# Patient Record
Sex: Male | Born: 1935 | Race: Black or African American | Hispanic: No | Marital: Married | Smoking: Current every day smoker
Health system: Southern US, Community
[De-identification: ages and names within clinical notes are randomized; demographics above are authoritative.]

## PROBLEM LIST (undated history)

## (undated) DIAGNOSIS — D509 Iron deficiency anemia, unspecified: Secondary | ICD-10-CM

## (undated) DIAGNOSIS — K219 Gastro-esophageal reflux disease without esophagitis: Secondary | ICD-10-CM

## (undated) DIAGNOSIS — I2510 Atherosclerotic heart disease of native coronary artery without angina pectoris: Secondary | ICD-10-CM

## (undated) DIAGNOSIS — I10 Essential (primary) hypertension: Secondary | ICD-10-CM

## (undated) DIAGNOSIS — M479 Spondylosis, unspecified: Secondary | ICD-10-CM

## (undated) DIAGNOSIS — Z9889 Other specified postprocedural states: Secondary | ICD-10-CM

## (undated) DIAGNOSIS — E069 Thyroiditis, unspecified: Secondary | ICD-10-CM

## (undated) DIAGNOSIS — I739 Peripheral vascular disease, unspecified: Secondary | ICD-10-CM

## (undated) DIAGNOSIS — D649 Anemia, unspecified: Secondary | ICD-10-CM

## (undated) DIAGNOSIS — M79606 Pain in leg, unspecified: Secondary | ICD-10-CM

## (undated) DIAGNOSIS — Z9289 Personal history of other medical treatment: Secondary | ICD-10-CM

## (undated) DIAGNOSIS — Z860100 Personal history of colon polyps, unspecified: Secondary | ICD-10-CM

## (undated) DIAGNOSIS — E1049 Type 1 diabetes mellitus with other diabetic neurological complication: Secondary | ICD-10-CM

## (undated) DIAGNOSIS — M7020 Olecranon bursitis, unspecified elbow: Secondary | ICD-10-CM

## (undated) DIAGNOSIS — I639 Cerebral infarction, unspecified: Secondary | ICD-10-CM

## (undated) DIAGNOSIS — N189 Chronic kidney disease, unspecified: Secondary | ICD-10-CM

## (undated) DIAGNOSIS — E7800 Pure hypercholesterolemia, unspecified: Secondary | ICD-10-CM

## (undated) DIAGNOSIS — E079 Disorder of thyroid, unspecified: Secondary | ICD-10-CM

## (undated) DIAGNOSIS — R200 Anesthesia of skin: Secondary | ICD-10-CM

## (undated) HISTORY — DX: Chronic kidney disease, unspecified: N18.9

## (undated) HISTORY — DX: Anesthesia of skin: R20.0

## (undated) HISTORY — DX: Olecranon bursitis, unspecified elbow: M70.20

## (undated) HISTORY — DX: Essential (primary) hypertension: I10

## (undated) HISTORY — DX: Personal history of colon polyps, unspecified: Z86.0100

## (undated) HISTORY — DX: Anemia, unspecified: D64.9

## (undated) HISTORY — DX: Cerebral infarction, unspecified: I63.9

## (undated) HISTORY — DX: Spondylosis, unspecified: M47.9

## (undated) HISTORY — DX: Pain in leg, unspecified: M79.606

## (undated) HISTORY — DX: Atherosclerotic heart disease of native coronary artery without angina pectoris: I25.10

## (undated) HISTORY — DX: Gastro-esophageal reflux disease without esophagitis: K21.9

## (undated) HISTORY — DX: Peripheral vascular disease, unspecified: I73.9

## (undated) HISTORY — DX: Other specified postprocedural states: Z98.890

## (undated) HISTORY — PX: EYE SURGERY: SHX253

## (undated) HISTORY — DX: Iron deficiency anemia, unspecified: D50.9

## (undated) HISTORY — PX: APPENDECTOMY: SHX54

## (undated) HISTORY — DX: Disorder of thyroid, unspecified: E07.9

## (undated) HISTORY — DX: Pure hypercholesterolemia, unspecified: E78.00

## (undated) HISTORY — DX: Personal history of colonic polyps: Z86.010

## (undated) HISTORY — DX: Thyroiditis, unspecified: E06.9

## (undated) HISTORY — DX: Type 1 diabetes mellitus with other diabetic neurological complication: E10.49

---

## 1998-04-30 ENCOUNTER — Ambulatory Visit (HOSPITAL_COMMUNITY): Admission: RE | Admit: 1998-04-30 | Discharge: 1998-04-30 | Payer: Self-pay | Admitting: Gastroenterology

## 2000-04-25 HISTORY — PX: PR VEIN BYPASS GRAFT,AORTO-FEM-POP: 35551

## 2000-04-25 HISTORY — PX: OTHER SURGICAL HISTORY: SHX169

## 2000-10-11 ENCOUNTER — Encounter: Payer: Self-pay | Admitting: *Deleted

## 2000-10-11 ENCOUNTER — Inpatient Hospital Stay (HOSPITAL_COMMUNITY): Admission: RE | Admit: 2000-10-11 | Discharge: 2000-10-13 | Payer: Self-pay | Admitting: *Deleted

## 2000-11-10 ENCOUNTER — Ambulatory Visit (HOSPITAL_COMMUNITY): Admission: RE | Admit: 2000-11-10 | Discharge: 2000-11-10 | Payer: Self-pay | Admitting: *Deleted

## 2000-11-17 ENCOUNTER — Encounter (INDEPENDENT_AMBULATORY_CARE_PROVIDER_SITE_OTHER): Payer: Self-pay | Admitting: Specialist

## 2000-11-17 ENCOUNTER — Inpatient Hospital Stay (HOSPITAL_COMMUNITY): Admission: RE | Admit: 2000-11-17 | Discharge: 2000-11-22 | Payer: Self-pay | Admitting: Vascular Surgery

## 2000-11-17 ENCOUNTER — Encounter: Payer: Self-pay | Admitting: Vascular Surgery

## 2000-11-18 ENCOUNTER — Encounter: Payer: Self-pay | Admitting: Vascular Surgery

## 2001-04-25 DIAGNOSIS — I2510 Atherosclerotic heart disease of native coronary artery without angina pectoris: Secondary | ICD-10-CM

## 2001-04-25 HISTORY — DX: Atherosclerotic heart disease of native coronary artery without angina pectoris: I25.10

## 2002-02-04 ENCOUNTER — Inpatient Hospital Stay (HOSPITAL_COMMUNITY): Admission: EM | Admit: 2002-02-04 | Discharge: 2002-02-06 | Payer: Self-pay | Admitting: Emergency Medicine

## 2002-02-04 ENCOUNTER — Encounter: Payer: Self-pay | Admitting: Internal Medicine

## 2002-02-22 ENCOUNTER — Ambulatory Visit (HOSPITAL_COMMUNITY): Admission: RE | Admit: 2002-02-22 | Discharge: 2002-02-22 | Payer: Self-pay | Admitting: Gastroenterology

## 2002-12-04 ENCOUNTER — Encounter: Admission: RE | Admit: 2002-12-04 | Discharge: 2002-12-04 | Payer: Self-pay | Admitting: Endocrinology

## 2002-12-04 ENCOUNTER — Encounter: Payer: Self-pay | Admitting: Endocrinology

## 2003-04-14 HISTORY — PX: OTHER SURGICAL HISTORY: SHX169

## 2003-09-17 ENCOUNTER — Inpatient Hospital Stay (HOSPITAL_COMMUNITY): Admission: AD | Admit: 2003-09-17 | Discharge: 2003-09-18 | Payer: Self-pay | Admitting: Internal Medicine

## 2004-01-12 ENCOUNTER — Ambulatory Visit (HOSPITAL_COMMUNITY): Admission: RE | Admit: 2004-01-12 | Discharge: 2004-01-12 | Payer: Self-pay | Admitting: Endocrinology

## 2004-06-21 ENCOUNTER — Ambulatory Visit: Payer: Self-pay | Admitting: *Deleted

## 2004-07-08 ENCOUNTER — Ambulatory Visit: Payer: Self-pay | Admitting: Endocrinology

## 2004-07-13 ENCOUNTER — Ambulatory Visit: Payer: Self-pay | Admitting: Endocrinology

## 2004-07-16 ENCOUNTER — Encounter: Admission: RE | Admit: 2004-07-16 | Discharge: 2004-10-14 | Payer: Self-pay | Admitting: Endocrinology

## 2004-07-27 ENCOUNTER — Ambulatory Visit: Payer: Self-pay | Admitting: Endocrinology

## 2004-08-11 ENCOUNTER — Ambulatory Visit: Payer: Self-pay | Admitting: Endocrinology

## 2004-09-10 ENCOUNTER — Ambulatory Visit: Payer: Self-pay | Admitting: Endocrinology

## 2004-10-12 ENCOUNTER — Ambulatory Visit: Payer: Self-pay | Admitting: Endocrinology

## 2004-10-28 ENCOUNTER — Ambulatory Visit: Payer: Self-pay | Admitting: Endocrinology

## 2004-11-11 ENCOUNTER — Ambulatory Visit: Payer: Self-pay | Admitting: Endocrinology

## 2004-12-08 ENCOUNTER — Ambulatory Visit: Payer: Self-pay | Admitting: Endocrinology

## 2005-01-04 ENCOUNTER — Ambulatory Visit: Payer: Self-pay | Admitting: Endocrinology

## 2005-01-10 ENCOUNTER — Ambulatory Visit: Payer: Self-pay | Admitting: Endocrinology

## 2005-01-12 ENCOUNTER — Ambulatory Visit: Payer: Self-pay | Admitting: Cardiology

## 2005-01-20 ENCOUNTER — Ambulatory Visit: Payer: Self-pay | Admitting: Endocrinology

## 2005-01-24 ENCOUNTER — Ambulatory Visit: Payer: Self-pay | Admitting: Endocrinology

## 2005-09-06 ENCOUNTER — Ambulatory Visit: Payer: Self-pay | Admitting: Endocrinology

## 2005-10-11 ENCOUNTER — Ambulatory Visit: Payer: Self-pay | Admitting: Endocrinology

## 2006-01-12 ENCOUNTER — Ambulatory Visit: Payer: Self-pay | Admitting: Endocrinology

## 2006-01-12 HISTORY — PX: ELECTROCARDIOGRAM: SHX264

## 2006-06-22 ENCOUNTER — Ambulatory Visit: Payer: Self-pay | Admitting: Endocrinology

## 2006-07-03 ENCOUNTER — Ambulatory Visit: Payer: Self-pay | Admitting: Endocrinology

## 2006-07-03 LAB — CONVERTED CEMR LAB
BUN: 25 mg/dL — ABNORMAL HIGH (ref 6–23)
Basophils Absolute: 0.1 10*3/uL (ref 0.0–0.1)
CO2: 28 meq/L (ref 19–32)
Calcium: 9 mg/dL (ref 8.4–10.5)
Creatinine, Ser: 1 mg/dL (ref 0.4–1.5)
Eosinophils Relative: 2.2 % (ref 0.0–5.0)
GFR calc non Af Amer: 78 mL/min
Glucose, Bld: 220 mg/dL — ABNORMAL HIGH (ref 70–99)
HCT: 34.4 % — ABNORMAL LOW (ref 39.0–52.0)
Hemoglobin: 11.5 g/dL — ABNORMAL LOW (ref 13.0–17.0)
MCHC: 33.5 g/dL (ref 30.0–36.0)
Monocytes Absolute: 1 10*3/uL — ABNORMAL HIGH (ref 0.2–0.7)
Neutro Abs: 6.2 10*3/uL (ref 1.4–7.7)
Neutrophils Relative %: 70.2 % (ref 43.0–77.0)
Potassium: 4.3 meq/L (ref 3.5–5.1)
Sodium: 142 meq/L (ref 135–145)
WBC: 8.9 10*3/uL (ref 4.5–10.5)

## 2006-07-06 ENCOUNTER — Encounter: Admission: RE | Admit: 2006-07-06 | Discharge: 2006-07-06 | Payer: Self-pay | Admitting: Endocrinology

## 2006-07-12 ENCOUNTER — Ambulatory Visit: Payer: Self-pay | Admitting: Endocrinology

## 2006-08-15 ENCOUNTER — Ambulatory Visit: Payer: Self-pay | Admitting: Endocrinology

## 2006-08-15 LAB — CONVERTED CEMR LAB
Basophils Absolute: 0 10*3/uL (ref 0.0–0.1)
Hemoglobin: 11.4 g/dL — ABNORMAL LOW (ref 13.0–17.0)
Iron: 24 ug/dL — ABNORMAL LOW (ref 42–165)
Lymphocytes Relative: 15.6 % (ref 12.0–46.0)
MCHC: 33 g/dL (ref 30.0–36.0)
Monocytes Absolute: 0.6 10*3/uL (ref 0.2–0.7)
Monocytes Relative: 7.6 % (ref 3.0–11.0)
Neutro Abs: 6.3 10*3/uL (ref 1.4–7.7)
Platelets: 236 10*3/uL (ref 150–400)
RDW: 18.3 % — ABNORMAL HIGH (ref 11.5–14.6)
Transferrin: 282 mg/dL (ref >=212.0)

## 2006-09-05 ENCOUNTER — Ambulatory Visit: Payer: Self-pay | Admitting: Endocrinology

## 2006-10-25 ENCOUNTER — Encounter: Payer: Self-pay | Admitting: Endocrinology

## 2006-10-25 DIAGNOSIS — Z860100 Personal history of colon polyps, unspecified: Secondary | ICD-10-CM | POA: Insufficient documentation

## 2006-10-25 DIAGNOSIS — I739 Peripheral vascular disease, unspecified: Secondary | ICD-10-CM

## 2006-10-25 DIAGNOSIS — K219 Gastro-esophageal reflux disease without esophagitis: Secondary | ICD-10-CM

## 2006-11-18 ENCOUNTER — Encounter: Admission: RE | Admit: 2006-11-18 | Discharge: 2006-11-18 | Payer: Self-pay | Admitting: Neurology

## 2006-12-15 ENCOUNTER — Ambulatory Visit: Payer: Self-pay | Admitting: Endocrinology

## 2006-12-15 LAB — CONVERTED CEMR LAB
Albumin: 3.5 g/dL (ref 3.5–5.2)
Basophils Absolute: 0 10*3/uL (ref 0.0–0.1)
Basophils Relative: 0.4 % (ref 0.0–1.0)
Bilirubin, Direct: 0.1 mg/dL (ref 0.0–0.3)
CO2: 20 meq/L (ref 19–32)
Chloride: 111 meq/L (ref 96–112)
Creatinine, Ser: 1.2 mg/dL (ref 0.4–1.5)
Eosinophils Absolute: 0.2 10*3/uL (ref 0.0–0.6)
GFR calc Af Amer: 76 mL/min
GFR calc non Af Amer: 63 mL/min
HCT: 31.9 % — ABNORMAL LOW (ref 39.0–52.0)
Hemoglobin: 10.4 g/dL — ABNORMAL LOW (ref 13.0–17.0)
Hgb A1c MFr Bld: 7.8 % — ABNORMAL HIGH (ref 4.6–6.0)
Iron: 92 ug/dL (ref 42–165)
Lymphocytes Relative: 19.2 % (ref 12.0–46.0)
MCHC: 32.7 g/dL (ref 30.0–36.0)
MCV: 84.4 fL (ref 78.0–100.0)
Monocytes Absolute: 0.7 10*3/uL (ref 0.2–0.7)
Neutro Abs: 5.2 10*3/uL (ref 1.4–7.7)
Neutrophils Relative %: 68.6 % (ref 43.0–77.0)
Platelets: 199 10*3/uL (ref 150–400)
Potassium: 4.2 meq/L (ref 3.5–5.1)
RBC: 3.78 M/uL — ABNORMAL LOW (ref 4.22–5.81)
Saturation Ratios: 22.9 % (ref 20.0–50.0)
Sodium: 139 meq/L (ref 135–145)
Total Bilirubin: 0.7 mg/dL (ref 0.3–1.2)
Transferrin: 287.1 mg/dL (ref >=212.0)

## 2006-12-29 ENCOUNTER — Ambulatory Visit: Payer: Self-pay | Admitting: Endocrinology

## 2007-01-05 ENCOUNTER — Ambulatory Visit: Payer: Self-pay

## 2007-01-19 ENCOUNTER — Ambulatory Visit: Payer: Self-pay | Admitting: Cardiology

## 2007-01-26 ENCOUNTER — Ambulatory Visit: Payer: Self-pay | Admitting: Endocrinology

## 2007-03-20 ENCOUNTER — Ambulatory Visit: Payer: Self-pay | Admitting: Endocrinology

## 2007-03-20 DIAGNOSIS — I10 Essential (primary) hypertension: Secondary | ICD-10-CM | POA: Insufficient documentation

## 2007-03-20 DIAGNOSIS — E1049 Type 1 diabetes mellitus with other diabetic neurological complication: Secondary | ICD-10-CM

## 2007-03-21 LAB — CONVERTED CEMR LAB
Basophils Absolute: 0 10*3/uL (ref 0.0–0.1)
Eosinophils Absolute: 0.2 10*3/uL (ref 0.0–0.6)
Eosinophils Relative: 2.1 % (ref 0.0–5.0)
Iron: 27 ug/dL — ABNORMAL LOW (ref 42–165)
MCV: 82 fL (ref 78.0–100.0)
Platelets: 224 10*3/uL (ref 150–400)
RBC: 3.77 M/uL — ABNORMAL LOW (ref 4.22–5.81)
Transferrin: 328.7 mg/dL (ref >=212.0)
WBC: 7.3 10*3/uL (ref 4.5–10.5)

## 2007-07-18 ENCOUNTER — Ambulatory Visit: Payer: Self-pay | Admitting: Endocrinology

## 2007-07-18 DIAGNOSIS — D509 Iron deficiency anemia, unspecified: Secondary | ICD-10-CM

## 2007-07-18 LAB — CONVERTED CEMR LAB
Hemoglobin: 12 g/dL — ABNORMAL LOW (ref 13.0–17.0)
Hgb A1c MFr Bld: 7.7 % — ABNORMAL HIGH (ref 4.6–6.0)
Iron: 62 ug/dL (ref 42–165)
Lymphocytes Relative: 17.1 % (ref 12.0–46.0)
Monocytes Relative: 9.4 % (ref 3.0–11.0)
PSA: 1.15 ng/mL (ref 0.10–4.00)
Platelets: 238 10*3/uL (ref 150–400)
RDW: 18.7 % — ABNORMAL HIGH (ref 11.5–14.6)
Saturation Ratios: 18.1 % — ABNORMAL LOW (ref 20.0–50.0)
Transferrin: 244.1 mg/dL (ref >=212.0)

## 2007-09-10 ENCOUNTER — Encounter: Payer: Self-pay | Admitting: Endocrinology

## 2007-09-15 ENCOUNTER — Encounter: Admission: RE | Admit: 2007-09-15 | Discharge: 2007-09-15 | Payer: Self-pay | Admitting: Orthopedic Surgery

## 2007-10-02 ENCOUNTER — Emergency Department (HOSPITAL_COMMUNITY): Admission: EM | Admit: 2007-10-02 | Discharge: 2007-10-02 | Payer: Self-pay | Admitting: Emergency Medicine

## 2007-11-15 ENCOUNTER — Ambulatory Visit: Payer: Self-pay | Admitting: Endocrinology

## 2007-11-15 DIAGNOSIS — F1010 Alcohol abuse, uncomplicated: Secondary | ICD-10-CM | POA: Insufficient documentation

## 2007-11-15 LAB — CONVERTED CEMR LAB
Hgb A1c MFr Bld: 8.6 % — ABNORMAL HIGH (ref 4.6–6.0)
Iron: 32 ug/dL — ABNORMAL LOW (ref 42–165)
Lymphocytes Relative: 22.3 % (ref 12.0–46.0)
Monocytes Absolute: 0.5 10*3/uL (ref 0.1–1.0)
Monocytes Relative: 9.2 % (ref 3.0–12.0)
Platelets: 205 10*3/uL (ref 150–400)
RDW: 16 % — ABNORMAL HIGH (ref 11.5–14.6)
TSH: 2.08 microintl units/mL (ref 0.35–5.50)
Transferrin: 256.4 mg/dL (ref >=212.0)

## 2007-12-25 ENCOUNTER — Ambulatory Visit: Payer: Self-pay | Admitting: Cardiology

## 2008-02-13 ENCOUNTER — Ambulatory Visit: Payer: Self-pay | Admitting: Endocrinology

## 2008-02-13 LAB — CONVERTED CEMR LAB
ALT: 62 units/L — ABNORMAL HIGH (ref 0–53)
Alkaline Phosphatase: 104 units/L (ref 39–117)
Bilirubin, Direct: 0.2 mg/dL (ref 0.0–0.3)
CO2: 25 meq/L (ref 19–32)
Chloride: 110 meq/L (ref 96–112)
Eosinophils Relative: 2.5 % (ref 0.0–5.0)
Glucose, Bld: 132 mg/dL — ABNORMAL HIGH (ref 70–99)
HCT: 38.4 % — ABNORMAL LOW (ref 39.0–52.0)
HDL: 60.7 mg/dL (ref >39.0)
Hemoglobin, Urine: NEGATIVE
Hgb A1c MFr Bld: 9.1 % — ABNORMAL HIGH (ref 4.6–6.0)
LDL Cholesterol: 62 mg/dL (ref 0–99)
Lymphocytes Relative: 24.4 % (ref 12.0–46.0)
Microalb, Ur: 5.7 mg/dL — ABNORMAL HIGH (ref 0.0–1.9)
Monocytes Absolute: 0.9 10*3/uL (ref 0.1–1.0)
Monocytes Relative: 10.6 % (ref 3.0–12.0)
Nitrite: NEGATIVE
Platelets: 173 10*3/uL (ref 150–400)
Potassium: 4.1 meq/L (ref 3.5–5.1)
Saturation Ratios: 35.6 % (ref 20.0–50.0)
Sodium: 141 meq/L (ref 135–145)
Specific Gravity, Urine: 1.02 (ref 1.000–1.03)
Total Bilirubin: 1.1 mg/dL (ref 0.3–1.2)
Total CHOL/HDL Ratio: 2.2
Total Protein, Urine: NEGATIVE mg/dL
Total Protein: 7 g/dL (ref 6.0–8.3)
Transferrin: 246.8 mg/dL (ref >=212.0)
Urine Glucose: NEGATIVE mg/dL
WBC: 8.1 10*3/uL (ref 4.5–10.5)
pH: 5.5 (ref 5.0–8.0)

## 2008-03-14 ENCOUNTER — Ambulatory Visit: Payer: Self-pay | Admitting: Endocrinology

## 2008-03-14 DIAGNOSIS — M7020 Olecranon bursitis, unspecified elbow: Secondary | ICD-10-CM | POA: Insufficient documentation

## 2008-03-24 ENCOUNTER — Encounter: Payer: Self-pay | Admitting: Endocrinology

## 2008-03-25 ENCOUNTER — Encounter: Admission: RE | Admit: 2008-03-25 | Discharge: 2008-03-25 | Payer: Self-pay | Admitting: Endocrinology

## 2008-04-04 ENCOUNTER — Ambulatory Visit: Payer: Self-pay | Admitting: Endocrinology

## 2008-04-04 DIAGNOSIS — E069 Thyroiditis, unspecified: Secondary | ICD-10-CM | POA: Insufficient documentation

## 2008-05-15 ENCOUNTER — Ambulatory Visit: Payer: Self-pay | Admitting: Endocrinology

## 2008-05-15 DIAGNOSIS — R209 Unspecified disturbances of skin sensation: Secondary | ICD-10-CM | POA: Insufficient documentation

## 2008-05-15 DIAGNOSIS — E7800 Pure hypercholesterolemia, unspecified: Secondary | ICD-10-CM

## 2008-05-15 LAB — CONVERTED CEMR LAB
Folate: 14.1 ng/mL
Hgb A1c MFr Bld: 8.2 % — ABNORMAL HIGH (ref 4.6–6.0)
Iron: 68 ug/dL (ref 42–165)

## 2008-08-07 ENCOUNTER — Encounter: Payer: Self-pay | Admitting: Endocrinology

## 2008-08-20 ENCOUNTER — Ambulatory Visit: Payer: Self-pay | Admitting: Endocrinology

## 2008-08-20 DIAGNOSIS — M79609 Pain in unspecified limb: Secondary | ICD-10-CM

## 2008-08-20 DIAGNOSIS — M479 Spondylosis, unspecified: Secondary | ICD-10-CM | POA: Insufficient documentation

## 2008-08-20 LAB — CONVERTED CEMR LAB
Basophils Relative: 0.2 % (ref 0.0–3.0)
Eosinophils Relative: 1.3 % (ref 0.0–5.0)
Hemoglobin: 12.3 g/dL — ABNORMAL LOW (ref 13.0–17.0)
Hgb A1c MFr Bld: 8 % — ABNORMAL HIGH (ref 4.6–6.5)
Iron: 38 ug/dL — ABNORMAL LOW (ref 42–165)
Lymphocytes Relative: 15 % (ref 12.0–46.0)
MCHC: 32.2 g/dL (ref 30.0–36.0)
Neutro Abs: 6.6 10*3/uL (ref 1.4–7.7)
Neutrophils Relative %: 75.6 % (ref 43.0–77.0)
RBC: 4.36 M/uL (ref 4.22–5.81)
Saturation Ratios: 11.9 % — ABNORMAL LOW (ref 20.0–50.0)
Transferrin: 228 mg/dL (ref 212.0–360.0)
WBC: 8.7 10*3/uL (ref 4.5–10.5)

## 2008-08-21 ENCOUNTER — Encounter: Admission: RE | Admit: 2008-08-21 | Discharge: 2008-08-21 | Payer: Self-pay | Admitting: Orthopedic Surgery

## 2008-08-27 ENCOUNTER — Encounter: Payer: Self-pay | Admitting: Endocrinology

## 2008-08-27 ENCOUNTER — Ambulatory Visit: Payer: Self-pay

## 2008-09-05 ENCOUNTER — Telehealth: Payer: Self-pay | Admitting: Endocrinology

## 2008-09-09 ENCOUNTER — Encounter: Payer: Self-pay | Admitting: Endocrinology

## 2008-11-17 ENCOUNTER — Encounter (INDEPENDENT_AMBULATORY_CARE_PROVIDER_SITE_OTHER): Payer: Self-pay | Admitting: *Deleted

## 2008-11-26 ENCOUNTER — Ambulatory Visit: Payer: Self-pay | Admitting: Endocrinology

## 2008-11-26 LAB — CONVERTED CEMR LAB
Basophils Relative: 0.8 % (ref 0.0–3.0)
Eosinophils Relative: 0.9 % (ref 0.0–5.0)
Hgb A1c MFr Bld: 7.6 % — ABNORMAL HIGH (ref 4.6–6.5)
Iron: 151 ug/dL (ref 42–165)
Lymphocytes Relative: 17.2 % (ref 12.0–46.0)
Monocytes Absolute: 0.8 10*3/uL (ref 0.1–1.0)
Monocytes Relative: 10.6 % (ref 3.0–12.0)
Neutrophils Relative %: 70.5 % (ref 43.0–77.0)
Platelets: 202 10*3/uL (ref 150.0–400.0)
RBC: 4.39 M/uL (ref 4.22–5.81)
Saturation Ratios: 48 % (ref 20.0–50.0)
Transferrin: 224.8 mg/dL (ref 212.0–360.0)
WBC: 8 10*3/uL (ref 4.5–10.5)

## 2008-11-27 ENCOUNTER — Telehealth (INDEPENDENT_AMBULATORY_CARE_PROVIDER_SITE_OTHER): Payer: Self-pay | Admitting: *Deleted

## 2008-12-18 DIAGNOSIS — I2510 Atherosclerotic heart disease of native coronary artery without angina pectoris: Secondary | ICD-10-CM | POA: Insufficient documentation

## 2008-12-19 ENCOUNTER — Ambulatory Visit: Payer: Self-pay | Admitting: Cardiovascular Disease

## 2008-12-19 DIAGNOSIS — F17200 Nicotine dependence, unspecified, uncomplicated: Secondary | ICD-10-CM | POA: Insufficient documentation

## 2009-01-13 ENCOUNTER — Telehealth (INDEPENDENT_AMBULATORY_CARE_PROVIDER_SITE_OTHER): Payer: Self-pay | Admitting: *Deleted

## 2009-01-23 DIAGNOSIS — I739 Peripheral vascular disease, unspecified: Secondary | ICD-10-CM

## 2009-01-23 HISTORY — DX: Peripheral vascular disease, unspecified: I73.9

## 2009-01-27 ENCOUNTER — Ambulatory Visit: Payer: Self-pay | Admitting: Vascular Surgery

## 2009-01-27 ENCOUNTER — Encounter: Payer: Self-pay | Admitting: Endocrinology

## 2009-02-05 ENCOUNTER — Ambulatory Visit: Payer: Self-pay | Admitting: Surgery

## 2009-02-05 ENCOUNTER — Ambulatory Visit (HOSPITAL_COMMUNITY): Admission: RE | Admit: 2009-02-05 | Discharge: 2009-02-05 | Payer: Self-pay | Admitting: Surgery

## 2009-02-09 ENCOUNTER — Encounter: Payer: Self-pay | Admitting: Endocrinology

## 2009-02-12 ENCOUNTER — Inpatient Hospital Stay (HOSPITAL_COMMUNITY): Admission: EM | Admit: 2009-02-12 | Discharge: 2009-02-25 | Payer: Self-pay | Admitting: Emergency Medicine

## 2009-02-12 ENCOUNTER — Telehealth: Payer: Self-pay | Admitting: Endocrinology

## 2009-02-28 ENCOUNTER — Emergency Department (HOSPITAL_COMMUNITY): Admission: EM | Admit: 2009-02-28 | Discharge: 2009-02-28 | Payer: Self-pay | Admitting: Family Medicine

## 2009-03-03 ENCOUNTER — Ambulatory Visit: Payer: Self-pay | Admitting: Endocrinology

## 2009-03-03 ENCOUNTER — Ambulatory Visit: Admission: RE | Admit: 2009-03-03 | Discharge: 2009-03-03 | Payer: Self-pay | Admitting: Endocrinology

## 2009-03-03 ENCOUNTER — Ambulatory Visit: Payer: Self-pay | Admitting: Vascular Surgery

## 2009-03-03 ENCOUNTER — Encounter: Payer: Self-pay | Admitting: Endocrinology

## 2009-03-03 DIAGNOSIS — R609 Edema, unspecified: Secondary | ICD-10-CM

## 2009-03-03 LAB — CONVERTED CEMR LAB
Basophils Absolute: 0 10*3/uL (ref 0.0–0.1)
Calcium: 7 mg/dL — ABNORMAL LOW (ref 8.4–10.5)
Eosinophils Relative: 1 % (ref 0.0–5.0)
GFR calc non Af Amer: 105.42 mL/min (ref >60)
Glucose, Bld: 189 mg/dL — ABNORMAL HIGH (ref 70–99)
Hemoglobin: 10.3 g/dL — ABNORMAL LOW (ref 13.0–17.0)
Hgb A1c MFr Bld: 8.1 % — ABNORMAL HIGH (ref 4.6–6.5)
Iron: 14 ug/dL — ABNORMAL LOW (ref 42–165)
Lymphocytes Relative: 8.9 % — ABNORMAL LOW (ref 12.0–46.0)
Monocytes Relative: 5.5 % (ref 3.0–12.0)
Neutro Abs: 6.4 10*3/uL (ref 1.4–7.7)
Potassium: 2.6 meq/L — CL (ref 3.5–5.1)
Pro B Natriuretic peptide (BNP): 412 pg/mL — ABNORMAL HIGH (ref 0.0–100.0)
RDW: 14.5 % (ref 11.5–14.6)
Saturation Ratios: 7.1 % — ABNORMAL LOW (ref 20.0–50.0)
Sodium: 142 meq/L (ref 135–145)
WBC: 7.6 10*3/uL (ref 4.5–10.5)

## 2009-03-04 ENCOUNTER — Encounter: Payer: Self-pay | Admitting: Endocrinology

## 2009-03-06 ENCOUNTER — Ambulatory Visit: Payer: Self-pay | Admitting: Endocrinology

## 2009-03-06 ENCOUNTER — Encounter: Payer: Self-pay | Admitting: Internal Medicine

## 2009-03-06 DIAGNOSIS — E211 Secondary hyperparathyroidism, not elsewhere classified: Secondary | ICD-10-CM | POA: Insufficient documentation

## 2009-03-06 DIAGNOSIS — E876 Hypokalemia: Secondary | ICD-10-CM | POA: Insufficient documentation

## 2009-03-06 LAB — CONVERTED CEMR LAB
BUN: 7 mg/dL (ref 6–23)
Creatinine, Ser: 0.8 mg/dL (ref 0.4–1.5)
GFR calc non Af Amer: 120.76 mL/min (ref >60)

## 2009-03-10 LAB — CONVERTED CEMR LAB: Alcohol, Ethyl (B): <10 mg/dL (ref 0–10)

## 2009-03-30 ENCOUNTER — Ambulatory Visit: Payer: Self-pay | Admitting: Endocrinology

## 2009-03-30 LAB — CONVERTED CEMR LAB
BUN: 16 mg/dL (ref 6–23)
Basophils Relative: 2.5 % (ref 0.0–3.0)
Chloride: 106 meq/L (ref 96–112)
Creatinine, Ser: 1.3 mg/dL (ref 0.4–1.5)
Eosinophils Absolute: 0.2 10*3/uL (ref 0.0–0.7)
GFR calc non Af Amer: 68.95 mL/min (ref >60)
HCT: 37 % — ABNORMAL LOW (ref 39.0–52.0)
Hemoglobin: 12.1 g/dL — ABNORMAL LOW (ref 13.0–17.0)
Hgb A1c MFr Bld: 7.6 % — ABNORMAL HIGH (ref 4.6–6.5)
Lymphs Abs: 1.4 10*3/uL (ref 0.7–4.0)
MCHC: 32.8 g/dL (ref 30.0–36.0)
MCV: 88.4 fL (ref 78.0–100.0)
Monocytes Absolute: 0.4 10*3/uL (ref 0.1–1.0)
Neutro Abs: 4.9 10*3/uL (ref 1.4–7.7)
RBC: 4.18 M/uL — ABNORMAL LOW (ref 4.22–5.81)

## 2009-04-03 ENCOUNTER — Telehealth: Payer: Self-pay | Admitting: Endocrinology

## 2009-04-10 ENCOUNTER — Telehealth (INDEPENDENT_AMBULATORY_CARE_PROVIDER_SITE_OTHER): Payer: Self-pay | Admitting: *Deleted

## 2009-04-29 ENCOUNTER — Telehealth: Payer: Self-pay | Admitting: Endocrinology

## 2009-05-01 ENCOUNTER — Encounter: Payer: Self-pay | Admitting: Endocrinology

## 2009-05-01 ENCOUNTER — Telehealth (INDEPENDENT_AMBULATORY_CARE_PROVIDER_SITE_OTHER): Payer: Self-pay | Admitting: *Deleted

## 2009-06-29 ENCOUNTER — Ambulatory Visit: Payer: Self-pay | Admitting: Endocrinology

## 2009-06-29 DIAGNOSIS — R05 Cough: Secondary | ICD-10-CM

## 2009-06-29 LAB — CONVERTED CEMR LAB
Calcium, Total (PTH): 8.8 mg/dL (ref 8.4–10.5)
PTH: 105.2 pg/mL — ABNORMAL HIGH (ref 14.0–72.0)

## 2009-07-01 LAB — CONVERTED CEMR LAB
ALT: 114 units/L — ABNORMAL HIGH (ref 0–53)
Basophils Relative: 0.9 % (ref 0.0–3.0)
Bilirubin Urine: NEGATIVE
Bilirubin, Direct: 0.2 mg/dL (ref 0.0–0.3)
Creatinine,U: 62 mg/dL
Eosinophils Relative: 1.6 % (ref 0.0–5.0)
GFR calc non Af Amer: 83.56 mL/min (ref >60)
Glucose, Bld: 204 mg/dL — ABNORMAL HIGH (ref 70–99)
HCT: 40.1 % (ref 39.0–52.0)
Hemoglobin: 12.7 g/dL — ABNORMAL LOW (ref 13.0–17.0)
Hgb A1c MFr Bld: 7.9 % — ABNORMAL HIGH (ref 4.6–6.5)
Iron: 103 ug/dL (ref 42–165)
Ketones, ur: NEGATIVE mg/dL
Leukocytes, UA: NEGATIVE
Microalb Creat Ratio: 158.1 mg/g — ABNORMAL HIGH (ref 0.0–30.0)
Microalb, Ur: 9.8 mg/dL — ABNORMAL HIGH (ref 0.0–1.9)
Monocytes Relative: 9.6 % (ref 3.0–12.0)
PSA: 1.04 ng/mL (ref 0.10–4.00)
Potassium: 3.9 meq/L (ref 3.5–5.1)
RDW: 15 % — ABNORMAL HIGH (ref 11.5–14.6)
Sodium: 138 meq/L (ref 135–145)
Total Bilirubin: 1.1 mg/dL (ref 0.3–1.2)
Transferrin: 230.2 mg/dL (ref 212.0–360.0)
Urobilinogen, UA: 0.2 (ref 0.0–1.0)
VLDL: 13 mg/dL (ref 0.0–40.0)

## 2009-07-23 ENCOUNTER — Telehealth: Payer: Self-pay | Admitting: Endocrinology

## 2009-09-03 ENCOUNTER — Encounter: Payer: Self-pay | Admitting: Endocrinology

## 2009-09-23 ENCOUNTER — Telehealth (INDEPENDENT_AMBULATORY_CARE_PROVIDER_SITE_OTHER): Payer: Self-pay | Admitting: *Deleted

## 2009-09-28 ENCOUNTER — Ambulatory Visit: Payer: Self-pay | Admitting: Endocrinology

## 2009-09-28 DIAGNOSIS — K769 Liver disease, unspecified: Secondary | ICD-10-CM | POA: Insufficient documentation

## 2009-09-28 LAB — CONVERTED CEMR LAB
ALT: 43 units/L (ref 0–53)
Albumin: 4.3 g/dL (ref 3.5–5.2)
Alkaline Phosphatase: 90 units/L (ref 39–117)
HCV Ab: NEGATIVE
PTH: 34.7 pg/mL (ref 14.0–72.0)
Total Protein: 7.2 g/dL (ref 6.0–8.3)

## 2009-09-30 ENCOUNTER — Encounter: Payer: Self-pay | Admitting: Endocrinology

## 2009-12-13 ENCOUNTER — Encounter: Payer: Self-pay | Admitting: Endocrinology

## 2009-12-29 ENCOUNTER — Ambulatory Visit: Payer: Self-pay | Admitting: Endocrinology

## 2009-12-29 ENCOUNTER — Telehealth (INDEPENDENT_AMBULATORY_CARE_PROVIDER_SITE_OTHER): Payer: Self-pay | Admitting: *Deleted

## 2009-12-29 DIAGNOSIS — M545 Low back pain, unspecified: Secondary | ICD-10-CM | POA: Insufficient documentation

## 2009-12-29 LAB — CONVERTED CEMR LAB
Eosinophils Relative: 3.6 % (ref 0.0–5.0)
HCT: 42.2 % (ref 39.0–52.0)
Hemoglobin: 13.6 g/dL (ref 13.0–17.0)
Iron: 81 ug/dL (ref 42–165)
Lymphs Abs: 1.8 10*3/uL (ref 0.7–4.0)
MCV: 88.9 fL (ref 78.0–100.0)
Monocytes Absolute: 0.8 10*3/uL (ref 0.1–1.0)
Monocytes Relative: 10 % (ref 3.0–12.0)
Neutro Abs: 5.1 10*3/uL (ref 1.4–7.7)
Platelets: 143 10*3/uL — ABNORMAL LOW (ref 150.0–400.0)
WBC: 8 10*3/uL (ref 4.5–10.5)

## 2010-01-11 ENCOUNTER — Telehealth: Payer: Self-pay | Admitting: Endocrinology

## 2010-01-26 ENCOUNTER — Ambulatory Visit: Payer: Self-pay | Admitting: Cardiovascular Disease

## 2010-01-26 DIAGNOSIS — I7389 Other specified peripheral vascular diseases: Secondary | ICD-10-CM

## 2010-01-26 DIAGNOSIS — I2510 Atherosclerotic heart disease of native coronary artery without angina pectoris: Secondary | ICD-10-CM

## 2010-02-01 ENCOUNTER — Telehealth: Payer: Self-pay | Admitting: Endocrinology

## 2010-02-09 ENCOUNTER — Encounter: Payer: Self-pay | Admitting: Cardiovascular Disease

## 2010-02-09 ENCOUNTER — Ambulatory Visit: Payer: Self-pay | Admitting: Vascular Surgery

## 2010-02-26 ENCOUNTER — Telehealth: Payer: Self-pay | Admitting: Endocrinology

## 2010-03-30 ENCOUNTER — Ambulatory Visit: Payer: Self-pay | Admitting: Endocrinology

## 2010-03-30 LAB — CONVERTED CEMR LAB: Hgb A1c MFr Bld: 8.2 % — ABNORMAL HIGH (ref 4.6–6.5)

## 2010-05-16 ENCOUNTER — Encounter: Payer: Self-pay | Admitting: Gastroenterology

## 2010-05-18 DIAGNOSIS — I739 Peripheral vascular disease, unspecified: Secondary | ICD-10-CM | POA: Insufficient documentation

## 2010-05-18 DIAGNOSIS — I2510 Atherosclerotic heart disease of native coronary artery without angina pectoris: Secondary | ICD-10-CM | POA: Insufficient documentation

## 2010-05-25 NOTE — Assessment & Plan Note (Signed)
Summary: 3 MTH FU--STC   Vital Signs:  Patient profile:   77 year old male Height:      74 inches (187.96 cm) Weight:      145.4 pounds (66.09 kg) BMI:     18.74 O2 Sat:      98 % on Room air Temp:     97.6 degrees F (36.44 degrees C) oral Pulse rate:   81 / minute BP sitting:   132 / 62  (left arm) Cuff size:   regular  Vitals Entered By: Orlan Leavens (September 28, 2009 9:54 AM)  O2 Flow:  Room air CC: 3 month follow-up Is Patient Diabetic? Yes Did you bring your meter with you today? No   Referring Provider:  Romero Belling Primary Provider:  Minus Breeding MD  CC:  3 month follow-up.  History of Present Illness: the status of at least 3 ongoing medical problems is addressed today: secondary hyperthyroidism:  pt says he takes rocaltrol 0.5 micrograms once daily as rx'ed.  he has muscle weakness, and a few cramps.   dm:  no cbg record, but states cbg's are well-controlled.  no hypoglycemia.  he says it is in general, it is higher in am than the afternoon.   elevated lft:  pt says he is unaware of having had any liver condition.  denies abdominal pain.  Current Medications (verified): 1)  Atenolol 25 Mg  Tabs (Atenolol) .... Take 1 By Mouth Qd 2)  Vytorin 10-80 Mg  Tabs (Ezetimibe-Simvastatin) .... Take 1 By Mouth Qd 3)  Aggrenox 25-200 Mg  Cp12 (Aspirin-Dipyridamole) .... Take 1 By Mouth Bid 4)  Onetouch Ultra Test   Strp (Glucose Blood) .... Use As Directed 5)  Humalog Mix 75/25 Kwikpen 75-25 % Susp (Insulin Lispro Prot & Lispro) .Marland Kitchen.. 11 Units Qam   8 Units Qpm 6)  Tramadol Hcl 50 Mg Tabs (Tramadol Hcl) .Marland Kitchen.. 1 Q4h As Needed Pain 7)  Viagra 100 Mg Tabs (Sildenafil Citrate) .... As Needed Use 8)  Hydrochlorothiazide 25 Mg Tabs (Hydrochlorothiazide) .... One By Mouth Once Daily 9)  Bd Pen Needle Short U/f 31g X 8 Mm Misc (Insulin Pen Needle) .... Bid 10)  Calcitriol 0.5 Mcg Caps (Calcitriol) .Marland Kitchen.. 1 Once Daily  Allergies (verified): 1)  ! * Plavix  Past History:  Past Medical  History: Last updated: 12/18/2008 CAD, UNSPECIFIED SITE (ICD-414.00) PERIPHERAL VASCULAR DISEASE (ICD-443.9) LEG PAIN (ICD-729.5) NUMBNESS (ICD-782.0) HYPERTENSION (ICD-401.9) HYPERCHOLESTEROLEMIA (ICD-272.0) OSTEOARTHRITIS, SPINE (ICD-721.90) UNSPECIFIED THYROIDITIS (ICD-245.9) OLECRANON BURSITIS (ICD-726.33) ENCOUNTER FOR LONG-TERM USE OF OTHER MEDICATIONS (ICD-V58.69) ALCOHOL USE (ICD-305.00) SCREENING FOR UNSPECIFIED MALIGNANT NEOPLASM (ICD-V76.9) ANEMIA, IRON DEFICIENCY (ICD-280.9) DIABETES MELLITUS, TYPE I, WITH NEUROLOGICAL COMPLICATIONS (ICD-250.61) GERD (ICD-530.81) COLONIC POLYPS, HX OF (ICD-V12.72)    Review of Systems  The patient denies weight loss and weight gain.    Physical Exam  General:  elderly, frail, no distress  Pulses:  dorsalis pedis intact bilat.   Extremities:  no deformity.  no ulcer on the feet.  feet are of normal color and temp.  no edema  Neurologic:  cn 2-12 grossly intact.   readily moves all 4's.   sensation is intact to touch on the feet  Additional Exam:  Hemoglobin A1C       [H]  7.4 %                       4.6-6.5        Total Bilirubin  1.0 mg/dL                   1.6-1.0   Direct Bilirubin          0.2 mg/dL                   9.6-0.4   Alkaline Phosphatase      90 U/L                      39-117   AST                       37 U/L                      0-37   ALT                       43 U/L                      0-53   Total Protein             7.2 g/dL                    5.4-0.9   Albumin                   4.3 g/dL       Impression & Recommendations:  Problem # 1:  HYPERPARATHYROIDISM, SECONDARY (ICD-252.02)  Problem # 2:  UNSPECIFIED DISORDER OF LIVER (ICD-573.9) Assessment: Improved uncertain etiology  Problem # 3:  DIABETES MELLITUS, TYPE I, WITH NEUROLOGICAL COMPLICATIONS (ICD-250.61) this is the best control this pt should aim for, given this regimen, which does match insulin to his changing needs throughout  the day  Other Orders: T-Hepatitis C Antibody (81191-47829) T-Hepatitis B Surface Antigen (56213-08657) T-Parathyroid Hormone, Intact w/ Calcium (84696-29528) TLB-A1C / Hgb A1C (Glycohemoglobin) (83036-A1C) TLB-Hepatic/Liver Function Pnl (80076-HEPATIC) Est. Patient Level IV (41324)  Patient Instructions: 1)  blood tests are being ordered for you today.  please call [phone] to hear your test results. 2)  pending the test results, please continue the same medications for now. 3)  Please schedule a follow-up appointment in 3 months. 4)  (update: i left message on phone-tree:  rx as we discussed)

## 2010-05-25 NOTE — Progress Notes (Signed)
  Phone Note Refill Request Message from:  Fax from Pharmacy on July 23, 2009 9:02 AM  Refills Requested: Medication #1:  ATENOLOL 25 MG  TABS (ATENOLOL) TAKE 1 by mouth QD   Dosage confirmed as above?Dosage Confirmed RX sent to [facility]  Initial call taken by: Josph Macho RMA,  July 23, 2009 9:02 AM    Prescriptions: ATENOLOL 25 MG  TABS (ATENOLOL) TAKE 1 by mouth QD  #90 x 1   Entered by:   Josph Macho RMA   Authorized by:   Minus Breeding MD   Signed by:   Josph Macho RMA on 07/23/2009   Method used:   Faxed to ...       [facility]* (mail-order)       [address], [address], [place]  [address]       Ph: [phone]       Fax: [phone]   RxID:   [id number]

## 2010-05-25 NOTE — Progress Notes (Signed)
  Phone Note Refill Request Message from:  Fax from Pharmacy on April 29, 2009 11:58 AM  Refills Requested: Medication #1:  ONETOUCH ULTRA TEST   STRP use as directed   Dosage confirmed as above?Dosage Confirmed Initial call taken by: Josph Macho CMA,  April 29, 2009 11:58 AM    Prescriptions: [place] TEST   STRP (GLUCOSE BLOOD) use as directed  #100 Each x 5   Entered by:   Josph Macho CMA   Authorized by:   Minus Breeding MD   Signed by:   Josph Macho CMA on 04/29/2009   Method used:   [facility]  [address] [phone][facility] (retail[facility]       [address]       [place], [place]  [id number]       Ph: [phone] or [phone]       Fax: [phone]   RxID:   [id number]

## 2010-05-25 NOTE — Assessment & Plan Note (Signed)
Summary: 3 MTH YEARLY--STC   Vital Signs:  Patient profile:   77 year old male Height:      74 inches (187.96 cm) Weight:      146.50 pounds (66.59 kg) Temp:     96.7 degrees F (35.94 degrees C) oral Pulse rate:   55 / minute BP sitting:   162 / 84  (left arm) Cuff size:   regular  Vitals Entered By: Josph Macho RMA (June 29, 2009 9:23 AM) CC: 3 month yearly/ I couldn't get pts oxygen/ CF Is Patient Diabetic? Yes   Referring Provider:  Romero Belling Primary Provider:  Minus Breeding MD  CC:  3 month yearly/ I couldn't get pts oxygen/ CF.  History of Present Illness: here for regular wellness examination.  He's feeling pretty well in general, and does not drink etoh.   Current Medications (verified): 1)  Atenolol 25 Mg  Tabs (Atenolol) .... Take 1 By Mouth Qd 2)  Vytorin 10-80 Mg  Tabs (Ezetimibe-Simvastatin) .... Take 1 By Mouth Qd 3)  Aggrenox 25-200 Mg  Cp12 (Aspirin-Dipyridamole) .... Take 1 By Mouth Bid 4)  Onetouch Ultra Test   Strp (Glucose Blood) .... Use As Directed 5)  Humalog Mix 75/25 Kwikpen 75-25 % Susp (Insulin Lispro Prot & Lispro) .Marland Kitchen.. 11 Units Qam   8 Units Qpm 6)  Tramadol Hcl 50 Mg Tabs (Tramadol Hcl) .Marland Kitchen.. 1 Q4h As Needed Pain 7)  Viagra 100 Mg Tabs (Sildenafil Citrate) .... As Needed Use 8)  Hydrochlorothiazide 25 Mg Tabs (Hydrochlorothiazide) .... One By Mouth Once Daily 9)  Calcitriol 0.25 Mcg Caps (Calcitriol) .Marland Kitchen.. 1 Qd 10)  Bd Pen Needle Short U/f 31g X 8 Mm Misc (Insulin Pen Needle) .... Bid  Allergies (verified): 1)  ! * Plavix  Family History: Reviewed history from 12/19/2008 and no changes required. Mother deceased at age 17, natural causes Father died from MI at age 61 One sister alive and one is deceased, kidney failure no cancer  Social History: Reviewed history from 12/19/2008 and no changes required. 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         he has an intermittent dry cough  Physical Exam  General:  normal appearance.   Head:  head: no deformity eyes: no periorbital swelling, no proptosis external nose and ears are normal mouth: no lesion seen Neck:  Supple without thyroid enlargement or tenderness.  Lungs:  Clear to auscultation bilaterally. Normal respiratory effort.  Heart:  Regular rate and rhythm without murmurs or gallops noted. Normal S1,S2.   Abdomen:  abdomen is soft, nontender.  no hepatosplenomegaly.   not distended.  no hernia  Rectal:  normal external and internal exam.  heme neg  Msk:  muscle bulk and strength are grossly normal.  no obvious joint swelling.  gait is normal and steady with a cane Extremities:  no deformity.  no ulcer on the feet.  feet are of normal color and temp.  no edema  Neurologic:  cn 2-12 grossly intact.   readily moves all 4's.   sensation is intact to touch on the feet  Skin:  normal texture and temp.  no rash.  not  diaphoretic  Cervical Nodes:  No significant adenopathy.  Psych:  Alert and cooperative; normal mood and affect; normal attention span and concentration.   Additional Exam:  SEPARATE EVALUATION FOLLOWS--EACH PROBLEM HERE IS NEW, NOT RESPONDING TO TREATMENT, OR POSES SIGNIFICANT RISK TO THE PATIENT'S HEALTH: HISTORY OF THE PRESENT ILLNESS: he takes rocaltrol as rx'ed.  no gi sxs he takes bp meds as rx'ed.  he denies dizziness. PAST MEDICAL HISTORY reviewed and up to date today REVIEW OF SYSTEMS: denies syncope PHYSICAL EXAMINATION: dorsalis pedis intact bilat.  no carotid bruit see vs page LAB/XRAY RESULTS: Parathyroid Hormone   [H]  105.2 pg/mL                 14.0-72.0 Calcium                   8.8 mg/dL    IMPRESSION: secondary hyperparathyroidism, needs increased rx dm, needs increased rx htn, needs increased rx PLAN: see instruction sheet    Impression & Recommendations:  Problem # 1:  ROUTINE GENERAL MEDICAL EXAM@HEALTH  CARE FACL (ICD-V70.0)  Medications Added to Medication List This Visit: 1)  Calcitriol 0.5 Mcg Caps (Calcitriol) .Marland Kitchen.. 1 once daily  Other Orders: T-Parathyroid Hormone, Intact w/ Calcium ([address]) T-2 View CXR (71020TC) TLB-Lipid Panel (80061-LIPID) TLB-BMP (Basic Metabolic Panel-BMET) (80048-METABOL) TLB-CBC Platelet - w/Differential (85025-CBCD) TLB-Hepatic/Liver Function Pnl (80076-HEPATIC) TLB-TSH (Thyroid Stimulating Hormone) (84443-TSH) TLB-A1C / Hgb A1C (Glycohemoglobin) (83036-A1C) TLB-Microalbumin/Creat Ratio, Urine (82043-MALB) TLB-PSA (Prostate Specific Antigen) (84153-PSA) TLB-Udip w/ Micro (81001-URINE) TLB-IBC Pnl (Iron/FE;Transferrin) (83550-IBC) Prescription Created Electronically [phone]) Est. Patient Level III (91478) Est. Patient 65& > (29562)  Patient Instructions: 1)  tests are being ordered for you today.  a few days after the test(s), please call [phone] to hear your test results. 2)  pending the test results, please continue the same medications for now 3)  Please schedule a follow-up appointment in 3 months. 4)  we'll follow the blood pressure for now. 5)  (update: i called pt;  increase rocaltrol to 0.5 micrograms/day). 6)  we discussed the recommendations of the preventive services task force Prescriptions: CALCITRIOL 0.5 MCG CAPS (CALCITRIOL) 1 once daily  #30 x 11   Entered and Authorized by:   Minus Breeding MD   Signed by:   Minus Breeding MD on 07/01/2009   Method used:   [facility]        [facility] [address] [phone][facility] (retail)       [address]       [place], [place]  [id number]       Ph:  [phone] or [phone]       Fax: [phone]   RxID:   [id number]

## 2010-05-25 NOTE — Progress Notes (Signed)
----   Converted from flag ---- ---- 12/29/2009 10:13 AM, Edman Circle wrote: appt 10/4 @ 11:45 with Clifton James  ---- 12/29/2009 9:40 AM, Dagoberto Reef wrote: Thanks  ---- 12/29/2009 9:31 AM, Minus Breeding MD wrote: The following orders have been entered for this patient and placed on Admin Hold:  Type:     Referral       Code:   Cardiology Description:   Cardiology Referral Order Date:   12/29/2009   Authorized By:   Minus Breeding MD Order #:   [id number] Clinical Notes:   needs f/u appt dr Clifton James ------------------------------

## 2010-05-25 NOTE — Progress Notes (Signed)
Summary: humalog  Phone Note Refill Request Message from:  Fax from Pharmacy on February 01, 2010 11:20 AM  Refills Requested: Medication #1:  HUMALOG MIX 75/25 KWIKPEN 75-25 % SUSP 10 units qam   9 units qpm   Dosage confirmed as above?Dosage Confirmed  Method Requested: Fax to [facility] Initial call taken by: Brenton Grills MA,  February 01, 2010 11:23 AM    Prescriptions: HUMALOG MIX 75/25 KWIKPEN 75-25 % SUSP (INSULIN LISPRO PROT & LISPRO) 10 units qam   9 units qpm  #3 month x 1   Entered by:   Brenton Grills MA   Authorized by:   Minus Breeding MD   Signed by:   Brenton Grills MA on 02/01/2010   Method used:   Faxed to ...       [facility]* (mail-order)       [address], [address] [place]  [address]       Ph: [phone]       Fax: [phone]   RxID:   [id number]

## 2010-05-25 NOTE — Medication Information (Signed)
Summary: [facility]   Imported By: Lester [place] 12/17/2009 08:15:28  _____________________________________________________________________  External Attachment:    Type:   Image     Comment:   External Document

## 2010-05-25 NOTE — Progress Notes (Signed)
Summary: [facility]  Phone Note Other Incoming   Summary of Call: Received paperwork from [facility].Put on MD's desk. Initial call taken by: Josph Macho CMA,  May 01, 2009 10:38 AM     Appended Document: [facility] Faxed completed paperwork and sent a copy to be scanned.

## 2010-05-25 NOTE — Assessment & Plan Note (Signed)
Summary: f1y/CAD/jml  Medications Added ATENOLOL 25 MG TABS (ATENOLOL) 1 tab once daily        Visit Type:  1 yr f/u Referring Provider:  Romero Belling Primary Provider:  Minus Breeding MD  CC:  no cardiac complaints today.  History of Present Illness: 77 yo AAM with h/o PVD, CAD, HTN, hyperlipidemia, DM here for followup. His last cardiac cath was in October of 2003 and showed moderate disease. No coronary interventions were performed. He has had no chest pain or SOB. He has a previous aortobifemoral bypass performed by Dr. Hart Rochester in 2002 with endarterectomy of the distal aorta as well as the right common femoral and distal external iliac artery. He was seen  as a new patient in my office for evaluation of bilateral lower extremity fatigue in August of 2010. He told me at the initial visit that his legs both felt tired. There was some pain in the left leg with walking. Marland Kitchen He continued  to smoke one pack per day. He had not kept appointments with Dr. Hart Rochester over the prior 5 years.  His non-invasive imaging in May 2010 demonstrated high grade stenosis of the left profunda with heavy calcificaiton in both SFA, decreased TBI bilaterally. We sent him to see Dr. Hart Rochester and an angiogram was arranged. Dr. Myra Gianotti performed the angiogram on October 14th, 2010. He was found to have patent aorto-bifem bypass graft with total occlusion of the left SFA at the ostium and high grade disease in the ostium of the left profunda femoral artery. There was reconstitution at the popliteal with likely high grade disease in the tibial vessels. He was admitted the next week to [facility] with a small bowel obstruction and had lysis of adhesions on October 28th by Dr. Violeta Gelinas. He is not aware of any plans from a vascular standpoint and has not been seen back in the [facility] offie by Dr. Hart Rochester since the angiogram.   He is feeling well overall. No chest pain, SOB, palpitations, near syncope or syncope. His legs are  not painful with walking. He does describe constant fatigue in the legs. His left foot is cold all of the time. He has not noticed any skin changes or ulcerations. He continues to smoke cigarettes.   Current Medications (verified): 1)  Atenolol 25 Mg Tabs (Atenolol) .Marland Kitchen.. 1 Tab Once Daily 2)  Aggrenox 25-200 Mg  Cp12 (Aspirin-Dipyridamole) .... Take 1 By Mouth Bid 3)  Onetouch Ultra Test   Strp (Glucose Blood) .... Use As Directed 4)  Humalog Mix 75/25 Kwikpen 75-25 % Susp (Insulin Lispro Prot & Lispro) .Marland Kitchen.. 10 Units Qam   9 Units Qpm 5)  Viagra 100 Mg Tabs (Sildenafil Citrate) .... As Needed Use 6)  Hydrochlorothiazide 25 Mg Tabs (Hydrochlorothiazide) .... One By Mouth Once Daily 7)  Bd Pen Needle Short U/f 31g X 8 Mm Misc (Insulin Pen Needle) .... Bid 8)  Calcitriol 0.5 Mcg Caps (Calcitriol) .Marland Kitchen.. 1 Once Daily 9)  Hydrocodone-Acetaminophen 10-325 Mg Tabs (Hydrocodone-Acetaminophen) .... 1/2 or 1 Pill Every 4 Hrs As Needed For Pain 10)  Vytorin 10-40 Mg Tabs (Ezetimibe-Simvastatin) .Marland Kitchen.. 1 Tab Once Daily  Allergies: 1)  ! * Plavix  Past History:  Past Medical History: CAD-native vessel, moderate disease by cath 2003. PERIPHERAL VASCULAR DISEASE (ICD-443.9)-angiogram 10/10 by Dr. 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 Collateral flow through the profunda femoral artery.  LEG PAIN (ICD-729.5) NUMBNESS (ICD-782.0) HYPERTENSION (ICD-401.9) HYPERCHOLESTEROLEMIA (  ICD-272.0) OSTEOARTHRITIS, SPINE (ICD-721.90) UNSPECIFIED THYROIDITIS (ICD-245.9) OLECRANON BURSITIS (ICD-726.33) ENCOUNTER FOR LONG-TERM USE OF OTHER MEDICATIONS (ICD-V58.69) ALCOHOL USE (ICD-305.00) SCREENING FOR UNSPECIFIED MALIGNANT NEOPLASM (ICD-V76.9) ANEMIA, IRON DEFICIENCY (ICD-280.9) DIABETES MELLITUS, TYPE I, WITH NEUROLOGICAL COMPLICATIONS (ICD-250.61) GERD (ICD-530.81) COLONIC POLYPS, HX OF (ICD-V12.72)    Social History: Reviewed history  from 12/19/2008 and no changes required. 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 Married, 2 children, 4 grandchildren No illicit drugs Works in yard frequently  Review of Systems  The patient denies fatigue, malaise, fever, weight gain/loss, vision loss, decreased hearing, hoarseness, chest pain, palpitations, shortness of breath, prolonged cough, wheezing, sleep apnea, coughing up blood, abdominal pain, blood in stool, nausea, vomiting, diarrhea, heartburn, incontinence, blood in urine, muscle weakness, joint pain, leg swelling, rash, skin lesions, headache, fainting, dizziness, depression, anxiety, enlarged lymph nodes, easy bruising or bleeding, and environmental allergies.         Weakness in legs, left foot cold. No claudication in right or left leg.   Vital Signs:  Patient profile:   77 year old male Height:      74 inches Weight:      147.8 pounds BMI:     19.04 Pulse rate:   66 / minute Pulse rhythm:   irregular BP sitting:   124 / 70  (left arm) Cuff size:   regular  Vitals Entered By: Danielle Rankin, CMA (January 26, 2010 11:27 AM)  Physical Exam  General:  General: Thin, cachectic NAD HEENT: OP clear, mucus membranes moist SKIN: warm, dry. No ulcerations Psychiatric: Mood and affect normal Neck: No JVD, no carotid bruits, no thyromegaly, no lymphadenopathy. Lungs:Clear bilaterally, no wheezes, rhonci, crackles CV: RRR no murmurs, gallops rubs Abdomen: soft, NT, ND, BS present Extremities: No edema, pulses non-palpable bilateral DP/PT. No ulcerations. Both feet are cool to touch.     EKG  Procedure date:  01/26/2010  Findings:      Sinus rhythm, rate 66 bpm. 1st degree AV block. RBBB. Possible prior septal infarct.   Impression & Recommendations:  Problem # 1:  CAD, NATIVE VESSEL (ICD-414.01) Stable. No angina. Will continue ASA, statin, beta blocker.   His updated medication list for this problem  includes:    Atenolol 25 Mg Tabs (Atenolol) .Marland Kitchen... 1 tab once daily    Aggrenox 25-200 Mg Cp12 (Aspirin-dipyridamole) .Marland Kitchen... Take 1 by mouth bid  Problem # 2:  PVD WITH CLAUDICATION (ICD-443.89) Followed by Dr. Hart Rochester. Lower extremity angiogram in October 2010 with high grade disease in the left profunda femoral artery, occusion of the left SFA. The profunda fills the popliteal by collaterals, compromised by the proximal stensosis. Will have him follow up with Dr. Hart Rochester for further planning. He is not having active claudication in the left leg. Will not order non-invasive imaging today. Dr. Hart Rochester will probably want his own studies in the [facility] office.   Other Orders: [facility] Referral ([facility] Ref) EKG w/ Interpretation (93000)  Patient Instructions: 1)  Your physician recommends that you schedule a follow-up appointment in: 6 months 2)  Your physician recommends that you continue on your current medications as directed. Please refer to the Current Medication list given to you today. 3)  You have been referred to Dr. Betti Cruz with [facility].

## 2010-05-25 NOTE — Letter (Signed)
Summary: Letter  Letter   Imported By: Sherian Rein 05/05/2009 10:32:43  _____________________________________________________________________  External Attachment:    Type:   Image     Comment:   External Document

## 2010-05-25 NOTE — Letter (Signed)
Summary: [name] OD  [name] OD   Imported By: Lester Barataria 09/09/2009 08:50:08  _____________________________________________________________________  External Attachment:    Type:   Image     Comment:   External Document

## 2010-05-25 NOTE — Assessment & Plan Note (Signed)
Summary: 3 MO FU-OYU   Vital Signs:  Patient profile:   78 year old male Height:      74 inches (187.96 cm) Weight:      148 pounds (67.27 kg) BMI:     19.07 O2 Sat:      97 % on Room air Temp:     96.8 degrees F (36 degrees C) oral Pulse rate:   47 / minute BP sitting:   120 / 74  (left arm) Cuff size:   regular  Vitals Entered By: Brenton Grills MA (December 29, 2009 9:16 AM)  O2 Flow:  Room air CC: 3 month F/U/aj Is Patient Diabetic? Yes   Referring Provider:  Romero Belling Primary Provider:  Minus Breeding MD  CC:  3 month F/U/aj.  History of Present Illness: the status of at least 3 ongoing medical problems is addressed today: oa:  pt says his chronic low-back pain is worse recently.  tramadol does not help.   coronary artery disease:  no recent chest pain. dm:  no cbg record, but states cbg's are well-controlled.  he seldom has hypoglycemia sxs, and these are before lunch.  cbg then is approx 70.   anemia:  he takes fe 2-bid.  no brbpr.   Current Medications (verified): 1)  Atenolol 25 Mg  Tabs (Atenolol) .... Take 1 By Mouth Qd 2)  Vytorin 10-80 Mg  Tabs (Ezetimibe-Simvastatin) .... Take 1 By Mouth Qd 3)  Aggrenox 25-200 Mg  Cp12 (Aspirin-Dipyridamole) .... Take 1 By Mouth Bid 4)  Onetouch Ultra Test   Strp (Glucose Blood) .... Use As Directed 5)  Humalog Mix 75/25 Kwikpen 75-25 % Susp (Insulin Lispro Prot & Lispro) .Marland Kitchen.. 11 Units Qam   8 Units Qpm 6)  Tramadol Hcl 50 Mg Tabs (Tramadol Hcl) .Marland Kitchen.. 1 Q4h As Needed Pain 7)  Viagra 100 Mg Tabs (Sildenafil Citrate) .... As Needed Use 8)  Hydrochlorothiazide 25 Mg Tabs (Hydrochlorothiazide) .... One By Mouth Once Daily 9)  Bd Pen Needle Short U/f 31g X 8 Mm Misc (Insulin Pen Needle) .... Bid 10)  Calcitriol 0.5 Mcg Caps (Calcitriol) .Marland Kitchen.. 1 Once Daily  Allergies (verified): 1)  ! 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         denies dizziness  Physical Exam  General:  normal appearance.   Msk:  back is nontender gait: slow but steady with a cane Additional Exam:  Hemoglobin A1C       [H]  8.1 %    Iron Saturation           27.2 %   Hemoglobin                13.6 g/dL                   16.1-09.6 Hematocrit                42.2 %     Impression & Recommendations:  Problem # 1:  DIABETES MELLITUS, TYPE I, WITH NEUROLOGICAL  COMPLICATIONS (ICD-250.61) she needs some adjustment in her therapy  Problem # 2:  ANEMIA, IRON DEFICIENCY (ICD-280.9) Assessment: Improved  Problem # 3:  OSTEOARTHRITIS, SPINE (ICD-721.90) pain needs increased rx  Problem # 4:  CAD, UNSPECIFIED SITE (ICD-414.00) Assessment: Unchanged  Medications Added to Medication List This Visit: 1)  Humalog Mix 75/25 Kwikpen 75-25 % Susp (Insulin lispro prot & lispro) .Marland Kitchen.. 10 units qam   9 units qpm 2)  Hydrocodone-acetaminophen 10-325 Mg Tabs (Hydrocodone-acetaminophen) .... 1/2 or 1 pill every 4 hrs as needed for pain 3)  Vytorin 10-40 Mg Tabs (Ezetimibe-simvastatin) .Marland Kitchen.. 1 tab once daily  Other Orders: Cardiology Referral (Cardiology) TLB-A1C / Hgb A1C (Glycohemoglobin) (83036-A1C) TLB-Sedimentation Rate (ESR) (85652-ESR) TLB-CBC Platelet - w/Differential (85025-CBCD) TLB-IBC Pnl (Iron/FE;Transferrin) (83550-IBC) Est. Patient Level IV (09811)  Patient  Instructions: 1)  reduce vytorin to 10/40, 1/day 2)  refer back to dr Clifton James.  you will be called with a day and time for an appointment. 3)  change tramadol to hydrocodone-acetaminophen 1/2 or 1 every 4 hrs as needed for pain. 4)  blood tests are being ordered for you today.  please call [phone] to hear your test results. 5)  pending the test results, please continue the same insulin for now. 6)  Please schedule a follow-up appointment in 3 months. 7)  (update: i left message on phone-tree:  change insulin to 10 units am and 9 units pm). Prescriptions: VYTORIN 10-40 MG TABS (EZETIMIBE-SIMVASTATIN) 1 tab once daily  #90 x 33   Entered and Authorized by:   Minus Breeding MD   Signed by:   Minus Breeding MD on 12/29/2009   Method used:   Electronically to        [facility]* (mail-order)       [address], [address], [place]  [address]       Ph: [phone]       Fax: [phone]   RxID:   [id number] HYDROCODONE-ACETAMINOPHEN 10-325 MG TABS (HYDROCODONE-ACETAMINOPHEN) 1/2 or 1 pill every 4 hrs as needed for pain  #50 x 1   Entered and Authorized by:   Minus Breeding MD   Signed by:   Minus Breeding MD on 12/29/2009   Method used:   Print then Give to Patient   RxID:   [id number]

## 2010-05-25 NOTE — Progress Notes (Signed)
Summary: rx refill req  Phone Note Refill Request Message from:  Fax from Pharmacy on February 26, 2010 8:29 AM  Refills Requested: Medication #1:  HYDROCODONE-ACETAMINOPHEN 10-325 MG TABS 1/2 or 1 pill every 4 hrs as needed for pain   Dosage confirmed as above?Dosage Confirmed   Last Refilled: 01/23/2010   Notes: [facility] fax [phone] Next Appointment Scheduled: 03/30/2010 Initial call taken by: Brenton Grills CMA Duncan Dull),  February 26, 2010 8:30 AM  Follow-up for Phone Call        i printed Follow-up by: Minus Breeding MD,  February 26, 2010 8:42 AM  Additional Follow-up for Phone Call Additional follow up Details #1::        [facility] Additional Follow-up by: Brenton Grills CMA Duncan Dull),  February 26, 2010 8:48 AM    Prescriptions: HYDROCODONE-ACETAMINOPHEN 10-325 MG TABS (HYDROCODONE-ACETAMINOPHEN) 1/2 or 1 pill every 4 hrs as needed for pain  #50 x 1   Entered and Authorized by:   Minus Breeding MD   Signed by:   Minus Breeding MD on 02/26/2010   Method used:   Print then Give to Patient   RxID:   [id number]

## 2010-05-25 NOTE — Progress Notes (Signed)
Summary: [facility]  Phone Note [employer] of Call: Recieved paperwork from [facility] for pen needles. I spoke with pt and he states "do not fill he doesn't need pen needles." Initial call taken by: Josph Macho RMA,  September 23, 2009 8:35 AM

## 2010-05-25 NOTE — Progress Notes (Signed)
Summary: Rx refill req  Phone Note Refill Request Message from:  Pharmacy on January 11, 2010 4:17 PM  Refills Requested: Medication #1:  AGGRENOX 25-200 MG  CP12 TAKE 1 by mouth BID   Dosage confirmed as above?Dosage Confirmed   Supply Requested: 1 year Pharmacy requested Rx, previous was not recieved   Method Requested: Fax to Local Pharmacy Initial call taken by: Margaret Pyle, CMA,  January 11, 2010 4:18 PM    Prescriptions: AGGRENOX 25-200 MG  CP12 (ASPIRIN-DIPYRIDAMOLE) TAKE 1 by mouth BID  #180 Capsule x 2   Entered by:   Margaret Pyle, CMA   Authorized by:   Minus Breeding MD   Signed by:   Margaret Pyle, CMA on 01/11/2010   Method used:   Electronically to        [facility]* (mail-order)       [address], [address] [place]  [address]       Ph: [phone]       Fax: [phone]   RxID:   [id number]

## 2010-05-25 NOTE — Medication Information (Signed)
Summary: [facility]  [facility]   Imported By: Sherian Rein 10/02/2009 09:40:33  _____________________________________________________________________  External Attachment:    Type:   Image     Comment:   External Document

## 2010-05-25 NOTE — Letter (Signed)
Summary: [facility] Office Visit   [facility] Visit   Imported By: Roderic Ovens 03/11/2010 11:17:45  _____________________________________________________________________  External Attachment:    Type:   Image     Comment:   External Document

## 2010-05-27 NOTE — Assessment & Plan Note (Signed)
Summary: 3 mth fu  stc   Vital Signs:  Patient profile:   77 year old male Height:      74 inches (187.96 cm) Weight:      149.25 pounds (67.84 kg) BMI:     19.23 O2 Sat:      88 % on Room air Temp:     97.4 degrees F (36.33 degrees C) oral Pulse rate:   73 / minute Pulse rhythm:   irregular BP sitting:   120 / 68  (left arm) Cuff size:   regular  Vitals Entered By: Brenton Grills CMA Duncan Dull) (March 30, 2010 9:10 AM)  O2 Flow:  Room air CC: 3 month F/U/aj Is Patient Diabetic? Yes   Referring Provider:  Romero Belling Primary Provider:  Minus Breeding MD  CC:  3 month F/U/aj.  History of Present Illness: pt states few mos of moderate pain at the right hand, and assoc numbness.   no cbg record, but states cbg's are well-controlled  Current Medications (verified): 1)  Atenolol 25 Mg Tabs (Atenolol) .Marland Kitchen.. 1 Tab Once Daily 2)  Aggrenox 25-200 Mg  Cp12 (Aspirin-Dipyridamole) .... Take 1 By Mouth Bid 3)  Onetouch Ultra Test   Strp (Glucose Blood) .... Use As Directed 4)  Humalog Mix 75/25 Kwikpen 75-25 % Susp (Insulin Lispro Prot & Lispro) .Marland Kitchen.. 10 Units Qam   9 Units Qpm 5)  Viagra 100 Mg Tabs (Sildenafil Citrate) .... As Needed Use 6)  Hydrochlorothiazide 25 Mg Tabs (Hydrochlorothiazide) .... One By Mouth Once Daily 7)  Bd Pen Needle Short U/f 31g X 8 Mm Misc (Insulin Pen Needle) .... Bid 8)  Calcitriol 0.5 Mcg Caps (Calcitriol) .Marland Kitchen.. 1 Once Daily 9)  Hydrocodone-Acetaminophen 10-325 Mg Tabs (Hydrocodone-Acetaminophen) .... 1/2 or 1 Pill Every 4 Hrs As Needed For Pain 10)  Vytorin 10-40 Mg Tabs (Ezetimibe-Simvastatin) .Marland Kitchen.. 1 Tab Once Daily  Allergies (verified): 1)  ! * Plavix  Past History:  Past Medical History: Last updated: 01/26/2010 CAD-native vessel, moderate disease by cath 2003. PERIPHERAL VASCULAR DISEASE (ICD-443.9)-angiogram 10/10 by Dr. 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  Collateral flow through the profunda femoral artery.  LEG PAIN (ICD-729.5) NUMBNESS (ICD-782.0) HYPERTENSION (ICD-401.9) HYPERCHOLESTEROLEMIA (ICD-272.0) OSTEOARTHRITIS, SPINE (ICD-721.90) UNSPECIFIED THYROIDITIS (ICD-245.9) OLECRANON BURSITIS (ICD-726.33) ENCOUNTER FOR LONG-TERM USE OF OTHER MEDICATIONS (ICD-V58.69) ALCOHOL USE (ICD-305.00) SCREENING FOR UNSPECIFIED MALIGNANT NEOPLASM (ICD-V76.9) ANEMIA, IRON DEFICIENCY (ICD-280.9) DIABETES MELLITUS, TYPE I, WITH NEUROLOGICAL COMPLICATIONS (ICD-250.61) GERD (ICD-530.81) COLONIC POLYPS, HX OF (ICD-V12.72)    Review of Systems  The patient denies hypoglycemia.         there is little if any numbness of the left hand.    Physical Exam  General:  normal appearance.   Pulses:  radials are intact bilat.   Extremities:  hands are normal Neurologic:  sensation is intact to touch on the hands Additional Exam:   Hemoglobin A1C       [H]  8.2 %      Impression & Recommendations:  Problem # 1:  DIABETES MELLITUS, TYPE I, WITH NEUROLOGICAL COMPLICATIONS (ICD-250.61) needs increased rx  Problem # 2:  hand pain prob due to cts we discussed the options on pncv, x ray, ref ortho, or wrist splints.  Medications Added to Medication List This Visit: 1)  Humalog Mix 75/25 Kwikpen 75-25 % Susp (Insulin lispro prot & lispro) .Marland Kitchen.. 11 units qam   9 units qpm  Other Orders: Flu Vaccine 65yrs +  MEDICARE PATIENTS [phone]) Administration Flu vaccine - MCR (G0008) TLB-A1C / Hgb A1C (Glycohemoglobin) (83036-A1C) Est. Patient Level IV (60454)  Patient Instructions: 1)  blood tests are being ordered for you today.  please call [phone] to hear your test results. 2)  pending the test results, please continue the same insulin for now. 3)  Please schedule a follow-up appointment in 3 months. 4)  please let me know if you want to have testing done of your hands. 5)  (update: i left message on phone-tree:  increase insulin to 11 units am and 9 units  pm). Prescriptions: HUMALOG MIX 75/25 KWIKPEN 75-25 % SUSP (INSULIN LISPRO PROT & LISPRO) 10 units qam   9 units qpm  #1 box x 1   Entered and Authorized by:   Minus Breeding MD   Signed by:   Minus Breeding MD on 03/30/2010   Method used:   Print then Give to Patient   RxID:   [id number] HUMALOG MIX 75/25 KWIKPEN 75-25 % SUSP (INSULIN LISPRO PROT & LISPRO) 10 units qam   9 units qpm  #1 box x 1   Entered and Authorized by:   Minus Breeding MD   Signed by:   Minus Breeding MD on 03/30/2010   Method used:   Print then Give to Patient   RxID:   [id number]    Orders Added: 1)  Flu Vaccine 42yrs + MEDICARE PATIENTS [Q2039] 2)  Administration Flu vaccine - MCR [G0008] 3)  TLB-A1C / Hgb A1C (Glycohemoglobin) [83036-A1C] 4)  Est. Patient Level IV [[address]272]  Flu Vaccine Consent Questions     Do you have a history of severe allergic reactions to this vaccine? no    Any prior history of allergic reactions to egg and/or gelatin? no    Do you have a sensitivity to the preservative Thimersol? no    Do you have a past history of Guillan-Barre Syndrome? no    Do you currently have an acute febrile illness? no    Have you ever had a severe reaction to latex? no    Vaccine information given and explained to patient? yes    Are you currently pregnant? No    Lot Number:[id number]   Exp Date:10/23/2010   Site Given  Left Deltoid IM    .[email or web address]

## 2010-06-29 ENCOUNTER — Encounter: Payer: Self-pay | Admitting: Endocrinology

## 2010-06-29 ENCOUNTER — Other Ambulatory Visit: Payer: Self-pay | Admitting: Endocrinology

## 2010-06-29 ENCOUNTER — Other Ambulatory Visit: Payer: Medicare Other

## 2010-06-29 ENCOUNTER — Ambulatory Visit (INDEPENDENT_AMBULATORY_CARE_PROVIDER_SITE_OTHER): Payer: Medicare Other | Admitting: Endocrinology

## 2010-06-29 DIAGNOSIS — Z129 Encounter for screening for malignant neoplasm, site unspecified: Secondary | ICD-10-CM

## 2010-06-29 DIAGNOSIS — E7800 Pure hypercholesterolemia, unspecified: Secondary | ICD-10-CM

## 2010-06-29 DIAGNOSIS — I10 Essential (primary) hypertension: Secondary | ICD-10-CM

## 2010-06-29 DIAGNOSIS — E069 Thyroiditis, unspecified: Secondary | ICD-10-CM

## 2010-06-29 DIAGNOSIS — E876 Hypokalemia: Secondary | ICD-10-CM

## 2010-06-29 DIAGNOSIS — D509 Iron deficiency anemia, unspecified: Secondary | ICD-10-CM

## 2010-06-29 DIAGNOSIS — E1049 Type 1 diabetes mellitus with other diabetic neurological complication: Secondary | ICD-10-CM

## 2010-06-29 DIAGNOSIS — K769 Liver disease, unspecified: Secondary | ICD-10-CM

## 2010-06-29 LAB — CBC WITH DIFFERENTIAL/PLATELET
Basophils Absolute: 0 10*3/uL (ref 0.0–0.1)
Eosinophils Absolute: 0.3 10*3/uL (ref 0.0–0.7)
Lymphocytes Relative: 17.9 % (ref 12.0–46.0)
MCHC: 32.9 g/dL (ref 30.0–36.0)
Monocytes Relative: 8 % (ref 3.0–12.0)
Neutrophils Relative %: 71 % (ref 43.0–77.0)
RBC: 4.61 Mil/uL (ref 4.22–5.81)
RDW: 15.1 % — ABNORMAL HIGH (ref 11.5–14.6)

## 2010-06-29 LAB — PSA: PSA: 2.67 ng/mL (ref 0.10–4.00)

## 2010-06-29 LAB — LIPID PANEL
HDL: 57.8 mg/dL (ref >39.00)
Total CHOL/HDL Ratio: 2
Triglycerides: 121 mg/dL (ref 0.0–149.0)
VLDL: 24.2 mg/dL (ref 0.0–40.0)

## 2010-06-29 LAB — BASIC METABOLIC PANEL
CO2: 24 mEq/L (ref 19–32)
Calcium: 9.3 mg/dL (ref 8.4–10.5)
Creatinine, Ser: 1.7 mg/dL — ABNORMAL HIGH (ref 0.4–1.5)
GFR: 51.47 mL/min — ABNORMAL LOW (ref >60.00)
Glucose, Bld: 165 mg/dL — ABNORMAL HIGH (ref 70–99)

## 2010-06-29 LAB — URINALYSIS, ROUTINE W REFLEX MICROSCOPIC
Bilirubin Urine: NEGATIVE
Ketones, ur: NEGATIVE
Total Protein, Urine: NEGATIVE
Urine Glucose: NEGATIVE
pH: 5.5 (ref 5.0–8.0)

## 2010-06-29 LAB — IBC PANEL
Iron: 80 ug/dL (ref 42–165)
Saturation Ratios: 22.9 % (ref 20.0–50.0)

## 2010-06-29 LAB — HEPATIC FUNCTION PANEL
Alkaline Phosphatase: 86 U/L (ref 39–117)
Bilirubin, Direct: 0.2 mg/dL (ref 0.0–0.3)

## 2010-07-06 NOTE — Assessment & Plan Note (Signed)
Summary: 3 MTH FU--STC   Vital Signs:  Patient profile:   77 year old male Height:      74 inches (187.96 cm) Weight:      151.75 pounds (68.98 kg) BMI:     19.55 O2 Sat:      98 % on Room air Temp:     97.5 degrees F (36.39 degrees C) oral Pulse rate:   60 / minute Pulse rhythm:   regular BP sitting:   132 / 70  (left arm) Cuff size:   regular  Vitals Entered By: Brenton Grills CMA Duncan Dull) (June 29, 2010 9:23 AM)  O2 Flow:  Room air CC: 3 month F/U/refills on Calcitriol and Hydrocodone-APAP/aj Is Patient Diabetic? Yes   Referring Provider:  Romero Belling Primary Provider:  Minus Breeding MD  CC:  3 month F/U/refills on Calcitriol and Hydrocodone-APAP/aj.  History of Present Illness: pt says he takes only 10 units of insulin am and 8 units pm.  no cbg record, but states cbg's are "sometimes a little high."   pt states few weeks of moderate "claw" deformity of the toes of the left foot, and slight assoc pain.  Current Medications (verified): 1)  Atenolol 25 Mg Tabs (Atenolol) .Marland Kitchen.. 1 Tab Once Daily 2)  Aggrenox 25-200 Mg  Cp12 (Aspirin-Dipyridamole) .... Take 1 By Mouth Bid 3)  Onetouch Ultra Test   Strp (Glucose Blood) .... Use As Directed 4)  Humalog Mix 75/25 Kwikpen 75-25 % Susp (Insulin Lispro Prot & Lispro) .Marland Kitchen.. 11 Units Qam   9 Units Qpm 5)  Viagra 100 Mg Tabs (Sildenafil Citrate) .... As Needed Use 6)  Hydrochlorothiazide 25 Mg Tabs (Hydrochlorothiazide) .... One By Mouth Once Daily 7)  Bd Pen Needle Short U/f 31g X 8 Mm Misc (Insulin Pen Needle) .... Bid 8)  Calcitriol 0.5 Mcg Caps (Calcitriol) .Marland Kitchen.. 1 Once Daily 9)  Hydrocodone-Acetaminophen 10-325 Mg Tabs (Hydrocodone-Acetaminophen) .... 1/2 or 1 Pill Every 4 Hrs As Needed For Pain 10)  Vytorin 10-40 Mg Tabs (Ezetimibe-Simvastatin) .Marland Kitchen.. 1 Tab Once Daily  Allergies (verified): 1)  ! * Plavix  Past History:  Past Medical History: Last updated: 01/26/2010 CAD-native vessel, moderate disease by cath  2003. PERIPHERAL VASCULAR DISEASE (ICD-443.9)-angiogram 10/10 by Dr. 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 Collateral flow through the profunda femoral artery.  LEG PAIN (ICD-729.5) NUMBNESS (ICD-782.0) HYPERTENSION (ICD-401.9) HYPERCHOLESTEROLEMIA (ICD-272.0) OSTEOARTHRITIS, SPINE (ICD-721.90) UNSPECIFIED THYROIDITIS (ICD-245.9) OLECRANON BURSITIS (ICD-726.33) ENCOUNTER FOR LONG-TERM USE OF OTHER MEDICATIONS (ICD-V58.69) ALCOHOL USE (ICD-305.00) SCREENING FOR UNSPECIFIED MALIGNANT NEOPLASM (ICD-V76.9) ANEMIA, IRON DEFICIENCY (ICD-280.9) DIABETES MELLITUS, TYPE I, WITH NEUROLOGICAL COMPLICATIONS (ICD-250.61) GERD (ICD-530.81) COLONIC POLYPS, HX OF (ICD-V12.72)    Review of Systems  The patient denies hypoglycemia, weight loss, and weight gain.    Physical Exam  General:  normal appearance.   Pulses:  radials are intact bilat.   Extremities:  there are "claw" deformities of several toes of the left foot.  no ulcer on the feet.  feet are of normal color and temp.  no edema  Neurologic:  sensation is intact to touch on the feet  Additional Exam:  Hemoglobin A1C       [H]  8.3 % BUN                  [H]  38 mg/dL                    1-19 Creatinine           [  H]  1.7 mg/dL      Impression & Recommendations:  Problem # 1:  DIABETES MELLITUS, TYPE I, WITH NEUROLOGICAL COMPLICATIONS (ICD-250.61) therapy limited by noncompliance.  i'll do the best i can.  Problem # 2:  foot deformity new  Problem # 3:  renal insuff slightly worse  Other Orders: T-Parathyroid Hormone, Intact w/ Calcium (82956-21308) TLB-Lipid Panel (80061-LIPID) TLB-BMP (Basic Metabolic Panel-BMET) (80048-METABOL) TLB-CBC Platelet - w/Differential (85025-CBCD) TLB-Hepatic/Liver Function Pnl (80076-HEPATIC) TLB-TSH (Thyroid Stimulating Hormone) (84443-TSH) TLB-A1C / Hgb A1C (Glycohemoglobin) (83036-A1C) TLB-Microalbumin/Creat  Ratio, Urine (82043-MALB) TLB-Udip w/ Micro (81001-URINE) TLB-IBC Pnl (Iron/FE;Transferrin) (83550-IBC) TLB-PSA (Prostate Specific Antigen) (84153-PSA) Est. Patient Level IV (65784)  Patient Instructions: 1)  blood tests are being ordered for you today.  please call [phone] to hear your test results. 2)  pending the test results, please increase insulin to 11 units am and 9 units pm. 3)  call if you want to see a foot doctor.   4)  please schedule a "wellness" appointment. 5)  (update: i left message on phone-tree:  we'll recheck bmet upon ret) Prescriptions: CALCITRIOL 0.5 MCG CAPS (CALCITRIOL) 1 once daily  #30 x 11   Entered and Authorized by:   Minus Breeding MD   Signed by:   Minus Breeding MD on 06/29/2010   Method used:   [id number]       [address]       [place], [place]  [id number]       Ph: [phone] or [phone]       Fax: [phone]   RxID:   [id number] HYDROCODONE-ACETAMINOPHEN 10-325 MG TABS (HYDROCODONE-ACETAMINOPHEN) 1/2 or 1 pill every 4 hrs as needed for pain  #50 x 2   Entered and Authorized by:   Minus Breeding MD   Signed by:   Minus Breeding MD on 06/29/2010   Method used:   Print then Give to Patient   RxID:   [id number]    Orders Added: 1)  T-Parathyroid Hormone, Intact w/ Calcium [37628-31517] 2)  TLB-Lipid Panel [80061-LIPID] 3)  TLB-BMP (Basic Metabolic Panel-BMET) [80048-METABOL] 4)  TLB-CBC Platelet - w/Differential [85025-CBCD] 5)  TLB-Hepatic/Liver Function Pnl [80076-HEPATIC] 6)  TLB-TSH (Thyroid Stimulating Hormone) [84443-TSH] 7)  TLB-A1C / Hgb A1C (Glycohemoglobin) [83036-A1C] 8)  TLB-Microalbumin/Creat Ratio, Urine [82043-MALB] 9)  TLB-Udip w/ Micro [81001-URINE] 10)  TLB-IBC Pnl (Iron/FE;Transferrin) [83550-IBC] 11)  TLB-PSA (Prostate Specific Antigen) [84153-PSA] 12)  Est. Patient Level IV [61607]

## 2010-07-27 ENCOUNTER — Ambulatory Visit (INDEPENDENT_AMBULATORY_CARE_PROVIDER_SITE_OTHER): Payer: Medicare Other | Admitting: Endocrinology

## 2010-07-27 ENCOUNTER — Encounter: Payer: Self-pay | Admitting: Endocrinology

## 2010-07-27 VITALS — BP 122/70 | HR 68 | Temp 97.5°F | Ht 74.0 in | Wt 146.6 lb

## 2010-07-27 DIAGNOSIS — Z0000 Encounter for general adult medical examination without abnormal findings: Secondary | ICD-10-CM

## 2010-07-27 DIAGNOSIS — N289 Disorder of kidney and ureter, unspecified: Secondary | ICD-10-CM

## 2010-07-27 NOTE — Patient Instructions (Addendum)
blood tests are being ordered for you today.  please call [phone] to hear your test results. pending the test results, please continue the same medications for now Please make a follow-up appointment in 3 months please consider these measures for your health:  minimize alcohol.  do not use tobacco products.  have a colonoscopy at least every 10 years from age 75.  keep firearms safely stored.  always use seat belts.  have working smoke alarms in your home.  see an eye doctor and dentist regularly.  never drive under the influence of alcohol or drugs (including prescription drugs).   please let me know what your wishes would be, if artificial life support measures should become necessary.  it is critically important to prevent falling down (keep floor areas well-lit, dry, and free of loose objects). (update: we discussed code status.  pt requests full code, but would not want to be started or maintained on artificial life-support measures if there was not a reasonable chance of recovery). (update: i left message on phone-tree:  Ref nephrol)

## 2010-07-27 NOTE — Progress Notes (Signed)
Subjective:    Patient ID: [name], male    DOB: [birth year], 77 y.o.   MRN: [id number]  HPI Subjective:   Patient here for Medicare annual wellness visit and management of other chronic and acute problems.     Risk factors: advanced age    Roster of Physicians Providing Medical Care to Patient: Cardiol: mcalhany vasc surg: lawson Orthopedics:  Took Optometry: Lorita OfficerKinnie Scales  Activities of Daily Living: In your present state of health, do you have any difficulty performing the following activities?:  Preparing food and eating?: No  Bathing yourself: No  Getting dressed: No  Using the toilet:No  Moving around from place to place: No  In the past year have you fallen or had a near fall?:No    Home Safety: Has smoke detector and wears seat belts. No firearms. No excess sun exposure.  Diet and Exercise  Current exercise habits:  Good per patient.  He mows his own lawn.   Dietary issues discussed: healthy diet   Depression Screen  Q1: Over the past two weeks, have you felt down, depressed or hopeless?no  Q2: Over the past two weeks, have you felt little interest or pleasure in doing things? no   The following portions of the patient's history were reviewed and updated as appropriate: allergies, current medications, past family history, past medical history, past social history, past surgical history and problem list.   Review of Systems  Denies hearing loss, and visual loss Objective:   Vision:  Sees opthalmologist Hearing: grossly normal Rectal/prostate:   refused Msk: pt easily and quickly performs "get-up-and-go" from a sitting position Cognitive Impairment Assessment: cognition, memory and judgment appear normal.  remembers 3/3 at 5 minutes.  excellent recall.  can easily read and write a sentence.  alert and oriented x 3   Assessment:   Medicare wellness utd on preventive parameters    Plan:   During the course of the visit the patient was educated  and counseled about appropriate screening and preventive services including:       Fall prevention    Diabetes screening  Nutrition counseling  Patient Instructions (the written plan) was given to the patient.       Past Medical History  Diagnosis Date  . Hypertension   . Anemia   . PAD (peripheral artery disease) 10/10    Angiogram  Dr Myra Gianotti , severe stenosis left profunda after insertion of aorto-bifemoral  bypass graft-total occulsionof left SFA. Collateral flow through the profunda femoral artery.  Marland Kitchen CAD in native artery 2003    by cath   . Hypercholesteremia   . Thyroid disease   . Thyroiditis, unspecified   . Osteoarthritis of spine   . Olecranon bursitis   . 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 GERD (gastroesophageal reflux disease)   . Diabetes mellitus   . History of colonic polyps   . Iron deficiency anemia, unspecified    Past Surgical History  Procedure Date  . Appendectomy     reports that he has been smoking Cigarettes.  He has a 50 pack-year smoking history. He does not have any smokeless tobacco history on file. He reports that he drinks alcohol. He reports that he does not use illicit drugs. family history includes Kidney disease in his sister. Shx: retired.  Married. Allergies  Allergen Reactions  . Clopidogrel Bisulfate     SEPARATE EVALUATION FOLLOWS--EACH PROBLEM HERE IS NEW, NOT RESPONDING TO TREATMENT, OR POSES SIGNIFICANT  RISK TO THE PATIENT'S HEALTH: HISTORY OF THE PRESENT ILLNESS: Pt's creat continues to climb.  Denies dysuria PAST MEDICAL HISTORY reviewed and up to date today REVIEW OF SYSTEMS: Denies hematuria PHYSICAL EXAMINATION: (see vs page) GENERAL: no distress Ext:  No edema LAB/XRAY RESULTS: Creat is noted IMPRESSION: Renal failure, worsening PLAN: Ref nephrol Review of Systems    Objective:   Physical Exam        Assessment & Plan:

## 2010-07-28 ENCOUNTER — Other Ambulatory Visit (INDEPENDENT_AMBULATORY_CARE_PROVIDER_SITE_OTHER): Payer: Medicare Other

## 2010-07-28 DIAGNOSIS — N289 Disorder of kidney and ureter, unspecified: Secondary | ICD-10-CM

## 2010-07-28 LAB — CLOSTRIDIUM DIFFICILE EIA: C difficile Toxins A+B, EIA: NEGATIVE

## 2010-07-28 LAB — GLUCOSE, CAPILLARY
Glucose-Capillary: 122 mg/dL — ABNORMAL HIGH (ref 70–99)
Glucose-Capillary: 124 mg/dL — ABNORMAL HIGH (ref 70–99)
Glucose-Capillary: 143 mg/dL — ABNORMAL HIGH (ref 70–99)
Glucose-Capillary: 90 mg/dL (ref 70–99)

## 2010-07-28 LAB — BASIC METABOLIC PANEL
BUN: 33 mg/dL — ABNORMAL HIGH (ref 6–23)
BUN: 14 mg/dL (ref 6–23)
BUN: 14 mg/dL (ref 6–23)
Calcium: 8.9 mg/dL (ref 8.4–10.5)
Calcium: 7.2 mg/dL — ABNORMAL LOW (ref 8.4–10.5)
Chloride: 112 mEq/L (ref 96–112)
Creatinine, Ser: 0.96 mg/dL (ref 0.4–1.5)
GFR calc non Af Amer: >60 mL/min (ref >60)
GFR: 42.04 mL/min — ABNORMAL LOW (ref >60.00)
Glucose, Bld: 140 mg/dL — ABNORMAL HIGH (ref 70–99)
Glucose, Bld: 123 mg/dL — ABNORMAL HIGH (ref 70–99)
Potassium: 4.5 mEq/L (ref 3.5–5.1)
Potassium: 3.4 mEq/L — ABNORMAL LOW (ref 3.5–5.1)

## 2010-07-28 LAB — CBC
HCT: 26.9 % — ABNORMAL LOW (ref 39.0–52.0)
HCT: 31.9 % — ABNORMAL LOW (ref 39.0–52.0)
Hemoglobin: 10.9 g/dL — ABNORMAL LOW (ref 13.0–17.0)
Hemoglobin: 9.9 g/dL — ABNORMAL LOW (ref 13.0–17.0)
MCHC: 32.8 g/dL (ref 30.0–36.0)
MCHC: 34.2 g/dL (ref 30.0–36.0)
Platelets: 214 10*3/uL (ref 150–400)
RDW: 15.2 % (ref 11.5–15.5)
RDW: 15.5 % (ref 11.5–15.5)
RDW: 15.6 % — ABNORMAL HIGH (ref 11.5–15.5)

## 2010-07-28 LAB — MAGNESIUM: Magnesium: 1.5 mg/dL (ref 1.5–2.5)

## 2010-07-28 LAB — PHOSPHORUS: Phosphorus: 2.8 mg/dL (ref 2.3–4.6)

## 2010-07-29 DIAGNOSIS — Z0000 Encounter for general adult medical examination without abnormal findings: Secondary | ICD-10-CM | POA: Insufficient documentation

## 2010-07-29 LAB — CROSSMATCH: ABO/RH(D): B POS

## 2010-07-29 LAB — GLUCOSE, CAPILLARY
Glucose-Capillary: 104 mg/dL — ABNORMAL HIGH (ref 70–99)
Glucose-Capillary: 106 mg/dL — ABNORMAL HIGH (ref 70–99)
Glucose-Capillary: 109 mg/dL — ABNORMAL HIGH (ref 70–99)
Glucose-Capillary: 111 mg/dL — ABNORMAL HIGH (ref 70–99)
Glucose-Capillary: 111 mg/dL — ABNORMAL HIGH (ref 70–99)
Glucose-Capillary: 114 mg/dL — ABNORMAL HIGH (ref 70–99)
Glucose-Capillary: 119 mg/dL — ABNORMAL HIGH (ref 70–99)
Glucose-Capillary: 120 mg/dL — ABNORMAL HIGH (ref 70–99)
Glucose-Capillary: 125 mg/dL — ABNORMAL HIGH (ref 70–99)
Glucose-Capillary: 127 mg/dL — ABNORMAL HIGH (ref 70–99)
Glucose-Capillary: 128 mg/dL — ABNORMAL HIGH (ref 70–99)
Glucose-Capillary: 129 mg/dL — ABNORMAL HIGH (ref 70–99)
Glucose-Capillary: 130 mg/dL — ABNORMAL HIGH (ref 70–99)
Glucose-Capillary: 131 mg/dL — ABNORMAL HIGH (ref 70–99)
Glucose-Capillary: 135 mg/dL — ABNORMAL HIGH (ref 70–99)
Glucose-Capillary: 136 mg/dL — ABNORMAL HIGH (ref 70–99)
Glucose-Capillary: 139 mg/dL — ABNORMAL HIGH (ref 70–99)
Glucose-Capillary: 139 mg/dL — ABNORMAL HIGH (ref 70–99)
Glucose-Capillary: 148 mg/dL — ABNORMAL HIGH (ref 70–99)
Glucose-Capillary: 158 mg/dL — ABNORMAL HIGH (ref 70–99)
Glucose-Capillary: 160 mg/dL — ABNORMAL HIGH (ref 70–99)
Glucose-Capillary: 164 mg/dL — ABNORMAL HIGH (ref 70–99)
Glucose-Capillary: 170 mg/dL — ABNORMAL HIGH (ref 70–99)
Glucose-Capillary: 174 mg/dL — ABNORMAL HIGH (ref 70–99)
Glucose-Capillary: 175 mg/dL — ABNORMAL HIGH (ref 70–99)
Glucose-Capillary: 182 mg/dL — ABNORMAL HIGH (ref 70–99)
Glucose-Capillary: 201 mg/dL — ABNORMAL HIGH (ref 70–99)
Glucose-Capillary: 78 mg/dL (ref 70–99)
Glucose-Capillary: 81 mg/dL (ref 70–99)
Glucose-Capillary: 94 mg/dL (ref 70–99)
Glucose-Capillary: 96 mg/dL (ref 70–99)
Glucose-Capillary: 96 mg/dL (ref 70–99)
Glucose-Capillary: 97 mg/dL (ref 70–99)

## 2010-07-29 LAB — CULTURE, BLOOD (ROUTINE X 2): Culture: NO GROWTH

## 2010-07-29 LAB — COMPREHENSIVE METABOLIC PANEL
ALT: 16 U/L (ref 0–53)
ALT: 18 U/L (ref 0–53)
AST: 30 U/L (ref 0–37)
AST: 31 U/L (ref 0–37)
CO2: 24 mEq/L (ref 19–32)
Calcium: 7.9 mg/dL — ABNORMAL LOW (ref 8.4–10.5)
Calcium: 9.2 mg/dL (ref 8.4–10.5)
Chloride: 91 mEq/L — ABNORMAL LOW (ref 96–112)
GFR calc Af Amer: 15 mL/min — ABNORMAL LOW (ref >60)
GFR calc Af Amer: 19 mL/min — ABNORMAL LOW (ref >60)
GFR calc non Af Amer: 16 mL/min — ABNORMAL LOW (ref >60)
Glucose, Bld: 163 mg/dL — ABNORMAL HIGH (ref 70–99)
Sodium: 130 mEq/L — ABNORMAL LOW (ref 135–145)
Sodium: 131 mEq/L — ABNORMAL LOW (ref 135–145)
Total Bilirubin: 0.8 mg/dL (ref 0.3–1.2)
Total Protein: 8.8 g/dL — ABNORMAL HIGH (ref 6.0–8.3)

## 2010-07-29 LAB — CBC
HCT: 33.3 % — ABNORMAL LOW (ref 39.0–52.0)
HCT: 33.9 % — ABNORMAL LOW (ref 39.0–52.0)
HCT: 35.5 % — ABNORMAL LOW (ref 39.0–52.0)
Hemoglobin: 10.9 g/dL — ABNORMAL LOW (ref 13.0–17.0)
Hemoglobin: 11.4 g/dL — ABNORMAL LOW (ref 13.0–17.0)
Hemoglobin: 11.7 g/dL — ABNORMAL LOW (ref 13.0–17.0)
Hemoglobin: 12.6 g/dL — ABNORMAL LOW (ref 13.0–17.0)
Hemoglobin: 13.4 g/dL (ref 13.0–17.0)
MCHC: 32.7 g/dL (ref 30.0–36.0)
MCHC: 32.9 g/dL (ref 30.0–36.0)
MCHC: 33 g/dL (ref 30.0–36.0)
MCHC: 33.2 g/dL (ref 30.0–36.0)
MCHC: 33.5 g/dL (ref 30.0–36.0)
MCHC: 33.5 g/dL (ref 30.0–36.0)
MCV: 88.8 fL (ref 78.0–100.0)
MCV: 89.7 fL (ref 78.0–100.0)
Platelets: 140 10*3/uL — ABNORMAL LOW (ref 150–400)
Platelets: 145 10*3/uL — ABNORMAL LOW (ref 150–400)
Platelets: UNDETERMINED 10*3/uL (ref 150–400)
RBC: 3.82 MIL/uL — ABNORMAL LOW (ref 4.22–5.81)
RBC: 4.54 MIL/uL (ref 4.22–5.81)
RDW: 14.7 % (ref 11.5–15.5)
RDW: 14.9 % (ref 11.5–15.5)
RDW: 14.9 % (ref 11.5–15.5)
RDW: 15 % (ref 11.5–15.5)
RDW: 15.1 % (ref 11.5–15.5)
RDW: 15.3 % (ref 11.5–15.5)
WBC: 9.5 10*3/uL (ref 4.0–10.5)

## 2010-07-29 LAB — BASIC METABOLIC PANEL
BUN: 14 mg/dL (ref 6–23)
BUN: 22 mg/dL (ref 6–23)
BUN: 76 mg/dL — ABNORMAL HIGH (ref 6–23)
BUN: 8 mg/dL (ref 6–23)
BUN: 87 mg/dL — ABNORMAL HIGH (ref 6–23)
CO2: 15 mEq/L — ABNORMAL LOW (ref 19–32)
CO2: 18 mEq/L — ABNORMAL LOW (ref 19–32)
CO2: 19 mEq/L (ref 19–32)
CO2: 20 mEq/L (ref 19–32)
Calcium: 7.6 mg/dL — ABNORMAL LOW (ref 8.4–10.5)
Calcium: 7.7 mg/dL — ABNORMAL LOW (ref 8.4–10.5)
Calcium: 7.7 mg/dL — ABNORMAL LOW (ref 8.4–10.5)
Calcium: 8.2 mg/dL — ABNORMAL LOW (ref 8.4–10.5)
Calcium: 8.3 mg/dL — ABNORMAL LOW (ref 8.4–10.5)
Calcium: 8.5 mg/dL (ref 8.4–10.5)
Chloride: 109 mEq/L (ref 96–112)
Chloride: 109 mEq/L (ref 96–112)
Chloride: 110 mEq/L (ref 96–112)
Chloride: 89 mEq/L — ABNORMAL LOW (ref 96–112)
Creatinine, Ser: 1.02 mg/dL (ref 0.4–1.5)
Creatinine, Ser: 1.19 mg/dL (ref 0.4–1.5)
Creatinine, Ser: 1.35 mg/dL (ref 0.4–1.5)
Creatinine, Ser: 2.22 mg/dL — ABNORMAL HIGH (ref 0.4–1.5)
GFR calc Af Amer: 56 mL/min — ABNORMAL LOW (ref >60)
GFR calc Af Amer: >60 mL/min (ref >60)
GFR calc Af Amer: >60 mL/min (ref >60)
GFR calc Af Amer: >60 mL/min (ref >60)
GFR calc Af Amer: >60 mL/min (ref >60)
GFR calc non Af Amer: 13 mL/min — ABNORMAL LOW (ref >60)
GFR calc non Af Amer: 29 mL/min — ABNORMAL LOW (ref >60)
GFR calc non Af Amer: 59 mL/min — ABNORMAL LOW (ref >60)
GFR calc non Af Amer: >60 mL/min (ref >60)
GFR calc non Af Amer: >60 mL/min (ref >60)
GFR calc non Af Amer: >60 mL/min (ref >60)
GFR calc non Af Amer: >60 mL/min (ref >60)
Glucose, Bld: 117 mg/dL — ABNORMAL HIGH (ref 70–99)
Glucose, Bld: 171 mg/dL — ABNORMAL HIGH (ref 70–99)
Glucose, Bld: 238 mg/dL — ABNORMAL HIGH (ref 70–99)
Glucose, Bld: 83 mg/dL (ref 70–99)
Glucose, Bld: 89 mg/dL (ref 70–99)
Glucose, Bld: 98 mg/dL (ref 70–99)
Potassium: 3.5 mEq/L (ref 3.5–5.1)
Potassium: 4.2 mEq/L (ref 3.5–5.1)
Potassium: 4.3 mEq/L (ref 3.5–5.1)
Potassium: 4.6 mEq/L (ref 3.5–5.1)
Potassium: 4.6 mEq/L (ref 3.5–5.1)
Potassium: 5.6 mEq/L — ABNORMAL HIGH (ref 3.5–5.1)
Sodium: 131 mEq/L — ABNORMAL LOW (ref 135–145)
Sodium: 133 mEq/L — ABNORMAL LOW (ref 135–145)
Sodium: 134 mEq/L — ABNORMAL LOW (ref 135–145)
Sodium: 136 mEq/L (ref 135–145)
Sodium: 139 mEq/L (ref 135–145)
Sodium: 141 mEq/L (ref 135–145)

## 2010-07-29 LAB — DIFFERENTIAL
Basophils Absolute: 0 10*3/uL (ref 0.0–0.1)
Basophils Relative: 0 % (ref 0–1)
Basophils Relative: 0 % (ref 0–1)
Eosinophils Absolute: 0.1 10*3/uL (ref 0.0–0.7)
Eosinophils Relative: 0 % (ref 0–5)
Eosinophils Relative: 1 % (ref 0–5)
Lymphs Abs: 0.7 10*3/uL (ref 0.7–4.0)
Lymphs Abs: 0.8 10*3/uL (ref 0.7–4.0)
Monocytes Absolute: 1.1 10*3/uL — ABNORMAL HIGH (ref 0.1–1.0)
Monocytes Relative: 12 % (ref 3–12)
Neutrophils Relative %: 73 % (ref 43–77)

## 2010-07-29 LAB — URINE MICROSCOPIC-ADD ON

## 2010-07-29 LAB — URINE CULTURE
Colony Count: 45000
Colony Count: NO GROWTH
Culture: NO GROWTH

## 2010-07-29 LAB — POCT I-STAT, CHEM 8
Glucose, Bld: 229 mg/dL — ABNORMAL HIGH (ref 70–99)
HCT: 41 % (ref 39.0–52.0)
Hemoglobin: 13.9 g/dL (ref 13.0–17.0)
Potassium: 3.9 mEq/L (ref 3.5–5.1)
Sodium: 139 mEq/L (ref 135–145)
TCO2: 23 mmol/L (ref 0–100)

## 2010-07-29 LAB — URINALYSIS, ROUTINE W REFLEX MICROSCOPIC
Hgb urine dipstick: NEGATIVE
Protein, ur: 30 mg/dL — AB
Urobilinogen, UA: 0.2 mg/dL (ref 0.0–1.0)

## 2010-07-29 LAB — MAGNESIUM
Magnesium: 1.4 mg/dL — ABNORMAL LOW (ref 1.5–2.5)
Magnesium: 3.4 mg/dL — ABNORMAL HIGH (ref 1.5–2.5)

## 2010-07-29 LAB — PHOSPHORUS
Phosphorus: 2.6 mg/dL (ref 2.3–4.6)
Phosphorus: 5.2 mg/dL — ABNORMAL HIGH (ref 2.3–4.6)

## 2010-07-29 LAB — APTT: aPTT: 40 seconds — ABNORMAL HIGH (ref 24–37)

## 2010-08-03 ENCOUNTER — Encounter: Payer: Self-pay | Admitting: *Deleted

## 2010-09-06 ENCOUNTER — Other Ambulatory Visit: Payer: Self-pay | Admitting: Nephrology

## 2010-09-07 ENCOUNTER — Ambulatory Visit
Admission: RE | Admit: 2010-09-07 | Discharge: 2010-09-07 | Disposition: A | Discharge status: Home / Self Care | Payer: Medicare Other | Source: Ambulatory Visit | Attending: Nephrology | Admitting: Nephrology

## 2010-09-07 NOTE — Assessment & Plan Note (Signed)
[facility]ALTH[facility]ARE                            CARDIOLOGY OFFICE NOTE   NAME:[name]                       MRN:          [id number]  DATE:12/25/2007                            DOB:          Aug 13, 1930    PRIMARY CARE PHYSICIAN:  Steve A. Everardo All, MD.   REASON FOR PRESENTATION:  Coronary disease.   HISTORY OF PRESENT ILLNESS:  The patient returns for followup.  It has  been about a year since I last saw him.  Since I last saw him, he did  have one ER visit, he reports in June for chest pain.  He says they did  not keep him.  It was apparently not a cardiac etiology.  He had never  had chest pain like this before.  He otherwise is not getting any chest  pressure, neck or arm discomfort.  He does try to do some bicycling in  his home.  He does car washing.  He is otherwise somewhat limited by  back and hip pain.  With this level of activity, he does not have the  above symptoms.  He does not have any new shortness of breath.  He does  not have any PND or orthopnea.  He is not having any palpitations or  presyncope or syncope.   PAST MEDICAL HISTORY:  Coronary artery disease (catheterization in 2002  with the left main 20% stenosis, LAD proximal 40% followed by 30%  stenosis, and 60-70% apical stenosis.  There were diagonals with 30-40%  stenosis.  The circumflex had a 60% stenosis after the takeoff of the  first marginal.  The right coronary artery was dominant.  There was  diffuse 50% stenosis in the mid segment followed by total occlusion with  excellent collaterals.  The EF was slightly reduced at 42-46%),  peripheral vascular disease (status post left iliac intervention with  stenting), duodenal and colonic AVMs, iron-deficiency anemia, colonic  adenoma, pancreatitis posterior CP, irritable bowel syndrome,  gastroesophageal reflux disease, noninsulin-dependent diabetes mellitus,  dyslipidemia, appendectomy, inguinal hernia repair, previous alcohol  abuse, and ongoing tobacco abuse.   ALLERGIES:  Intolerance to Plavix, caused a rash.   MEDICATIONS:  1. Multivitamin.  2. Atenolol 50 mg daily.  3. Vytorin 10/80 mg daily.  4. Aggrenox.  5. Insulin.  6. Iron.  7. Gabapentin.   REVIEW OF SYSTEMS:  As stated in the HPI and otherwise negative for  other systems.   PHYSICAL EXAMINATION:  GENERAL:  The patient is in no distress.  VITAL SIGNS:  Blood pressure 150/78, heart rate 68 and regular, body  mass index 19, and weight 153 pounds.  HEENT:  Eyes are unremarkable.  Pupils equal, round, and reactive to  light.  Fundi not visualized.  Oral mucosa unremarkable.  NECK:  No jugular venous distension at 45 degrees, carotid upstrokes  brisk and symmetrical.  No bruits.  No thyromegaly.  LYMPHATICS:  No cervical, axillary, or inguinal adenopathy.  LUNGS:  Decreased breath sounds without wheezing or crackles. No  dullness to percussion.  BACK:  No costovertebral angle tenderness.  CHEST:  Unremarkable.  HEART:  PMI not displaced or sustained.  S1 and S2 within normal limits.  No S3, no S4.  No clicks, no rubs, no murmurs.  ABDOMEN:  Flat, positive bowel sounds, was normal in frequency and  pitch.  No bruits, no rebound, no guarding. No midline pulsatile mass.  No hepatomegaly or splenomegaly.  SKIN:  No rashes.  No nodules.  EXTREMITIES:  2+ femoral pulses, 2+ right femoral, 1+ left femoral  bilateral bruits.  Absent dorsalis pedis and posterior tibialis  bilaterally.  No cyanosis, no, clubbing, no edema.  NEURO:  Oriented to person, place, and time.  Cranial nerves II through  XII grossly intact.  Motor grossly intact throughout.   IMAGING:  EKG, sinus rhythm, rate 65, right bundle branch block, left  axis deviation, left anterior fascicular block, and no change from  previous EKGs.   ASSESSMENT AND PLAN:  1. Coronary disease.  The patient has coronary disease as described      but no new symptoms.  A negative stress perfusion  study except for      an old inferior infarct last year.  Therefore, no further      cardiovascular testing is suggested.  2. Tobacco.  We had a long discussion about this (greater than 3      minutes).  He agrees to try Chantix.  3. Hypertension.  Blood pressure is elevated today than it was in the      last 2 readings in the office.  I am taking the liberty of adding      of hydrochlorothiazide 12.5 mg daily.  4. Dyslipidemia.  I do not see a recent lipid profile.  We will get a      fasting lipid profile on him.  The goal will be an LDL less than      100 and HDL greater than 40.  5. Peripheral vascular disease.  He will follow with risk reduction.      He is not having any symptoms.   FOLLOWUP:  I will see him back in 1 year or sooner if needed.     Steve Rotunda, MD, [facility]  Electronically Signed    [name]/MedQ  DD: 12/25/2007  DT: 12/26/2007  Job #: [id number]   cc:   Steve Signs A. Everardo All, MD

## 2010-09-07 NOTE — Progress Notes (Signed)
Addended by: Margaret Pyle on: 09/07/2010 04:13 PM   Modules accepted: Orders

## 2010-09-07 NOTE — Consult Note (Signed)
NEW PATIENT CONSULTATION   [name], [name]  DOB:  Nov 16, 1930                                       01/27/2009  ZOXWR#:[id number]   The patient is a 77 year old patient known to me from previous  aortobifemoral bypass grafting in 2002 for severe claudication symptoms.  At that time his superficial femoral and distal pulses were patent and  his symptoms were relieved.  Over the last 6-8 months he has developed  left thigh and primarily calf claudication symptoms left worse than  right which limit him to walking about 1 block.  He occasionally will  have numbness in his feet at rest and will occasionally have discomfort  in his left thigh at rest.  His symptoms are primarily with ambulation.  He has been recently evaluated by Dr. Earney Hamburg who felt that he  had severe bilateral superficial femoral and tibial occlusive disease  and was referred back for possible surgical treatment for these  symptomatic lesions.  ABI recently was 0.34 on the left and 0.61 on the  right.   PAST MEDICAL HISTORY:  1. Type 1 diabetes mellitus.  2. Hypertension.  3. Coronary artery disease.  4. Hyperlipidemia.  5. Severe osteoarthritis.  6. Negative for stroke.  7. GERD.   PAST SURGICAL HISTORY:  1. Aortobifemoral bypass in 2002.  2. Appendectomy.   FAMILY HISTORY:  His father died from coronary artery disease at age 89.  Mother died of natural causes at age 90.  Negative for diabetes or  stroke.   FAMILY HISTORY AND SOCIAL HISTORY:  The patient is married, has 2  children, is retired.  He smoked a pack of cigarettes a day for 50+  years.  Does not use alcohol.   REVIEW OF SYSTEMS:  Does have about 15 pounds of weight loss in the past  year due to loss of appetite.  No chest pain, dyspnea on exertion,  productive cough, bronchitis, hemoptysis, does have urinary frequency,  lower extremity symptoms as noted.  No DVT or phlebitis.  Does have  arthritis, muscle pain.  All  other systems negative.  Please see health  history form.   ALLERGIES:  Plavix causes itching.   MEDICATIONS:  Please see health history form.   PHYSICAL EXAM:  Vital signs:  Blood pressure 146/77, heart rate 65,  respirations 14.  General:  He is a healthy-appearing male in no  apparent distress, alert and oriented x3.  Neck:  Supple with 3+ carotid  pulses palpable.  No bruits are audible.  Neurological:  Normal.  No  palpable adenopathy in the neck.  Chest:  Clear to auscultation.  Cardiovascular:  Regular rhythm.  No murmurs.  Abdomen:  Soft, nontender  with no masses.  3+ femoral pulses bilaterally with no popliteal or  distal pulses palpable.  Both feet are slightly cool and pale.  No  infection or ulceration noted.   I reviewed the lower extremity arterial Doppler studies done by Dr.  Clifton James and agree that this patient does have severe superficial  femoral occlusive disease bilaterally.  We have scheduled him for an  angiogram, possible intervention versus surgery on Thursday, September  14 by Dr. Myra Gianotti.  The patient does state that the symptoms are  limiting him significantly and not allowing him to be as active as he  would like so we will  proceed under those guidelines.   Quita Skye Hart Rochester, M.D.  Electronically Signed   JDL/MEDQ  D:  01/27/2009  T:  01/28/2009  Job:  [id number]   cc:   Gregary Signs A. Everardo All, MD  Verne Carrow, MD

## 2010-09-07 NOTE — Assessment & Plan Note (Signed)
OFFICE VISIT   SHAAN, RHOADS  DOB:  07-Jul-1930                                       02/09/2010  ZOXWR#:[id number]   The patient was seen today for lower extremity occlusive disease.  I  evaluated him 1 year ago for severe left thigh and calf claudication  symptoms.  Angiogram was performed Dr. Myra Gianotti which revealed total  occlusion of left superficial femoral artery.  At that time, his walking  was quite limited because of his occlusive disease.  He eventually had  exploratory laparotomy for small bowel obstruction and never returned to  see [place], and recently was seen Dr. Clifton James who referred him back for  further evaluation.  The patient states that now he is able to walk 1/2  to 1 block without any symptoms and then develops some calf symptoms  which are not limiting him.  He has no rest pain, history of nonhealing  ulcers, infection, or other vascular problems he states.   CHRONIC MEDICAL PROBLEMS:  1. Diabetes.  2. Hypertension.  3. Coronary artery disease.  4. Hyperlipidemia.  5. GERD.   SOCIAL HISTORY:  Married, has 2 children, is retired.  Smokes a pack  cigarettes per day for 60 years.  Does not use alcohol.   REVIEW OF SYSTEMS:  Positive for weight loss and anorexia, decreased  visual acuity, arthritis, urinary frequency.  Denies any chest pain,  dyspnea on exertion, chronic bronchitis, hemoptysis, or any pulmonary  problems.  All other systems are negative.   PHYSICAL EXAM:  Blood pressure 112/67, heart rate 81, respirations 14.  General:  Well-developed, well-nourished male in no apparent distress,  alert oriented x3.  HEENT exam is normal for age.  EOMs intact.  Lungs:  Clear to auscultation.  No rhonchi or wheezing.  Cardiovascular exam:  Regular rhythm, no murmurs.  Carotid pulses 3+, no bruits.  Abdomen:  Soft, nontender with no masses.  Musculoskeletal exam is free of major  deformities.  Neurologic exam is normal.  Skin is free  of rashes or  infection.  Lower extremity exam reveals 3+ femoral pulses bilaterally  with no popliteal or distal pulses.  Both feet have good motion and  sensation with no ulcerations.   Today, I ordered lower extremity arterial study which I have reviewed  and interpreted.  His ABI on the right leg is 0.47 and the left leg is  0.26 consistent with left superficial femoral occlusion and severe  stenosis of the origin of left profunda.  I think he is at risk of  occluding the left limb of his aortobifemoral graft but he states that  his symptoms are very mild and not limiting him.  He was no interested  in any procedures at present time.  We will see him back on an annual  basis to follow his status and if his symptoms worsen I think he would  be best treated with left femoral-popliteal bypass graft.     Quita Skye Hart [place], M.D.  Electronically Signed   JDL/MEDQ  D:  02/09/2010  T:  02/10/2010  Job:  [id number]   cc:   Verne Carrow, MD

## 2010-09-07 NOTE — Assessment & Plan Note (Signed)
[facility]                            CARDIOLOGY OFFICE NOTE   NAME:[name], [name]                       MRN:          [id number]  DATE:01/19/2007                            DOB:          Oct 22, 1930    PRIMARY CARE PHYSICIAN:  Sean A. Everardo All, M.D.   REASON FOR PRESENTATION:  Evaluate patient with coronary disease.   HISTORY OF PRESENT ILLNESS:  The patient is a pleasant, 77 year old  gentleman with a history of coronary disease, dating back to 2002.  At  that time, he had an abnormal stress perfusion study and subsequently  had a cardiac catheterization.  This demonstrated his EF was about 42%.  He was found to have left main 20% stenosis, the LAD had proximal 40%,  followed by 30% stenosis with 60-70% apical stenosis.  There were  diagonals with 30-40% stenosis, the circumflex had 60% after the take-  off of the first marginal.  The right coronary artery was dominant.  There was diffuse 50% stenosis in the mid-segment, followed by an  occlusion.  There were excellent collaterals.  At that time, he also was  noted to have peripheral vascular disease with moderate diffuse right  iliac disease, followed by occlusion.  The left iliac had subtotal  occlusion.  The patient's coronary disease was managed medically.  He  had left iliac intervention with stenting at that time.   The patient has been followed with stress perfusion and was most  recently sent for one by Dr. Everardo All.  This demonstrated that his EF is  approximately 46%.  There was inferobasilar infarct and mild peri-  infarct ischemia, but no other areas of ischemia.   The patient says he never really had chest discomfort.  He did have some  dyspnea back in 2002, when all this was going on.  He currently has no  chest discomfort.  He rides an exercise bicycle at home.  He used to  have significant leg pain, which got better after his stenting.  However, he still feels like his left leg is  cold at night.  He does not  describe any leg pain.  He is limited in walking, more by back problems.  He denies any chest pressure, neck discomfort or arm discomfort.  He has  had no palpitations, presyncope or syncope.  He has had some right-arm  tingling and has had an MRI earlier this year for evaluation of that.   PAST MEDICAL HISTORY:  Coronary disease, as described.  Peripheral  vascular disease, as described.  Iron deficiency anemia, duodenal and  colonic AVMs, colonic adenoma, pancreatitis, post ERCP, irritable bowel  syndrome, gastroesophageal reflux disease, noninsulin-dependent diabetes  mellitus, hyperlipidemia, appendectomy, inguinal hernia repair, previous  alcohol abuse, ongoing tobacco abuse.   ALLERGIES:  PLAVIX causes a rash.   MEDICATIONS:  1. Multivitamin.  2. Aspirin 325 mg daily.  3. Atenolol 50 mg daily.  4. Vytorin 10/80 daily.  5. Insulin.  6. Iron.  7. Aggrenox.   REVIEW OF SYSTEMS:  As stated in the HPI, and otherwise negative for  other systems.  PHYSICAL EXAMINATION:  The patient is in no distress.  Blood pressure  121/63, heart rate 79 and regular.  Weight 157 pounds.  HEENT:  Eyelids unremarkable.  Pupils equal, round and reactive to  light.  Fundi not visualized.  Oral mucosa unremarkable.  NECK:  No jugular venous distention at 45 degrees.  Carotid upstroke  brisk and symmetric.  No bruits, no thyromegaly.  LYMPHATICS:  No cervical, axillary or inguinal adenopathy.  LUNGS:  Clear to auscultation bilaterally.  BACK:  No costovertebral angle tenderness.  CHEST:  Unremarkable.  HEART:  PMI not displaced or sustained.  S1 and S2 within normal limits.  No S3, no S4, no clicks, no rubs, no murmurs.  ABDOMEN:  Flat, positive bowel sounds, normal in frequency and pitch.  No bruits, no rebound, no guarding, no midline pulsatile mass, no  organomegaly.  SKIN:  No rashes, no nodules.  EXTREMITIES:  Two-plus upper pulses 2+ femorals with bilateral  bruits,  absent dorsalis pedis and posterior tibialis bilaterally, no edema, no  cyanosis, no clubbing.  NEUROLOGIC:  Grossly intact.   ASSESSMENT AND PLAN:  1. Coronary artery disease:  The patient had negative stress perfusion      study.  We reviewed his coronary anatomy in detail.  We reviewed      the need for secondary risk reduction.  2. Tobacco:  I counseled him for greater than three minutes.  I gave      him a prescription for Chantix.  We discussed the warnings,      including the black box warning of depression, suicidal ideation.      He will agree to try to take this.  3. Peripheral vascular disease:  I will get ABIs.  We will follow with      aggressive risk reduction.  I have suggested a walking regimen, if      he can tolerate it.  4. Dyslipidemia:  He understands the importance of tight lipid control      and he should have an LDL less than 70, an HDL greater than 40.  I      will defer to Dr. Everardo All.  I have suggested that he get it done      the next time he is there, which is in a few weeks.  5. Followup:  I would like to see the patient back in one year, or      sooner if needed.     Rollene Rotunda, MD, [facility]  Electronically Signed    JH/MedQ  DD: 01/19/2007  DT: 01/20/2007  Job #: [id number]   cc:   Gregary Signs A. Everardo All, MD

## 2010-09-08 ENCOUNTER — Encounter: Payer: Self-pay | Admitting: Cardiovascular Disease

## 2010-09-10 NOTE — H&P (Signed)
   NAME:  [name], [name]                          ACCOUNT NO.:  [id number]   MEDICAL RECORD NO.:  [id number]                   PATIENT TYPE:  INP   LOCATION:  6526                                 FACILITY:  [facility]   PHYSICIAN:  Sean A. Everardo All, M.D. [facility]           DATE OF BIRTH:  1931-01-26   DATE OF ADMISSION:  02/04/2002  DATE OF DISCHARGE:                                HISTORY & PHYSICAL   ADDENDUM:  The patient is seen in our office today for his anemia and, once  here, tells me about his chest pain. Upon hearing this, I advised to be  immediately transported to the hospital via ambulance with a telemetry  monitor and oxygen. He refuses this, stating he wants to go home and take  care of some things first. I told him very specifically that he risks  unnecessary death as a direct result of his refusal to be immediately  hospitalized. With full knowledge of this risk, the patient refuses  ambulance transportation and immediate hospitalization. He was given  paperwork including orders to bring to the hospital, and he states he will  go there in the next few hours.                                               Sean A. Everardo All, M.D. [facility]    SAE/MEDQ  D:  02/04/2002  T:  02/05/2002  Job:  [id number]

## 2010-09-10 NOTE — Discharge Summary (Signed)
NAME:  [name], [name] NO.:  [id number]   MEDICAL RECORD NO.:  [id number]                   PATIENT TYPE:  INP   LOCATION:  5731                                 FACILITY:  [facility]   PHYSICIAN:  Rene Paci, M.D. [facility]          DATE OF BIRTH:  1931-01-16   DATE OF ADMISSION:  09/17/2003  DATE OF DISCHARGE:  09/18/2003                                 DISCHARGE SUMMARY   DISCHARGE DIAGNOSES:  1. Iron-deficiency anemia .  2. Chronic arteriovenous malformation.  3. Hyperglycemia.  4. Hypertension.  5. Hyperlipidemia.   BRIEF ADMISSION HISTORY:  [name] is 77 year old male with a history of  AVMs.  The patient had a recurrent anemia.  On Sep 16, 2003, he had a  routine CBC that showed he had a hemoglobin of 7.  He was admitted for a  transfusion.   PAST MEDICAL HISTORY:  1. Adult-onset diabetes mellitus.  2. Tobacco use.  3. Dyslipidemia.  4. Gastroesophageal reflux disease.  5. Colonic polyps.  6. Hemorrhoids.  7. PAD of the lower extremities.  8. Erectile dysfunction.  9. Pancreatitis.  10.      History of AVMs of the colon followed by Dr. Kinnie Scales.  Last     colonoscopy was in October 2003.   HOSPITAL COURSE:  #1.  IRON-DEFICIENCY ANEMIA:  The patient was admitted with a hemoglobin of  7.  The patient received 2 units of packed red blood cells.  His hemoglobin  improved to 9.5.  The patient has a known history of chronic AVMs associated  with chronic anemia.  Therefore, there was no indication for further workup.   LABORATORY DATA AT DISCHARGE:  Hemoglobin 9.5.   DISCHARGE MEDICATIONS:  1. Amaryl 4 mg b.i.d.  2. Zocor 80 mg q.h.s.  3. Iron sulfate 325 mg b.i.d.  4. Tenormin 50 mg daily.  5. Aspirin 325 mg daily.  6. Glucophage XR 500 mg b.i.d.   FOLLOW UP:  Follow up with Dr. Everardo All in two to three weeks.      Cornell Barman, P.A. [facility]                  Rene Paci, M.D. [facility]    LC/MEDQ  D:  10/21/2003  T:  10/21/2003  Job:   [id number]   cc:   Gregary Signs A. Everardo All, M.D. [facility]

## 2010-09-10 NOTE — Cardiovascular Report (Signed)
NAME:  [name], [name] NO.:  [id number]   MEDICAL RECORD NO.:  [id number]                   PATIENT TYPE:  INP   LOCATION:  6526                                 FACILITY:  [facility]   PHYSICIAN:  Veneda Melter, M.D. [facility]               DATE OF BIRTH:  May 09, 1930   DATE OF PROCEDURE:  02/06/2002  DATE OF DISCHARGE:                              CARDIAC CATHETERIZATION   PROCEDURES PERFORMED:  1. Left heart catheterization.  2. Left ventriculogram.  3. Selective coronary angiography.   DIAGNOSES:  1. Severe single-vessel coronary artery disease.  2. Mild left ventricular systolic function.   HISTORY:  The patient is a 77 year old black gentleman with a history of  diabetes mellitus, tobacco abuse, dyslipidemia, obstructive lung disease,  coronary artery disease, and peripheral vascular disease, who presents with  substernal chest discomfort. The patient has known occlusion of the right  coronary artery by cardiac catheterization on October 12, 2000.  At that time,  he was found to have severe peripheral vascular disease as well and  subsequently underwent aortobifemoral bypass graft surgery.  He was recently  admitted to the hospital with substernal chest discomfort in the setting of  anemia.  He ruled out for acute myocardial infarction and is referred for  further angiographic assessment to rule out progression of coronary artery  disease.   TECHNIQUE:  Informed consent was obtained, patient brought to the cardiac  catheterization lab, and a 6 French sheath was placed in the right common  femoral artery extending into the bypass graft using the modified Seldinger  technique. Great care was taken to perform angiography of the right common  femoral artery and the anastomosis of the bypass graft to assure appropriate  positioning of the sheath and no damage to the native and grafted vessel. A  Wholey wire and short exchange technique were used during the case.   The 6  [place] and JR4 catheters were then used to engage the left and right  coronary arteries and selective angiography performed in various projections  using manual injections of contrast. A 6 French pigtail catheter was then  advanced to the left ventricle and left ventriculogram performed using power  injections of contrast.  At the termination of the case, the catheters and  sheath were removed and manual pressure applied until adequate hemostasis  was achieved. The patient tolerated the procedure well and was transferred  to the ward in stable condition.   FINDINGS:  1. Left main trunk:  Large caliber vessel with mild tapering of 10% in the     proximal segment in the area of heavy calcification.  2. LAD:  This is a large caliber vessel that provides a first diagonal     branch in the proximal segment and two trivial diagonal branches     distally.  The LAD has a tubular narrowing of 50% in the proximal  segment.  There is mild diffuse disease of 30% in the mid and distal     sections.  There is a high-grade tubular narrowing of 70-80% in the     apical LAD.  The first diagonal branch has an ostial narrowing of 50%.  3. Left circumflex artery:  This AV circumflex again is a large caliber     vessel and provides a major first marginal branch in the proximal segment     and two small marginal branches distally.  The AV circumflex has moderate     narrowing of 50% after the first marginal branch.  The first marginal     branch has a moderate narrowing of 50% in the proximal segment.  Two     distal marginal branches have mild disease of 30%.  4. Right coronary artery is dominant.  This is a small caliber vessel that     provides the posterior descending artery and three posterior ventricular     branches in its terminal segment. The right coronary artery is 100%     occluded proximally. It fills via right to right collaterals in the mid     and distal sections and there is a  long tubular narrowing of 60% in the     distal RCA.  The posterior descending artery is patent, although it     appears to have proximal disease of 70%.  There are three posterior     ventricular branches that have mild disease.   LEFT VENTRICULOGRAM:  Mildly dilated end-systolic dimension. Overall, left  ventricular function is mildly impaired, ejection fraction approximately  45%. There is hypokinesis of the basal inferior wall.  No mitral  regurgitation is appreciated. LV pressure is 110/10, aortic is 110/60, LVEDP  equals 16.   ASSESSMENT AND PLAN:  The patient is a 77 year old gentleman with severe  single-vessel coronary artery disease and mild left ventricular systolic  dysfunction.  This appears stable from his prior catheterization and  continued medical treatment will be recommended.  Should the patient have  recurrence of symptoms, consideration may be given towards EECP.  However,  surgical revascularization will be reserved for advancement of disease in  the left coronary system.                                                   Veneda Melter, M.D. [facility]    NG/MEDQ  D:  02/06/2002  T:  02/06/2002  Job:  [id number]   cc:   Gregary Signs A. Everardo All, M.D. [facility]   Griffith Citron, M.D.

## 2010-09-10 NOTE — Cardiovascular Report (Signed)
[place]. [facility]  Patient:    [name], [name]                       MRN: [id number] Proc. Date: 10/12/00 Adm. Date:  [id number] Attending:  Judie Petit CC:         Justine Null, M.D. [facility]                        Cardiac Catheterization  PROCEDURES PERFORMED: 1. Left heart catheterization. 2. Left ventriculogram. 3. Selective coronary angiography. 4. Abdominal aortogram. 5. Percutaneous transluminal angioplasty and stenting of the left    iliac artery.  DIAGNOSES: 1. Single-vessel coronary artery disease. 2. Normal left ventricular systolic function. 3. Peripheral vascular disease. 4. Claudication with rest pain.  INDICATIONS:  The patient is a 77 year old, black gentleman with diabetes mellitus and multiple cardiac risk factors, who presents with progressive left lower extremity claudication.  The patient has had pain to the point that it has been occurring at rest.  He has also had mild dyspnea on exertion and fleeting episodes of chest discomfort.  A stress Cardiolite study showed ischemia in the inferoapical walls.  He presents for further assessment.  TECHNIQUE:  After informed consent was obtained, the patient was brought to the cardiac catheterization lab.  A 6 French sheath was placed in the right femoral artery.  However, significant difficulty was encountered in advancing the wire to the external iliac.  Manual injections of contrast showed occlusion of the external iliac artery.  We then cannulated the left common femoral artery and again significant difficulty was encountered advancing the wire through the iliac artery.  Manual injections of contrast showed subtotal occlusion of the common iliac artery in its mid section.  Using a diagnostic JR4 catheter as well as the [facility] wire, we probed the subtotal occlusion and were finally able to cross this lesion. However, the catheter was occlusive in the iliac artery.  Using  exchange technique, left heart catheterization and selective angiography were then performed in the usual fashion.  A No Torque catheter was used to engage the right coronary artery due to damping with a JR4.  An abdominal aortogram was also performed using power injections of contrast through the pigtail catheter.  Left iliac angiography was performed using manual injections of contrast.  FINDINGS:  Initial findings are as follows: 1. Left main trunk:  The left main trunk is large caliber vessel with mild    irregularities not greater than 20%. 2. LAD:  This is a large caliber vessel that provides two small diagonal    branches.  The LAD has a proximal narrowing of 40%.  There is then diffuse    disease of 30% in the mid and distal section.  There is a discrete area    of 60-70% narrowing in the apical LAD.  The two diagonal branches are small    caliber vessels with moderate disease of 30-40%. 3. Left circumflex artery:  This is a large caliber vessel and provides a    large bifurcating first marginal branch in the proximal segment with the    AV circumflex continuing on as a diminutive vessel that provides two small    marginal branches distally.  The proximal AV circumflex and marginal branch    have moderate disease of 30%.  The AV circumflex has a narrowing of 60%    after the takeoff of the first marginal branch.  The distal marginal    branches have mild diffuse disease. 4. Right coronary artery:  Dominant.  This is a small caliber vessel that    provides the posterior descending artery and two posterior ventricular    branches.  The right coronary artery has diffuse disease of 50% in the    proximal mid section.  There is then a short segment of occlusion of 100%    in the mid section of the RCA.  The distal branch vessel are seen to fill    via delayed right to right collaterals and competitive flow is noted from    left to right collaterals as well.  Advance collateralization  is noted    around the site of occlusion.  LEFT VENTRICULOGRAM:  Normal end-systolic and end-diastolic dimensions. Overall left ventricular function is well preserved, ejection fraction of approximately 55%.  There is very mild inferior wall hypokinesis.  No  mitral regurgitation is noted.  LV pressure is 157/10, aortic is 157/70, LVEDP equals 15.  ABDOMINAL AORTOGRAM:  Abdominal aorta is of normal caliber.  There is moderate atheromatous disease with calcification with no critical stenosis or dilatation.  The renal arteries are single and patent bilaterally.  The right iliac artery is heavily calcified with moderate diffuse disease.  It is then occluded in its external segment.  The left iliac artery also has severe calcification and atheroma in the mid section with a subtotal occlusion.  These findings were reviewed with the patient and we elected to proceed with medical therapy for the patients coronary disease.  The patient did have significant left lower extremity discomfort prior to admission and with the sheath in situ and it was felt that this was an unstable situation.  We thus elected to proceed with left iliac intervention.  The patient was given heparin as well as Plavix 375 mg orally.  He was also started on Integrilin drip.  A long 8 French sheath was placed in the iliac artery using the exchange technique and this was carefully used to cross the stenosis.  A Wholey wire was then placed within this and a 8 x 4 PowerFlex balloon introduced.  This was used to predilate the lesion at 10 atmospheres for 60 seconds.  Repeat angiography showed significant residual atheroma and stenosis of greater than 60% and this was felt to be a unstable situation.  A P-[id number] stent was then hand crimped on 8 x 4 PowerFlex balloon and carefully positioned in the mid section of the left iliac artery.  It was deployed at 10 atmospheres for 60 seconds.  Angiography now showed stabilization of  the lesion, although there was a mild aneurysm in the mid section prior to the  stenosis and the stent did not appear to be fully opposed to this vessel.  A 12 x 2 PowerFlex balloon was then introduced and used to post-dilate the proximal segment of the stent at 10 atmospheres for 60 seconds.  Repeat angiography now showed an excellent result with full apposition of the stent. There was mild residual narrowing of less than 10% due to intrinsic atheroma and calcification within the vessel wall.  However, no significant ______ flow was noted within the area of stenting.  The patient had no further leg discomfort.  The long 8 sheath was exchanged over the wire for a short 8 sheath which was secured in position.  The patient was then transferred to the holding area where the sheath will be removed when the ACT returns to  normal. He tolerated the procedure well.  FINAL RESULTS:  Successful percutaneous transluminal coronary angioplasty and stenting of the left iliac artery with reduction of subtotal occlusion to 10% with placement of a P-294 stent dilated to 12 mm in the proximal segment and 8 mm distally.  ASSESSMENT AND PLAN:  The patient is a 77 year old gentleman with multiple cardiac risk factors including tobacco use, hypertension, diabetes mellitus and dyslipidemia, who presents with severe single-vessel coronary artery disease, as well as peripheral vascular disease.  Continued medical therapy will be pursued for his right coronary lesion.  Should he have further discomfort or dyspnea an attempt may be considered toward recanalization, although the chronic nature of this occlusion as well as a small size of the vessel would result in a low chance for success and a high re-stenosis rate. The patient has also had significant left lower extremity claudication with pain at rest.  Should he have significant improvement in symptoms with stabilization of the left iliac lesion, further  angiography may not be necessary and we may be able to continue medical therapy and a regular walking program. DD:  10/12/00 TD:  10/13/00 Job: [id number]

## 2010-09-10 NOTE — Consult Note (Signed)
[place]. [facility]  Patient:    [name], [name]                       MRN: [id number] Proc. Date: 11/10/00 Adm. Date:  [id number] Attending:  Veneda Melter CC:         Veneda Melter, M.D.   Consultation Report  CHIEF COMPLAINT:  Lower extremity claudication bilateral, left worse than right.  HISTORY OF PRESENT ILLNESS:  I appreciate seeing this 77 year old patient in consultation regarding his severe aortoiliac occlusive disease.  This patient recently underwent coronary angiography by Dr. Veneda Melter and was found to have a total occlusion of his right coronary artery disease with good collateralization and excellent ventricular function (ejection fraction 55%). During the angiogram, which was through the left common femoral artery, he was noted to have some ischemia of the left leg, which required stenting of the left common iliac artery at the time of the coronary angiogram.  He now returns with progressive claudication and rest pain in the left leg.  He states that he has been having claudication symptoms initially in the left leg followed by the right leg for the last eight or nine months and in the hip and thigh region, which has become more severe.  He denies any nonhealing ulcerations or previous vascular problems.  PAST SURGICAL HISTORY:  Appendectomy and cholecystectomy.  PAST MEDICAL HISTORY:  Coronary artery disease, as per present illness. Negative for CVA, TIA, amaurosis fugax, hypertension, chronic obstructive pulmonary disease, deep venous thrombosis, or pulmonary emboli.  The patient does have type 2 diabetes mellitus and takes Amaryl 1 mg b.i.d.  MEDICATIONS: 1. Amaryl. 2. Zocor 10 mg daily. 3. Aspirin 325 mg daily. 4. Pletal 100 mg b.i.d. 5. Iron sulfate 325 mg b.i.d. 6. Protonix 40 mg daily.  ALLERGIES:  None.  REVIEW OF SYSTEMS:  Unremarkable.  PHYSICAL EXAMINATION:  GENERAL:  The patient is a well-developed, well-nourished  African-American male.  He is in no apparent distress.  He is alert and oriented x 3.  VITAL SIGNS:  Blood pressure 117/58, heart rate 80, temperature 97.6, respirations 12.  NECK:  Supple.  His carotid pulses are 3+.  No palpable adenopathy in the neck.  NEUROLOGIC:  Normal.  EXTREMITIES:  Upper extremity pulses are 2-3+ bilaterally.  CHEST:  Clear.  ABDOMEN:  Soft and nontender with no palpable masses.  He has absent femoral pulse on the right and 2+ high femoral pulse on the left.  There are no popliteal or distal pulses in either lower extremities.  There is no evidence of ischemia or ulceration.  LABORATORY DATA:  Lower extremity angiography was reviewed with Dr. Chales Abrahams on today, July 18.  He has diffuse aortoiliac occlusive disease with total occlusion of the right external iliac artery.  There is diffuse left common iliac disease with calcification and total occlusion of the left common femoral artery and a moderate stenosis of the left external iliac artery. Both superficial femoral and popliteal arteries and distal vessels in both legs are patent.  IMPRESSION:  Severe aortoiliac occlusive disease with severe claudication, left worse than right, and some rest ischemia.  RECOMMENDATIONS:  The patient needs aortobifemoral bypass grafting and left common femoral endarterectomy.  Discussed this with Dr. Chales Abrahams and we will admit him on Friday the 26th for this surgery. DD:  11/10/00 TD:  11/10/00 Job: [id number]

## 2010-09-10 NOTE — Consult Note (Signed)
NAME:  [name], LOWDEN NO.:  [id number]   MEDICAL RECORD NO.:  [id number]                   PATIENT TYPE:  INP   LOCATION:  1827                                 FACILITY:  [facility]   PHYSICIAN:  Jesse Sans. Wall, M.D. [facility]            DATE OF BIRTH:  May 07, 1930   DATE OF CONSULTATION:  02/04/2002  DATE OF DISCHARGE:                                   CONSULTATION   We were asked by Dr. Romero Belling to evaluate the patient, a 77 year old  African-American married male with known peripheral vascular disease, now  with chest tightness consistent with angina.  He had about a 3-hour episode  today after getting up and eating breakfast.   He was found to have a hemoglobin of 8.0 several days ago.  He is admitted  now for transfusion and rule-out MI.   PAST MEDICAL HISTORY:  Significant for peripheral vascular disease with  bilateral femoral-popliteal grafts, treated with percutaneous intervention  by Dr. Veneda Melter.  He has also had aorta-bifemoral grafts placed in the  past by Dr. Hart Rochester.   He has a history of AVMs of the cecum and a history of polyps.  He has a  history of chronic GI bleeding with hemoglobins as low as this in the past.   Prior to these episodes of the last couple of days, he really had no  symptoms of exertional angina.   CARDIOVASCULAR RISK FACTORS:  Age, sex, tobacco use, hyperlipidemia being  treated on Zocor.  His father died of an MI at age 54.  He has no coronary  disease in his siblings.   CURRENT MEDICATIONS:  Not listed in the chart, but he is written for  Tenormin 50 mg a day, Zocor 10 mg a day, aspirin 81 mg a day.   FAMILY HISTORY:  Significant for his father dying of an MI at 55, as  mentioned above.   SOCIAL HISTORY:  He is retired from [employer]   ALLERGIES:  He has no known drug allergies.   REVIEW OF SYSTEMS:  He has noted no gross melena or hematochezia.  He has a  lot of indigestion and uses Mylanta on occasion.   His appetite has been  good.  His weight has been stable.  Other than HPI, there is no other review  of systems that is pertinent.   EXAMINATION:  His exam tonight shows him to be pain-free.  He is no acute  distress.  His skin is warm and dry.  Neurologic exam is grossly intact.  VITAL SIGNS:  Blood pressure 140/80.  Pulse:  74 and regular.  His EKG is  without acute changes.  Respiratory rate:  20 and unlabored.  O2 sats:  Adequate on 2L.  HEENT:  Exam reveals pupils are equally responsive to light and  accommodation.  Extraocular movements are intact.  Sclerae clear.  There is  no facial  asymmetry.  NECK:  Neck shows good carotid upstrokes with a right carotid bruit.  There  is no JVD.  Thyroid is not enlarged.  Trachea is midline.  LUNGS:  Clear to auscultation and percussion except for a few crackles in  the left base.  HEART:  Heart reveals an S4 and a soft systolic murmur at the left sternal  border.  S2 splits physiologically.  ABDOMINAL EXAM:  He is soft and nontender.  He has an aorta-bifemoral graft  scar present.  No hepatomegaly.  EXTREMITIES:  Extremities show good femoral pulses bilaterally.  He has  pedal pulses present bilaterally.   LABORATORY DATA:  His other laboratory data shows a normal creatinine.  Chest x-ray shows no acute cardiopulmonary disease.   IMPRESSION:  1. Coronary artery disease, currently unmasked by his severe anemia.  2. Peripheral vascular disease.  3. Hyperlipidemia, on treatment.  4. Tobacco use.  5. Controlled hypertension.  6. History of colonic polyps and cecal arteriovenous malformations with     chronic GI bleeding.   RECOMMENDATIONS:  1. Check cardiac enzymes.  2. Transfuse to hemoglobin above 10.  3. If rules out and is stable, adenosine Cardiolite to rule-out high-risk     anatomy.  If he has significant degree of ischemia, cardiac     catheterization would be recommended.  I discussed this with the patient     and his wife,  and they understand.  4. Carotid ultrasound, check right carotid bruit.  5. Continue current cardiac medications and hyperlipidemic medications.  6. Low-dose aspirin if okay with GI.  7. Decrease or stop smoking.   The above has been discussed with the patient and his very nice wife.  They  agree with our plan.                                                Thomas C. Daleen Squibb, M.D. [facility]    TCW/MEDQ  D:  02/04/2002  T:  02/06/2002  Job:  [id number]   cc:   Gregary Signs A. Everardo All, M.D. [facility]   Veneda Melter, M.D. [facility]   Griffith Citron, M.D.   Quita Skye Hart Rochester, M.D.  [address]  [place]  [place] [address]  Fax: [phone]

## 2010-09-10 NOTE — Discharge Summary (Signed)
[place]. [facility]  Patient:    Steve, Conrad                       MRN: [id number] Adm. Date:  [id number] Disc. Date: [id number] Attending:  Judie Petit Dictator:   Cornell Barman, P.A. CC:         Justine Null, M.D. [facility]  Griffith Citron, M.D.  Dr. Chales Abrahams   Discharge Summary  DISCHARGE DIAGNOSES: 1. Chronic single vessel coronary artery disease. 2. Left iliac occlusion. 3. Microcytic iron deficiency anemia. 4. Hyperkalemia.  BRIEF ADMISSION HISTORY:  The patient is a 77 year old African-American male who presented to short-stay for a scheduled cath.  He had a Persantine cardiolite Sep 12, 2000 that suggested mild ischemia with distal inferior apical region and an EF of 42%.  He was scheduled for cath on October 11, 2000. Precath blood work on October 04, 2000 revealed a hemoglobin of 8.2.  Efforts to reach the patient prior to his scheduled cath were unsuccessful.  The patients hemoglobin in October 2000 was 12.9.  The patient does have a history of iron deficiency anemia and does have a history of duodenal colonic AVMs.  The patient also had a history of colonic adenoma and his last colonoscopy was in 2000.  The patient does have chronically black stools because he is on iron therapy.  He denies any bright red blood or maroon stools.  He denied any coffee-ground emesis, hematemesis.  He says he has had a remote endoscopy but this was about five years ago.  PAST MEDICAL HISTORY:  1. Duodenal and colonic AVMs.  2. Colonic adenoma.  3. Pancreatitis - post ERCP.  4. Irritable bowel syndrome.  5. Gastroesophageal reflux disease.  6. Non-insulin-dependent diabetes mellitus.  7. Hyperlipidemia.  8. Status post appendectomy.  9. Status post right inguinal hernia repair. 10. History of alcohol abuse.  HOSPITAL COURSE: #1 - MICROCYTIC ANEMIA:  We did obtain iron studies, patients total iron was less than 10, stool for occult blood was  negative.  We did transfuse the patient 2 units packed red blood cells.  His hemoglobin went from 7.9 to 9.8. We did increase the patients iron therapy during his hospitalization.  The patient was not felt to require an inpatient GI evaluation.  The patient has been advised to follow up with Dr. Kinnie Scales regarding the need for a possible endoscopy.  The patient was also started on a protime pump inhibitor.  The patient did have a slight drop in his hemoglobin following his catheterization, however, his cath was complicated by bilateral groin hematoma and bruising and this may be the etiology of his drop in hemoglobin.  The patient was also advised that if he does not have response to iron therapy, he may also need an outpatient hematology evaluation.  #2 - CARDIOVASCULAR:  The patient did have an abnormal cardiolite with diminished LV function.  His cath on October 11, 2000 was cancelled but was performed on October 12, 2000.  The patient did have single vessel coronary artery disease with a chronic occlusion.  This was in the RCA, which was dominant with 100% mid occlusion.  There was a PDA plus two PV branches via the left coronary artery.  The patients EF was 55%.  The patient also was found to have an acute ischemic left leg with subtotal left iliac occlusion. Occlusion was 99% following PTCA and stent to the left iliac artery it was 10%.  Dr. Chales Abrahams recommended medical management for his coronary disease and Plavix for one month.  #3 - HYPOKALEMIA:  The patients potassium dropped to 3.4 and will be corrected prior to discharge.  LABORATORY DATA UPON DISCHARGE:  Potassium 3.4, BUN 8, creatinine 0.9, hemoglobin 9.1, hematocrit 28, white count was 11,000, platelet count was 326, reticulocyteulocyte count was 2% with an absolute retic count of 70.8000, stool for blood was negative.  Total iron was less than 10.  Chest x-ray on October 11, 2000 showed borderline cardiomegaly, no active  disease.  DISCHARGE MEDICATIONS: 1. Amaryl 1 mg b.i.d. 2. Zocor 10 mg q.d. 3. Aspirin 325 mg q.d. 4. Pletal 100 mg b.i.d. 5. Iron sulfate 325 mg b.i.d. 6. Protonix 40 mg q.d. 7. Plavix 75 mg q.d. for one month.  FOLLOWUP:  The patient is to follow up with Dr. Everardo All in a month for a CBC and iron studies.  We will also have a followup next week at the lab for CBC. The patient should also follow up with Dr. Kinnie Scales regarding the need for endoscopy.  The patient will follow up with Dr. Chales Abrahams as instructed. DD:  10/13/00 TD:  10/15/00 Job: [id number]

## 2010-09-10 NOTE — Procedures (Signed)
[place]. [facility]  Patient:    [name], [name]                       MRN: [id number] Proc. Date: 11/10/00 Adm. Date:  [id number] Disc. Date: [id number] Attending:  Veneda Melter                           Procedure Report  PROCEDURES: 1. Abdominal aortogram. 2. Bilateral lower extremity angiogram.  DIAGNOSIS:  Severe peripheral vascular disease with rest pain.  HISTORY:  [name] is a 77 year old black gentleman with a known history of coronary disease and peripheral vascular disease, who had recently presented for cardiac evaluation.  The patient underwent cardiac catheterization showing occlusion of the right coronary artery with well-preserved LV function.  This was to be treated medically.  During catheterization he was found to have an occluded right iliac artery, prompting left groin access and findings of subtotal left iliac artery.  He had rest ischemia on the table requiring urgent percutaneous intervention to the left iliac artery for stabilization. Subsequently he did well until the past 1-1/2 weeks, when he noticed progression of left lower extremity discomfort to the point that this had been occurring at rest.  He presents now for further assessment.  Ankle brachial indices performed on November 01, 2000, were 0.89 on the right and 0.28 on the left.  DESCRIPTION OF PROCEDURE:  Informed consent was obtained.  The patient was brought to the peripheral vascular lab, a 5 French sheath placed in the right groin, and a right lower extremity angiogram performed using digital subtraction angiography.  A 5 French sheath was then placed in the left common femoral artery and a pigtail catheter advanced into the abdominal aorta. Abdominal aortogram as well as iliac angiography were performed using power injection of contrast.  A left lower extremity angiogram was then performed using digital subtraction angiography.  At the termination of the case,  both sheaths were removed and manual pressure applied until adequate hemostasis was achieved.  The patient tolerated the procedure well and was transferred to the floor in stable condition.  FINDINGS: 1. Abdominal aorta is of normal caliber.  There is severe atheromatous buildup    without critical stenosis or dilatation.  The right renal artery is single    and patent.  The left renal artery has a mild narrowing of 30% in the    proximal segment and is also single and widely patent. 2. Right lower extremity:  The right iliac artery has moderate diffuse    atheroma in the common segment.  The bifurcation also has mild disease.    The iliac artery is heavily calcified.  It is then 100% occluded in its    external segment.  The common femoral artery is reconstituted via    collaterals and has severe diffuse disease of 70-80% in its midsection.    The bifurcation with the profunda and superficial femoral artery is patent.    There is mild narrowing of 50-70% in the proximal segment of the profunda    branch.  The superficial femoral artery is patent and has mild diffuse    disease of 30% along its course. The popliteal artery also has mild    disease.  Three-vessel runoff is noted with mild diffuse disease of 30%. 3. Left lower extremity:  The left iliac artery has an ostial narrowing of    50%.  The  stent in the common iliac artery is patent.  The bifurcation is    patent as well, although the iliac artery shows heavy calcification along    the entire course.  There is then a focal narrowing of 70% in the left    external iliac artery.  The common femoral artery is then occluded in its    midsection.  The bifurcation of the profunda and SFA is then seen to fill    faintly via collaterals.  There is the suggestion of stenosis in the    proximal segment of the left superficial femoral artery, although its    significance cannot be determined due to underfilling of the vessel.  The     superficial femoral artery is seen to fill via delayed filling from    collaterals and appears to have mild diffuse disease of 30%.  The anterior    tibial and peroneal arteries have mild diffuse disease but extend to the    ankle.  The posterior tibial artery is seen to taper in the midsection and    is a small-caliber vessel.  ASSESSMENT AND PLAN:  [name] is a 76 year old gentleman with severe peripheral vascular disease.  He has a large atheromatous burden with heavy calcification in both the iliac arteries as well as occlusion of the left common femoral artery.  Due to the extent of disease and the progression of his pain to occurring at rest, consideration will be given toward surgical revascularization. DD:  11/10/00 TD:  11/11/00 Job: [id number]

## 2010-09-10 NOTE — H&P (Signed)
NAME:  [name], [name]                          ACCOUNT NO.:  [id number]   MEDICAL RECORD NO.:  [id number]                   PATIENT TYPE:  INP   LOCATION:  5731                                 FACILITY:  [facility]   PHYSICIAN:  Sean A. Everardo All, M.D. [facility]           DATE OF BIRTH:  [birth year]   DATE OF ADMISSION:  09/17/2003  DATE OF DISCHARGE:                                HISTORY & PHYSICAL   REASON FOR ADMISSION:  Severe anemia.   HISTORY OF THE PRESENT ILLNESS:  The patient is a 77 year old man who has a  long history of arteriovenous malformations of the colon for which he has  seen Dr. Asencion Partridge. Medoff.  He has a history of recurrent anemia due to the  AVMs.  On Sep 16, 2003, he had a routine CBC to follow up his chronic  intermittent anemia.  His hemoglobin was only 7.0 and he states in  retrospect, he has 3 months of slight shortness of breath with some  associated left hip pain, but otherwise feels fine.  The patient was also  seen on Sep 16, 2003 primarily for following up his hyperglycemia.  An  increase in his Amaryl up to 4 mg twice daily brought his glucoses from the  300s to the 200s, so Glucophage XR is added on Sep 16, 2002.   PAST MEDICAL HISTORY:  1. Type 2 diabetes.  2. Cigarette smoker.  3. Dyslipidemia.  4. GERD.  5. Colonic polyps.  6. Hemorrhoids.  7. PAD of the lower extremities.  8. Erectile dysfunction.  9. Pancreatitis.  10.      His last colonoscopy was by Dr. Griffith Citron in October of     2003.   MEDICATIONS:  1. Amaryl 4 mg twice daily.  2. Zocor 80 mg nightly.  3. Atenolol 40 mg daily.  4. Aspirin 325 mg daily.  5. A multivitamin.  6. Ferrous sulfate 325 mg daily.   SOCIAL HISTORY:  The patient is married.  He is retired.   FAMILY HISTORY:  Family history negative for anemia.   REVIEW OF SYSTEMS:  He has a chronic dry cough but he denies the following:  Easy bruising, hematuria, abdominal pain, rectal bleeding, melena, nausea,  vomiting, syncope, chest pain, blurry vision, hypoglycemia and numbness.   PHYSICAL EXAMINATION:  VITAL SIGNS:  Blood pressure is 120/60, heart rate is  74, temperature is 99 degrees.  The weight is 156.  GENERAL:  In no distress.  He appears healthy, although elderly.  SKIN:  Not diaphoretic.  I do not appreciate any ecchymoses.  HEENT:  Head is atraumatic.  Sclerae nonicteric.  There is no periorbital  swelling.  Pharynx:  No erythema.  NECK:  No goiter.  Neck is supple.  CHEST:  Chest clear to auscultation.  CARDIOVASCULAR:  No JVD.  Trace bilateral pretibial edema.  Regular rate and  rhythm.  No murmur.  Pedal  pulses are intact and the carotid arteries show  no bruit.  ABDOMEN:  Abdomen soft, nontender.  No hepatosplenomegaly, no mass, not  distended.  RECTAL:  Rectal is Hemoccult-negative.  EXTREMITIES:  No deformity is seen.  NEUROLOGIC:  Alert, well-oriented, does not appear anxious nor depressed and  he moves all fours and gait is observed in the office to be normal.  Cranial  nerves appear to be intact.   LABORATORY STUDIES:  On Sep 16, 2003, hemoglobin 7.0, MCV 65.5; BMET normal  except for glucose 288; LFT normal except for GPT of 57; iron saturation  2.4%, vitamin B12 589; hemoglobin A1c 7.6.   IMPRESSION:  1. History of colonic arteriovenous malformations.  2. Exacerbation of chronic anemia due to #1, now severe.  3. Type 2 diabetes with inadequate control.  4. Mildly elevated hepatic transaminase.  5. Left hip pain which is probably due to osteoarthritis.  6. Other chronic medical problems as noted in the history of present     illness.   PLAN:  1. Admission is advised on Sep 16, 2003 but he refused this, so he is     admitted on Sep 17, 2003, when he gives his consent.  2. Transfuse 2 units of red cells.  I have advised him of the risks versus     benefits of transfusion.  I have told him that there is a small but real     risk of acquiring a serious infectious  disease from a transfusion and he     gives his consent.  3. Continue the Amaryl and Glucophage XR.  4. Check glucoses 4 times a day.  5. Increase the ferrous sulfate to 325 mg twice daily.  6. Outpatient workup of his hip pain.  7. Outpatient workup of his elevated ALT.  8. Continue the Zocor.                                                Sean A. Everardo All, M.D. [facility]    SAE/MEDQ  D:  09/17/2003  T:  09/18/2003  Job:  [id number]

## 2010-09-10 NOTE — Op Note (Signed)
[place]. [facility]  Patient:    [name], [name]                         MRN: [id number] Proc. Date: 11/17/00 Attending:  Quita Skye. Hart Rochester, M.D.                           Operative Report  PREOPERATIVE DIAGNOSIS: Severe aortoiliac occlusive disease with rest ischemia, left leg.  POSTOPERATIVE DIAGNOSIS: Severe aortoiliac occlusive disease with rest ischemia, left leg.  OPERATION/PROCEDURE:  1. Aortobifemoral bypass grafting using 14 x 8 mm Hemashield Dacron graft.  2. Proximal aortic endarterectomy.  3. Extensive right common femoral and distal external iliac endarterectomy.  4. Re-implantation of inferior mesenteric artery.  SURGEON: Quita Skye. Hart Rochester, M.D.  ASSISTANT: Sherrie Aviyon, P.A.  ANESTHESIA: General endotracheal.  DESCRIPTION OF PROCEDURE: The patient was taken to the operating room and placed in the supine position, at which time satisfactory general endotracheal anesthesia was administered.  The abdomen and groins were prepped with Betadine scrubbing solution and draped in routine sterile manner. Longitudinal incisions were made in both groins and the common superficial and profunda femoris arteries dissected free.  On the right side there was total occlusion of the common femoral artery with plaque palpable circumferentially up to the inguinal ligament and the external iliac artery was exposed proximally.  The superficial femoral artery was exposed for about 3-4 cm.  On the left side there was diffuse severe plaque of the proximal common femoral artery with total occlusion of the external iliac proximally, and diffuse disease extending on down into the common femoral, profunda femoris, and superficial femoral artery.  A midline incision was made in the abdomen and carried down through subcutaneous tissue and the linea alba using the Bovie. The peritoneal cavity was entered and thoroughly explored.  The stomach, duodenum, small bowel,  and colon were unremarkable.  The liver was smooth with no palpable masses.  The gallbladder appeared normal.  The transverse colon was elevated and the intestines reflected to the right side.  Retroperitoneum was incised, exposing the aorta from the renal arteries to the bifurcation. There was severe diffuse calcific atherosclerotic involving the entire infrarenal aorta.  The inferior mesenteric artery was chronically occluded and was dissected free for reimplantation.  The patient was given 25 g of Mannitol and heparinized.  The aorta was occluded distal to the renal arteries and distal to the inferior mesenteric artery and transected.  It was so calcified that endarterectomy was required both proximally and distally in order to oversew the stump, which was done with two layers of 3-0 Prolene. The proximal aorta was endarterectomized up to the clamp with severe calcific plaque.  A 14 x 8 mm Hemashield Dacron graft was anastomosed end-to-end to the aortic stump with continuous 3-0 Prolene, buttressing this with a strip of felt.  This was checked for leaks and none were present.  The limbs were delivered through the tunnels posterior to the ureters on the right side and extensive endarterectomy was done from the distal external iliac artery throughout the entire common femoral artery for a totally occlusive lesion. The distal end of it was tacked down with some 6-0 Prolene sutures and the superficial femoral artery appeared patent proximally.  End-to-side anastomosis was done with 5-0 Prolene, the right leg opened with no hypotension.  On the left leg there was total occlusion of the distal external iliac artery  and severe calcific plaque.  Arteriotomy was made in the distal common femoral artery and extended down onto the origin to a diseased superficial femoral artery and end-to-side anastomosis was done with 5-0 Prolene.  Clamps were then released and there was excellent pulse in  both limbs.  Protamine was given to reverse the heparin; however, prior to this the inferior mesenteric artery - which had been transected, was examined and it was a good vessel distal to its proximal obstruction.  It was reimplanted into the aortic graft with 6-0 Prolene after opening the graft with a 4 mm punch using a partial occlusion clamp during this anastomosis.  When this was completed the clamp was released and there was an excellent pulse and Doppler flow in all vessels.  Following adequate hemostasis with protamine the wounds were closed in layers in the groin with Vicryl and clips.  The retroperitoneum was closed with 3-0 chromic, the linea alba with #1 Prolene, and the skin with clips.  A sterile dressing was applied and the patient taken to the recovery room in satisfactory condition.  The patient did receive four units of packed red blood cells, one unit of blood from the Cell Saver, had an excellent urinary output, and was stable hemodynamically. DD:  11/17/00 TD:  11/17/00 Job: [id number]

## 2010-09-10 NOTE — H&P (Signed)
[place]. [facility]  Patient:    [name], [name]                         MRN: [id number] Adm. Date:  11/17/00 Attending:  Quita Skye. Hart Rochester, M.D. Dictator:   Durenda Age, P.A.-C. CC:         Dr. Chales Abrahams   History and Physical  DATE OF BIRTH: [birth year]  CHIEF COMPLAINT: Aortoiliac occlusive disease.  HISTORY OF PRESENT ILLNESS: This 77 year old black male was seen in consultation on November 09, 2000 at [place]. [facility] regarding his severe aortoiliac occlusive disease.  He recently underwent cardiac catheterization by Dr. Chales Abrahams and during angiogram he was noted to have some ischemia in the left leg, which required stenting of the left common iliac artery.  He then was referred to Dr. Hart Rochester.  He reviewed the angiogram.  This showed diffuse AIOD with total right external iliac artery, diffuse left common iliac artery disease with calcification, and total left common femoral artery and moderate stenosis in the left external iliac artery.  Also seen was bilateral superficial femoral and popliteal arteries and distal vessels in both legs are patent.  Dr. Hart Rochester recommended aortobifemoral bypass graft and left common femoral artery as the best treatment to relieve these claudication symptoms, scheduled for November 17, 2000.  No buttock pain.  The pain is localized in both hips, more pronounced on the left than on the right, bilateral thigh pain, as well as left calf pain.  The pain is intense, present at rest, but most of the time is worse with ambulation.  No foot pain.  No night pain.  No visible ulcers.  No gangrenous changes.  No ischemic changes. No decrease in temperature.  No shortness of breath or dyspnea on exertion. No chest pain or palpitations.  PAST MEDICAL HISTORY:  1. AIOD, claudication.  2. Diabetes mellitus, non-insulin dependent.  3. History of ongoing tobacco abuse.  4. GERD symptoms.  5. Hypercholesterolemia.  PAST SURGICAL  HISTORY:  1. Status post PTCA and stenting on November 19, 2000, Dr. Chales Abrahams.  2. Status post appendectomy and cholecystectomy, remote.  MEDICATIONS:  1. Plavix, stopped on November 13, 2000.  2. Amaryl 1 mg b.i.d.  3. Zocor 10 mg q.d.  4. Aspirin 325 mg q.d.  5. Ferrous sulfate 325 mg b.i.d.  ALLERGIES: NKDA.  REVIEW OF SYSTEMS: See HPI and Past Medical History for significant positives. No kidney disease or asthma.  FAMILY HISTORY: Mother alive and well at 87.  Father died at a young age of an MI.  One sister died of kidney disease at age 4.  SOCIAL HISTORY: Married.  Two children.  He is retired.  He has smoked one packs of cigarettes a day for the last 50 years.  He denies any alcohol intake.  PHYSICAL EXAMINATION:  GENERAL: Well-developed, well-nourished 77 year old black male, in no acute distress.  Alert and oriented x 3.  VITAL SIGNS: Blood pressure 120/60, pulse 74, respirations 18.  HEENT: Normocephalic, atraumatic.  PERRLA.  EOMI.  Funduscopic examination within normal limits.  NECK: Supple.  No JVD, bruits, or lymphadenopathy.  CHEST: Symmetrical inspirations.  Lungs other than a trace of rhonchi show no wheezes or rales.  CARDIOVASCULAR: Regular rate and rhythm without murmurs, rubs, S4 gallop present.  ABDOMEN: Soft, nontender.  Bowel sounds x 4 normal.  GU/RECTAL: Deferred.  EXTREMITIES: No clubbing, cyanosis, or edema.  No ulcerations.  Both feet have  decreased temperature.  Peripheral pulses - carotids 2+ bilaterally, femoral 2+ on the left and absent on the right; popliteal, dorsalis pedis and posterior tibial absent bilaterally.  NEUROLOGIC: Nonfocal.  Gait steady.  There is 2+ bilateral muscle strength, 5/5.  ASSESSMENT/PLAN: Aortoiliac occlusive disease, for aortobifemoral vein graft on November 17, 2000 by Dr. Hart Rochester.  Dr. Hart Rochester saw and evaluated this patient prior to admission and has explained the risks and benefits involving the procedure, and the  patient has agreed to continue. DD:  11/15/00 TD:  11/15/00 Job: [id number]

## 2010-09-10 NOTE — H&P (Signed)
NAME:  [name], [name]                          ACCOUNT NO.:  [id number]   MEDICAL RECORD NO.:  [id number]                   PATIENT TYPE:  INP   LOCATION:  6526                                 FACILITY:  [facility]   PHYSICIAN:  Sean A. Everardo All, M.D. [facility]           DATE OF BIRTH:  10/28/1930   DATE OF ADMISSION:  02/04/2002  DATE OF DISCHARGE:                                HISTORY & PHYSICAL   REASON FOR ADMISSION:  Chest pain.   HISTORY OF PRESENT ILLNESS:  The patient is a 77 year old man who states for  the past few months he has been having intermittent moderate chest pain at  the left anterior chest. He describes it as a tightness quality. It does  not necessarily come on in the context of exertion. His last episode was  this morning, and it lasted for several hours; however, it ended several  hours ago.   The patient also was noted on recent laboratory studies to have a hemoglobin  of 8.0. This is presumed due to his history of arteriovenous malformations  of the colon. He last had a hemoglobin on 02/26/01 when his hemoglobin was  10.9.   PAST MEDICAL HISTORY:  1. Type 2 diabetes.  2. Cigarette smoker.  3. Dyslipidemia.  4. GERD.  5. Hemorrhoids.  6. Pancreatitis.  7. Peripheral arterial disease of the lower extremities.   MEDICATIONS:  1. Amaryl 1 mg twice daily.  2. Zocor 10 mg a day.  3. Multivitamin one daily.  4. Aspirin 325 mg daily.  5. Tenormin 50 mg daily.  6. Wellbutrin-XL 150 mg daily (this was prescribed recently; the patient has     not yet started taking it).   SOCIAL HISTORY:  The patient is married. He is retired.   FAMILY HISTORY:  Negative for unexplained GI bleeding or colon disease.   REVIEW OF SYMPTOMS:  Denies the following:  Weight gain, weight loss,  syncope, rectal bleeding, hematuria, abdominal pain, vomiting, heartburn,  and headache.   PHYSICAL EXAMINATION:  VITAL SIGNS:  Blood pressure 120/60, heart rate 84,  respiratory rate 20,  temperature 98 degrees, weight is 156.  GENERAL:  Appears healthy.  SKIN:  Not diaphoretic.  HEENT:  Head is atraumatic, sclerae nonicteric. Oropharynx is clear.  NECK:  Supple.  CHEST:  Clear to auscultation. The chest wall is nontender.  CARDIOVASCULAR:  No JVD. No edema. Regular, rate, and rhythm. No murmur.  Pedal pulses are intact.  ABDOMEN:  Soft, nontender, no hepatosplenomegaly, no mass. There is a healed  midline surgical scar.  RECTAL:  Hemoccult negative. No blood is seen. No bleeding site is seen.  EXTREMITIES:  No ulcers present on the feet. The feet are normal color and  temperature.  NEUROLOGICAL:  Alert, well oriented. The patient moves all fours. His gait  is observed in the office to be normal. Sensation is intact to touch on the  feet.  LABORATORY DATA:  Electrocardiogram:  Occasional supraventricular complex.  Otherwise normal.   IMPRESSION:  1. Chest pain in a patient with risk factors and a history of peripheral     arterial disease. He has a high likelihood of having coronary disease.  2. Moderate anemia which appears to have worsened at some point over the     past year.  3. Other chronic medical problems as noted in the history and physical.   PLAN:  1. Admit to telemetry.  2. Check CPK.  3. Consult cardiology.  4. Transfuse.  5. Chest x-ray.  6. Discuss code status with patient. He requests full code.                                               Sean A. Everardo All, M.D. [facility]    SAE/MEDQ  D:  02/04/2002  T:  02/05/2002  Job:  [id number]

## 2010-09-10 NOTE — Discharge Summary (Signed)
[place]  Patient:    [name], [name]                       MRN: [id number] Adm. Date:  [id number] Disc. Date: 11/22/00 Attending:  Colvin Caroli Dictator:   Sherrie Vidal, P.A. CC:         Veneda Melter, M.D.  Griffith Citron, M.D.  Justine Null, M.D. [facility]   Discharge Summary  ADMISSION DIAGNOSES: 1. Severe aortoiliac occlusive disease and bilateral femoral-popliteal    occlusive disease with bilateral claudication. 2. Adult-onset diabetes mellitus, non-insulin-dependent. 3. Chronic obstructive pulmonary disease with ongoing tobacco use. 4. Gastroesophageal reflux disease. 5. Hypercholesterolemia.  DISCHARGE DIAGNOSES: 1. Aortoiliac occlusive disease and bilateral femoral-popliteal occlusive    disease with bilateral claudication. 2. Anemia with admission hemoglobin and hematocrit of 8.4/24.6. 3. Adult-onset diabetes mellitus, non-insulin-dependent. 4. Gastroesophageal reflux disease. 5. Hypercholesterolemia. 6. Chronic obstructive pulmonary disease with ongoing tobacco use. 7. History of arteriovenous malformation of the colon with colonoscopies    dating back to 1985, by Dr. Sharrell Ku.  PROCEDURE:  Aortobifemoral bypass graft with 14 x 8 mm Dacron Hemashield graft, aortic and right femoral endarterectomies, reimplantation of the inferior mesenteric artery on November 17, 2000, by Dr. Madilyn Fireman.  HISTORY OF PRESENT ILLNESS:  The patient is a 77 year old, African-American male medical patient of Dr. Veneda Melter, Dr. Sharrell Ku and Dr. Romero Belling of [facility].  The patient was seen on November 09, 2000, at [facility] regarding his severe aortoiliac occlusive disease.  He recently underwent cardiac catheterization by Dr. Chales Abrahams.  During the arteriogram, he was noted to have some ischemia in his left leg which required stenting of his left common iliac artery.  He was then referred to Dr. Hart Rochester and he  reviewed the angiogram.  This showed diffuse aortoiliac occlusive disease with total right external iliac artery stenosis, diffuse left common iliac disease with calcifications and total occlusion of the left common femoral artery and moderate stenosis of the left external iliac.  He was also found to have bilateral superficial femoral and popliteal artery disease. Dr. Hart Rochester recommended aortofemoral bypass grafting and left common femoral artery bypass to relieve his claudication.  He was also concerned that he might require femoral-popliteal bypass grafts also.  The patient was discharged from the hospital at that time and is readmitted now for aortofemoral bypass grafting for relief of his severe aortoiliac and femoral occlusive disease.  PAST MEDICAL HISTORY: 1. Aortoiliac occlusive disease claudication. 2. Diabetes, non-insulin-dependent. 3. History of ongoing tobacco abuse. 4. Gastroesophageal reflux disease. 5. Hypercholesterolemia.  PAST SURGICAL HISTORY: 1. Percutaneous transluminal coronary angioplasty and stenting on November 19, 2000, by Dr. Chales Abrahams. 2. Status post cholecystectomy. 3. Status post appendectomy in the past.  MEDICATIONS: 1. Plavix, stopped on November 13, 2000. 2. Amaryl 1 mg b.i.d. 3. Zocor 10 mg q.d. 4. Aspirin 325 mg q.d. 5. Ferrous sulfate 325 mg b.i.d.  ALLERGIES:  No known drug allergies.  For further History and Physical, please see the dictated note.  HOSPITAL COURSE:  The patient was admitted and taken to the operating room on the day of admission and underwent procedure as described above.  He tolerated it well and returned to the recovery room in satisfactory condition and was extubated in the OR.  He was alert and oriented on the postoperative evening with +2 DP and +2 left popliteal pulse.  His feet were warm and  he was hemodynamically stable.  He was followed throughout his hospital course by Dr. Chales Abrahams and had no cardiac problems.  He did  have some postoperative hypertension and tachycardia and this was ultimately treated with Vasotec and Atenolol.  His first postop morning, labs looked good.  The patient was alert and oriented.  Overall, he was doing good.  He continued to make good progress over the next 72 hours.  On November 20, 2000, he had positive bowel sounds and positive flatus.  He was started on liquids and was suppose to be advanced to a soft diet, but that was not accomplished until November 21, 2000.  He has had some diarrhea and has been guaiac positive.  He received four units of packed cells during his surgery along with one unit of cell saver because of his preoperative anemia.  He has been followed by Dr. Kinnie Scales in the past for GI problems which were not elicited on admission.  Dr. Kinnie Scales saw the patient as he has had AV malformations and he has undergone multiple colonoscopies since 1985.  It is his opinion that he is currently quite stable.  He is quite happy with his current status.  It was Dr. Aurora Mask opinion that he will warrant colonoscopy again in the future, but should be able to get over this without any problems prior to that.  He has been advanced to a soft diet.  He is complaining of some discomfort today in his left lower extremity with ambulation.  ABIs obtained postoperatively shows an index of greater than 1 on the right and 0.83 on the left.  Right ABI remained the same as on previous study and the ABI on the left is somewhat diminished.  His wounds are all healing nicely.  His blood pressure is being controlled with Atenolol and Altace.  He was placed back on his preadmission dose of Amaryl 1 mg p.o. b.i.d., coated aspirin 325 mg q.d., Tylox one to two p.o. q.4h. p.r.n., Zocor 10 mg q.h.s., Protonix 40 mg q.d.  His Atenolol is now up to 60 mg a day and his Altace is 2.5 mg a day.  He has this at home.   ACTIVITY:  Light to moderate with no lifting over 10 pounds, no driving or strenuous  activity.  DIET:  Resume preadmission diet.  WOUND CARE:  Clean wounds with plain soap and water.  FOLLOWUP:  He will return on Tuesday, August 6, at 9 a.m. for staple removal. He will return to see Dr. Hart Rochester on Tuesday, August 20, at 10:30 a.m. with ankle branchial indices at the [facility] office.  DISCHARGE LABORATORY DATA AND X-RAY FINDINGS:  Hemoglobin 11.1, hematocrit 33.6, platelets 308,000.  Electrolytes show a sodium of 127, potassium 3.9, chloride 90, CO2 24, BUN 14, creatinine 0.9 and glucose of 170.  We plan to repeat his labs again in the a.m. prior to discharge.  CONDITION ON DISCHARGE:  Improving. DD:  11/21/00 TD:  11/22/00 Job: [id number]

## 2010-09-10 NOTE — Consult Note (Signed)
[facility]                          ENDOCRINOLOGY CONSULTATION   NAME:Lio, NICOLES SEDLACEK                       MRN:          [id number]  DATE:07/12/2006                            DOB:          1931/01/08    REASON FOR VISIT:  Follow up numbness.   HISTORY OF PRESENT ILLNESS:  A 77 year old man, who states the numbness  of the right upper extremity has improved, but is still intermittently  present.   He states the Plavix caused him to have itching.   His iron deficiency anemia persists.   PAST MEDICAL HISTORY:  1. Alcoholism.  2. Diabetes peripheral sensory neuropathy.  3. Diffuse osteoarthritis.  4. ED.  5. Hypertension.  6. PAD.  7. AVM's of the cecum.  8. Colonic polyps.  9. GERD.  10.Dyslipidemia.  11.Cigarette smoker.  12.Type 2 diabetes.   REVIEW OF SYSTEMS:  Denies bleeding from the bowels or bladder.   PHYSICAL EXAMINATION:  VITAL SIGNS:  Blood pressure 131/79, heart 66,  temperature 97.7, the weight is 155.  GENERAL:  In no distress.  Sensation is intact to touch on both upper  extremities.   LABORATORY DATA:  On July 03, 2006, hemoglobin 11.5, iron saturation  16.5%, MRI of the brain is normal.   IMPRESSION:  1. Persistent iron deficiency anemia.  2. Itching due to Plavix.  3. Intermittent numbness, which is concerning for transient ischemic      attack, but could also be due to his neuropathy.   PLAN:  1. I am sending a copy of this note to Dr. Kinnie Scales to be certain that      the patient has had an upper endoscopy in the last few years to be      certain that the GI workup of his iron deficiency anemia is      complete.  2. Change to Plavix to Aggrenox one twice daily.  3. Refer to Neurology.  4. Return 30 days.     Sean A. Everardo All, MD  Electronically Signed    SAE/MedQ  DD: 07/14/2006  DT: 07/14/2006  Job #: [id number]   cc:   Griffith Citron, M.D.

## 2010-09-10 NOTE — Consult Note (Signed)
[place]  Patient:    DRAPER, GALLON                       MRN: [id number] Proc. Date: 11/21/00 Adm. Date:  [id number] Attending:  Colvin Caroli CC:         Quita Skye. Hart Rochester, M.D.  Einar Crow, M.D.  Justine Null, M.D. [facility]   Consultation Report  REASON FOR CONSULTATION:  Mr. Scheck is a 77 year old African-American male whom I am asked to see by Dr. Hart Rochester to evaluate guaiac-positive anemia postoperatively.  HISTORY OF PRESENT ILLNESS:  The patient is well-known to me over the past 15 years.  He has known intestinal and colonic AVMs.  These have been treated previously with photocoagulative ablation.  He is Hemoccult-positive chronically.  This results in iron deficiency anemia when his iron stores are depleted.  In recent months he has been maintained on Plavix because of claudication.  He ultimately underwent aortobifemoral bypass with aortic and right femoral endarterectomy performed by Dr. Hart Rochester July 62, 2002.  His postoperative course has been uncomplicated.  Intraoperatively he was given four units of PRBC due to his anemia.  Acute blood loss intraoperatively was minimal. Postoperatively his hemoglobin has drifted back to his usual baseline ranging 9-10.  Hemoccults have been positive.  Bowel habits are regular, one formed, soft stool daily.  No abdominal pain. No hematochezia, melena, or hematemesis.  No symptoms of dyspepsia, pyrosis, or other GI complaint.  Appetite has remained excellent.  PAST MEDICAL HISTORY: 1. AODM. 2. COPD. 3. GERD. 4. Hyperlipidemia.  PHYSICAL EXAMINATION:  GENERAL:  Healthy, alert, oriented.  HEENT:  Mild pallor.  No oropharyngeal lesion.  ABDOMEN:  Soft, midline surgical incision stapled without evidence of inflammation or infection.  Nontender, nondistended abdomen.  EXTREMITIES:  Full pulses, warm.  ASSESSMENT: 1. Occult gastrointestinal blood loss, secondary to chronic  loss from    intestinal and colonic arteriovenous malformations. 2. Iron deficiency anemia, secondary to occult gastrointestinal blood losses.    Recently exacerbated with antiplatelet therapy and intraoperative blood    loss (minimal). 3. Colon polyp (history of a tubular adenoma).  Last surveillance colonoscopy    2000.  Due for follow-up. 4. History of acute pancreatitis, pancreas divisum, remote.  RECOMMENDATIONS: 1. No need for urgent GI intervention presently. 2. After discharge from postoperative care, anticipated in one to two    months, re-evaluation with a colonoscopy and upper endoscopy for repeat    attempts at [facility] ablation of AVMs indicated.  Also indicated    for surveillance of colon polyps. 3. Iron supplementation. 4. Aspirin to be given because of severe peripheral vascular disease. 5. Protonix or alternative if PPI to be administered prophylactically to try    to minimize occult GI blood loss in a patient with known gastrointestinal    AVMs. DD:  11/21/00 TD:  11/21/00 Job: [id number]

## 2010-09-10 NOTE — Discharge Summary (Signed)
NAME:  [name], [name] NO.:  [id number]   MEDICAL RECORD NO.:  [id number]                   PATIENT TYPE:  INP   LOCATION:  6526                                 FACILITY:  [facility]   PHYSICIAN:  Rene Paci, M.D. [facility]          DATE OF BIRTH:  [birth year]   DATE OF ADMISSION:  02/04/2002  DATE OF DISCHARGE:                                 DISCHARGE SUMMARY   DISCHARGE DIAGNOSES:  1. Chest pain.  2. Tobacco abuse.  3. Microcytic anemia.   HISTORY OF PRESENT ILLNESS:  The patient is a 77 year old African-American  male who presented with a hemoglobin  of 8 and complaints of chest  tightness.   PAST MEDICAL HISTORY:  1. Adult onset diabetes mellitus.  2. Tobacco abuse.  3. Hypercholesteremia.  4. Chronic obstructive pulmonary disease.  5. Peripheral vascular disease, status post bilateral FPOD, treated with     PPA.  6. Aorto bifemoral graft.  7. Gastroesophageal reflux disease.  8. Arteriovenous malformations of the cecum.  9. History of pancreatitis.   HOSPITAL COURSE:  PROBLEM #1, CARDIOVASCULAR:  The patient presented with  chest pain with multiple cardiac risk factors and anemia. The patient was  scheduled for a cardiac catheterization. This was performed on February 06, 2002. The left main showed mild disease. The LAD showed proximal 50%  stenosis,  80% apical and 50% distal. The left circumflex showed 50%. The  RCA showed 100% proximal, 70% PDA. Dr. Urban Gibson impression was that this was  a stable single vessel disease and made recommendations to add Plavix and  treat medically unless symptoms increased. His EF was noted to be 45%. It  may have been that his anemia had elicited some ischemia and chest pain. The  key will be to watch his hemoglobins closely.   PROBLEM #2, MICROCYTIC ANEMIA: The patient did receive 2 units of packed red  blood cells. He has a history of arteriovenous malformations and has  required transfusions in the  past. He will need close hemoglobin monitoring  by his primary physician and transfusions as needed to prevent angina. The  patient also noted that he did receive a letter from Dr. Kinnie Scales indicating  that it is  time for a colonoscopy. We will defer this to an outpatient and  let him arrange this appointment.   LABORATORY DATA:  Labs at discharge were:  Hemoglobin 10, hematocrit 32.4.  BMET was normal.   DISCHARGE MEDICATIONS:  1. Tenormin 50 mg q.d.  2. Zocor 10 mg q.d.  3. Aspirin 325 mg q.d.  4. Plavix 75 mg q.d.  5. Amaryl 1 mg b.i.d.  6. Wellbutrin XL 150 mg q.d.   FOLLOW UP:  The patient will follow up with Dr. Everardo All in two  to three  weeks, with Dr. Chales Abrahams as instructed, and he has been instructed to follow up  with Dr. Kinnie Scales to schedule a colonoscopy.  Cornell Barman, PA [facility]                    Rene Paci, M.D. [facility]    LC/MEDQ  D:  02/06/2002  T:  02/08/2002  Job:  [id number]   cc:   Veneda Melter, M.D. [facility]   Griffith Citron, M.D.   Sean A. Everardo All, M.D. [facility]

## 2010-09-14 ENCOUNTER — Encounter: Payer: Self-pay | Admitting: Cardiovascular Disease

## 2010-09-14 ENCOUNTER — Ambulatory Visit (INDEPENDENT_AMBULATORY_CARE_PROVIDER_SITE_OTHER): Payer: Medicare Other | Admitting: Cardiovascular Disease

## 2010-09-14 VITALS — BP 122/72 | HR 68 | Resp 14 | Ht 74.0 in | Wt 148.8 lb

## 2010-09-14 DIAGNOSIS — I7389 Other specified peripheral vascular diseases: Secondary | ICD-10-CM

## 2010-09-14 DIAGNOSIS — I2510 Atherosclerotic heart disease of native coronary artery without angina pectoris: Secondary | ICD-10-CM

## 2010-09-14 NOTE — Assessment & Plan Note (Signed)
Stable. Continue current therapy.

## 2010-09-14 NOTE — Progress Notes (Signed)
History of Present Illness:77 yo AAM with h/o PVD, CAD, HTN, hyperlipidemia, DM here for followup. His last cardiac cath was in October of 2003 and showed moderate disease. No coronary interventions were performed. He has had no chest pain or SOB. He has a previous aortobifemoral bypass performed by Dr. Hart Rochester in 2002 with endarterectomy of the distal aorta as well as the right common femoral and distal external iliac artery. He was seen  as a new patient in my office for evaluation of bilateral lower extremity fatigue in August of 2010. He told me at the initial visit that his legs both felt tired. There was some pain in the left leg with walking. Marland Kitchen He continued  to smoke one pack per day. He had not kept appointments with Dr. Hart Rochester over the prior 5 years.  His non-invasive imaging in May 2010 demonstrated high grade stenosis of the left profunda with heavy calcificaiton in both SFA, decreased TBI bilaterally. We sent him to see Dr. Hart Rochester and an angiogram was arranged. Dr. Myra Gianotti performed the angiogram on October 14th, 2010. He was found to have patent aorto-bifem bypass graft with total occlusion of the left SFA at the ostium and high grade disease in the ostium of the left profunda femoral artery. There was reconstitution at the popliteal with likely high grade disease in the tibial vessels. He has recently seen Dr. Hart Rochester and non-invasive studies were unchanged.   He is feeling well overall. No chest pain, SOB, palpitations, near syncope or syncope. His legs are not painful with walking. He does describe constant fatigue in the legs. His left foot is cold all of the time. He has not noticed any skin changes or ulcerations. He continues to smoke cigarettes.   Past Medical History  Diagnosis Date  . Hypertension   . Anemia   . PAD (peripheral artery disease) 10/10    Angiogram  Dr Myra Gianotti , severe stenosis left profunda after insertion of aorto-bifemoral  bypass graft-total occulsionof left SFA.  Collateral flow through the profunda femoral artery.  Marland Kitchen CAD in native artery 2003    by cath   . Hypercholesteremia   . Thyroid disease   . Thyroiditis, unspecified   . Osteoarthritis of spine   . Olecranon bursitis   . 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 GERD (gastroesophageal reflux disease)   . Diabetes mellitus   . History of colonic polyps   . Iron deficiency anemia, unspecified   . Leg pain   . Numbness   . History of colonoscopy     Past Surgical History  Procedure Date  . Appendectomy   . Aortobifemoral bypass 2002  . Electrocardiogram 01/12/2006  . Stress cardiolite 04/14/2003    Current Outpatient Prescriptions  Medication Sig Dispense Refill  . atenolol (TENORMIN) 25 MG tablet Take 25 mg by mouth daily.        . calcitRIOL (ROCALTROL) 0.5 MCG capsule Take 0.5 mcg by mouth daily.        Marland Kitchen dipyridamole-aspirin (AGGRENOX) 25-200 MG per 12 hr capsule Take 1 capsule by mouth 2 (two) times daily.        Marland Kitchen ezetimibe-simvastatin (VYTORIN) 10-40 MG per tablet Take 1 tablet by mouth daily.        Marland Kitchen glucose blood (ONE TOUCH ULTRA TEST) test strip 1 each by Other route as directed. Use as instructed       . hydrochlorothiazide 25 MG tablet Take 25 mg by mouth daily.        Marland Kitchen  HYDROcodone-acetaminophen (NORCO) 10-325 MG per tablet Take 1 tablet by mouth every 4 (four) hours as needed.       . insulin lispro protamine-insulin lispro (HUMALOG MIX 75/25 KWIKPEN) (75-25) 100 UNIT/ML SUSP Inject into the skin 2 (two) times daily with meals.        . Insulin Pen Needle (B-D ULTRAFINE III SHORT PEN) 31G X 8 MM MISC by Does not apply route 2 (two) times daily.        . sildenafil (VIAGRA) 100 MG tablet Take 100 mg by mouth as needed.         Allergies  Allergen Reactions  . Clopidogrel Bisulfate     History   Social History  . Marital Status: Married    Spouse Name: N/A    Number of Children: 2  . Years of Education: N/A   Occupational History  .  Hydrologist     Retired   Social History Main Topics  . Smoking status: Current Everyday Smoker -- 1.0 packs/day for 50 years    Types: Cigarettes  . Smokeless tobacco: Not on file  . Alcohol Use: Yes     occasional  . Drug Use: No  . Sexually Active: Not on file   Other Topics Concern  . 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 Kidney disease Sister   . Heart attack Father   . Cancer Neg Hx     Review of Systems:  As stated in the HPI and otherwise negative.   BP 122/72  Pulse 68  Resp 14  Ht 6\' 2"  (1.88 m)  Wt 148 lb 12.8 oz (67.495 kg)  BMI 19.10 kg/m2  Physical Examination: General: Well developed, well nourished, NAD HEENT: OP clear, mucus membranes moist SKIN: warm, dry. No rashes. Neuro: No focal deficits Musculoskeletal: Muscle strength 5/5 all ext Psychiatric: Mood and affect normal Neck: No JVD, no carotid bruits, no thyromegaly, no lymphadenopathy. Lungs:Clear bilaterally, no wheezes, rhonci, crackles Cardiovascular: Regular rate and rhythm. No murmurs, gallops or rubs. Abdomen:Soft. Bowel sounds present. Non-tender.  Extremities: No lower extremity edema. Difficult to palpate pulses in the bilateral DP/PT.   EKG:NSR, rate 68 bpm. 1st degree AV block. RBBB. Possible prior lateral infarction.

## 2010-09-14 NOTE — Assessment & Plan Note (Signed)
Stable. Followed by Dr. Hart Rochester. Non-invasive studies in [facility].

## 2010-09-16 NOTE — Progress Notes (Signed)
Addended by: Floreen Comber on: 09/16/2010 12:43 PM   Modules accepted: Orders

## 2010-10-04 ENCOUNTER — Other Ambulatory Visit (HOSPITAL_COMMUNITY): Payer: Self-pay | Admitting: Nephrology

## 2010-10-11 ENCOUNTER — Other Ambulatory Visit: Payer: Self-pay | Admitting: *Deleted

## 2010-10-11 ENCOUNTER — Other Ambulatory Visit: Payer: Self-pay | Admitting: Endocrinology

## 2010-10-11 MED ORDER — HYDROCODONE-ACETAMINOPHEN 10-325 MG PO TABS: 1.0000 | ORAL_TABLET | ORAL | Status: DC | PRN

## 2010-10-11 NOTE — Telephone Encounter (Signed)
R'cd fax from [facility] for refill of pt's Hydrocodone-APAP-please advise

## 2010-10-11 NOTE — Telephone Encounter (Signed)
i printed 

## 2010-10-12 ENCOUNTER — Encounter (HOSPITAL_COMMUNITY)
Admission: RE | Admit: 2010-10-12 | Discharge: 2010-10-12 | Disposition: A | Discharge status: Home / Self Care | Payer: Medicare Other | Source: Ambulatory Visit | Attending: Nephrology | Admitting: Nephrology

## 2010-10-12 DIAGNOSIS — N183 Chronic kidney disease, stage 3 unspecified: Secondary | ICD-10-CM | POA: Insufficient documentation

## 2010-10-12 MED ORDER — TECHNETIUM TC 99M MERTIATIDE
15.0000 | Freq: Once | INTRAVENOUS | Status: AC | PRN
  Administered 2010-10-12: 15 via INTRAVENOUS

## 2010-10-12 NOTE — Telephone Encounter (Signed)
Rx faxed to [facility]

## 2010-10-19 ENCOUNTER — Other Ambulatory Visit: Payer: Self-pay | Admitting: *Deleted

## 2010-10-19 ENCOUNTER — Ambulatory Visit (INDEPENDENT_AMBULATORY_CARE_PROVIDER_SITE_OTHER): Payer: Medicare Other | Admitting: Vascular Surgery

## 2010-10-19 DIAGNOSIS — I70219 Atherosclerosis of native arteries of extremities with intermittent claudication, unspecified extremity: Secondary | ICD-10-CM

## 2010-10-19 MED ORDER — HYDROCHLOROTHIAZIDE 25 MG PO TABS: 25.0000 mg | ORAL_TABLET | Freq: Every day | ORAL | Status: DC

## 2010-10-20 NOTE — Assessment & Plan Note (Signed)
OFFICE VISIT  [name], [name] DOB:  01-20-1931                                       10/19/2010 UJWJX#:[id number]  This is a 77 year old male well known to our practice having previously undergone aortobifemoral bypass grafting by me in 2002.  He has a known left superficial femoral occlusion.  His most recent ABI had been 0.26 in October 2011 and 0.47 on the right.  He had been having stable claudication symptoms for the last few years.  He is currently only able to walk about 1/2 to 1 block before stopping because both of the legs feel very weak.  He has no history of rest pain, nonhealing ulcers, infection, gangrene, or cellulitis.  He does have some tingling in his feet at times.  CHRONIC MEDICAL PROBLEMS: 1. Tobacco abuse:  Pack per day for 60 years. 2. Nonfunctioning right kidney recently evaluated by Dr. Camille Bal. 3. Hypertension. 4. Hyperlipidemia. 5. Coronary artery disease. 6. Diabetes mellitus. 7. GERD. 8. Negative for stroke.  SOCIAL HISTORY:  He is married and has 2 children.  He smoked a pack a day of cigarettes for 60 years.  He does not use alcohol.  FAMILY HISTORY:  Negative for coronary artery disease, diabetes, and stroke.  REVIEW OF SYSTEMS:  He denies any chest pain.  All systems are negative in a complete review of systems.  PHYSICAL EXAMINATION:  Vital Signs:  Blood pressure 160/65, heart rate 72, respirations 20.  General:  He is a thin, well-developed male in no apparent distress, alert and oriented x3.  HEENT Exam:  Normal for age. EOMs intact.  Lungs:  Clear to auscultation.  No rhonchi or wheezing. Cardiovascular Exam:  Regular rhythm, no murmurs.  Carotid pulses 3+. No audible bruits.  Abdomen:  Soft, nontender with no masses. Musculoskeletal Exam:  Free of major deformities.  Neurologic Exam: Normal.  Lower Extremity Exam:  3+ femoral pulses bilaterally with no popliteal or distal pulses.  Both feet are  cool but well perfused. There is no evidence of any distal edema or infection.  Lower extremity arterial status is stable with known superficial femoral and tibial occlusive disease, and stable claudication.  He will continue to be followed in our lab on a regular basis returning in November 2012 for further ABIs.  If his symptoms worsen, will be in touch with [place] as he is a candidate for bilateral femoral-popliteal bypass grafting.  He is currently being evaluated for his renal disease by Dr. Eliott Nine.    Quita Skye. Hart Rochester, M.D. Electronically Signed  JDL/MEDQ  D:  10/19/2010  T:  10/20/2010  Job:  [id number]

## 2010-10-26 ENCOUNTER — Ambulatory Visit (INDEPENDENT_AMBULATORY_CARE_PROVIDER_SITE_OTHER): Payer: Medicare Other | Admitting: Endocrinology

## 2010-10-26 ENCOUNTER — Other Ambulatory Visit (INDEPENDENT_AMBULATORY_CARE_PROVIDER_SITE_OTHER): Payer: Medicare Other

## 2010-10-26 ENCOUNTER — Encounter: Payer: Self-pay | Admitting: Endocrinology

## 2010-10-26 VITALS — BP 124/62 | HR 77 | Temp 97.9°F | Ht 74.0 in | Wt 144.1 lb

## 2010-10-26 DIAGNOSIS — E1049 Type 1 diabetes mellitus with other diabetic neurological complication: Secondary | ICD-10-CM

## 2010-10-26 LAB — HEMOGLOBIN A1C: Hgb A1c MFr Bld: 8.3 % — ABNORMAL HIGH (ref 4.6–6.5)

## 2010-10-26 MED ORDER — ATENOLOL 25 MG PO TABS: 25.0000 mg | ORAL_TABLET | Freq: Every day | ORAL | Status: DC

## 2010-10-26 MED ORDER — VARENICLINE TARTRATE 0.5 MG PO TABS: ORAL_TABLET | ORAL | Status: DC

## 2010-10-26 NOTE — Patient Instructions (Addendum)
blood tests are being ordered for you today.  please call [phone] to hear your test results.  You will be prompted to enter the 9-digit "MRN" number that appears at the top left of this page, followed by #.  Then you will hear the message. pending the test results, please increase insulin to 12 units with breakfast, and 9 with the evening meal.   good diet and exercise habits significanly improve the control of your diabetes.  please let me know if you wish to be referred to a dietician.  high blood sugar is very risky to your health.  you should see an eye doctor every year. controlling your blood pressure and cholesterol drastically reduces the damage diabetes does to your body.  this also applies to quitting smoking.  please discuss these with your doctor.  you should take an aspirin every day, unless you have been advised by a doctor not to. Please make a follow-up appointment in 3 months i have sent a prescription to your pharmacy, for quitting smoking.

## 2010-10-26 NOTE — Progress Notes (Signed)
Subjective:    Patient ID: [name], male    DOB: 11/10/1930, 77 y.o.   MRN: [id number]  HPI Pt recently saw nephrol for his renal dz.  pt states he feels well in general.  no cbg record, but states cbg's are well-controlled.  He says cbg's are in general higher later in the day.   Past Medical History  Diagnosis Date  . Hypertension   . Anemia   . PAD (peripheral artery disease) 10/10    Angiogram  Dr Myra Gianotti , severe stenosis left profunda after insertion of aorto-bifemoral  bypass graft-total occulsionof left SFA. Collateral flow through the profunda femoral artery.  Marland Kitchen CAD in native artery 2003    by cath   . Hypercholesteremia   . Thyroid disease   . Thyroiditis, unspecified   . Osteoarthritis of spine   . Olecranon bursitis   . 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 GERD (gastroesophageal reflux disease)   . Diabetes mellitus   . History of colonic polyps   . Iron deficiency anemia, unspecified   . Leg pain   . Numbness   . History of colonoscopy     Past Surgical History  Procedure Date  . Appendectomy   . Aortobifemoral bypass 2002  . Electrocardiogram 01/12/2006  . Stress cardiolite 04/14/2003    History   Social History  . Marital Status: Married    Spouse Name: N/A    Number of Children: 2  . Years of Education: N/A   Occupational History  . Hydrologist     Retired   Social History Main Topics  . Smoking status: Current Everyday Smoker -- 1.0 packs/day for 50 years    Types: Cigarettes  . Smokeless tobacco: Not on file  . Alcohol Use: Yes     occasional  . Drug Use: No  . Sexually Active: Not on file   Other Topics Concern  . 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 atenolol (TENORMIN) 25 MG tablet Take 25 mg by mouth  daily.        . calcitRIOL (ROCALTROL) 0.5 MCG capsule Take 0.5 mcg by mouth daily.        Marland Kitchen dipyridamole-aspirin (AGGRENOX) 25-200 MG per 12 hr capsule Take 1 capsule by mouth 2 (two) times daily.        Marland Kitchen ezetimibe-simvastatin (VYTORIN) 10-40 MG per tablet Take 1 tablet by mouth daily.        Marland Kitchen glucose blood (ONE TOUCH ULTRA TEST) test strip 1 each by Other route as directed. Use as instructed       . hydrochlorothiazide 25 MG tablet Take 1 tablet (25 mg total) by mouth daily.  90 tablet  3  . HYDROcodone-acetaminophen (NORCO) 10-325 MG per tablet Take 1 tablet by mouth every 4 (four) hours as needed.  50 tablet  3  . insulin lispro protamine-insulin lispro (HUMALOG MIX 75/25 KWIKPEN) (75-25) 100 UNIT/ML SUSP Inject into the skin 2 (two) times daily with a meal. 12 units with breakfast, and 9 units with the evening meal.      . Insulin Pen Needle (B-D ULTRAFINE III SHORT PEN) 31G X 8 MM MISC by Does not apply route 2 (two) times daily.        . sildenafil (VIAGRA) 100 MG tablet Take  100 mg by mouth as needed.         Allergies  Allergen Reactions  . Clopidogrel Bisulfate     Family History  Problem Relation Age of Onset  . Kidney disease Sister   . Heart attack Father   . Cancer Neg Hx     BP 124/62  Pulse 77  Temp(Src) 97.9 F (36.6 C) (Oral)  Ht 6\' 2"  (1.88 m)  Wt 144 lb 1.9 oz (65.372 kg)  BMI 18.50 kg/m2  SpO2 98%    Review of Systems denies hypoglycemia    Objective:   Physical Exam GENERAL: no distress SKIN: Insulin injection sites at the anterior abdomen are normal    Assessment & Plan:  Dm, needs increased rx Smoker, very important to quit in view of atherosclerosis.  We discussed the risks of smoking, quitting strategies, and i offered rx for chantix.  Pt agrees.  5 minutes.

## 2010-11-03 ENCOUNTER — Other Ambulatory Visit: Payer: Self-pay | Admitting: *Deleted

## 2010-11-03 MED ORDER — ATENOLOL 25 MG PO TABS: 25.0000 mg | ORAL_TABLET | Freq: Every day | ORAL | Status: DC

## 2010-11-03 NOTE — Telephone Encounter (Signed)
R'cd fax from [facility] order pharmacy for refill of pt's Atenolol

## 2010-11-09 ENCOUNTER — Other Ambulatory Visit: Payer: Self-pay | Admitting: Endocrinology

## 2010-11-12 NOTE — Assessment & Plan Note (Signed)
OFFICE VISIT  RANVEER, WAHLSTROM DOB:  02-Sep-1930                                       11/08/2010 WUJWJ#:[id number]  I spoke with Dr. Camille Bal today regarding the patient.  He has a left renal mass that is going to need to be addressed either with surgery or ablation.  In addition, I studied him in October of 2010 for claudication.  At that time he was found to have 70% right renal stenosis and 40% left renal stenosis.  Since that time his creatinine has increased.  It is now in the 2 range.  A GFR scan shows that 70% of his renal function is coming from his left kidney.  Since his left kidney is most likely going to have to be removed or partially ablated we are trying to salvage his renal function and knowing that he has a high-grade right renal stenosis I have been asked to proceed with angiogram and stenting of the right renal artery.  I will also plan on studying the left renal artery.  This will be done with CO2 and minimal use of contrast.  Our office will contact the patient in the immediate future to schedule this.    Jorge Ny, MD Electronically Signed  VWB/MEDQ  D:  11/08/2010  T:  11/09/2010  Job:  [id number]

## 2010-11-16 ENCOUNTER — Ambulatory Visit (HOSPITAL_COMMUNITY)
Admission: RE | Admit: 2010-11-16 | Discharge: 2010-11-16 | Disposition: A | Discharge status: Home / Self Care | Payer: Medicare Other | Source: Ambulatory Visit | Attending: Surgery | Admitting: Surgery

## 2010-11-16 DIAGNOSIS — E785 Hyperlipidemia, unspecified: Secondary | ICD-10-CM | POA: Insufficient documentation

## 2010-11-16 DIAGNOSIS — I739 Peripheral vascular disease, unspecified: Secondary | ICD-10-CM | POA: Insufficient documentation

## 2010-11-16 DIAGNOSIS — Z01812 Encounter for preprocedural laboratory examination: Secondary | ICD-10-CM | POA: Insufficient documentation

## 2010-11-16 DIAGNOSIS — Z79899 Other long term (current) drug therapy: Secondary | ICD-10-CM | POA: Insufficient documentation

## 2010-11-16 DIAGNOSIS — K219 Gastro-esophageal reflux disease without esophagitis: Secondary | ICD-10-CM | POA: Insufficient documentation

## 2010-11-16 DIAGNOSIS — I10 Essential (primary) hypertension: Secondary | ICD-10-CM | POA: Insufficient documentation

## 2010-11-16 DIAGNOSIS — I701 Atherosclerosis of renal artery: Secondary | ICD-10-CM

## 2010-11-16 DIAGNOSIS — N289 Disorder of kidney and ureter, unspecified: Secondary | ICD-10-CM | POA: Insufficient documentation

## 2010-11-16 DIAGNOSIS — I771 Stricture of artery: Secondary | ICD-10-CM | POA: Insufficient documentation

## 2010-11-16 HISTORY — PX: RENAL ARTERY STENT: SHX2321

## 2010-11-16 LAB — POCT ACTIVATED CLOTTING TIME
Activated Clotting Time: 204 seconds
Activated Clotting Time: 221 seconds

## 2010-11-16 LAB — POCT I-STAT, CHEM 8
BUN: 31 mg/dL — ABNORMAL HIGH (ref 6–23)
Calcium, Ion: 1.18 mmol/L (ref 1.12–1.32)
Creatinine, Ser: 1.7 mg/dL — ABNORMAL HIGH (ref 0.50–1.35)
TCO2: 21 mmol/L (ref 0–100)

## 2010-11-16 LAB — GLUCOSE, CAPILLARY: Glucose-Capillary: 201 mg/dL — ABNORMAL HIGH (ref 70–99)

## 2010-12-10 IMAGING — CR DG ABDOMEN ACUTE W/ 1V CHEST
1 series · 1 of 1 positions shown · non-contrast
Comparison: 10/02/2007 chest x-ray

CLINICAL DATA: Abdominal pain and nausea and vomiting.

ACUTE ABDOMEN SERIES (ABDOMEN 2 VIEW & CHEST 1 VIEW)

[view not recorded]
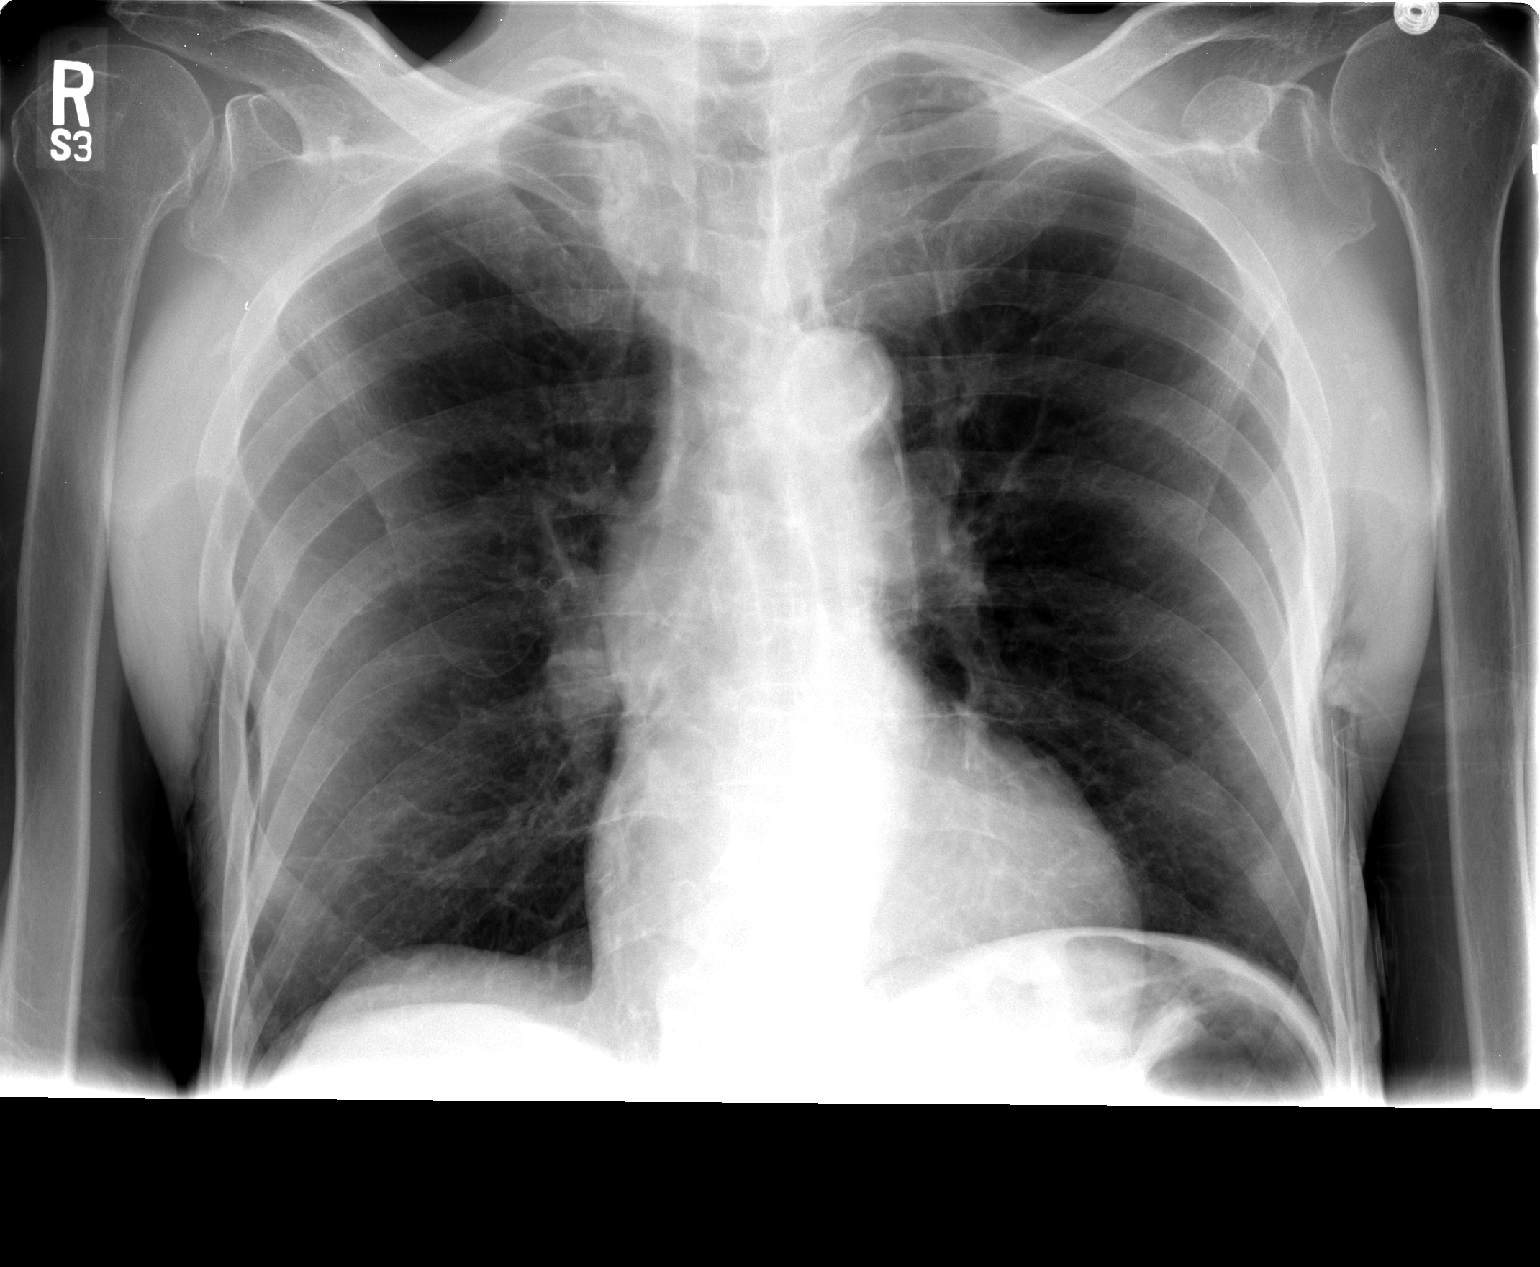

[1 of 1 positions shown; findings below may reference images not displayed]

FINDINGS: Cardiomegaly and COPD/emphysema noted.
There is no evidence of focal airspace disease, pleural effusion,
or pneumothorax.

Dilated fluid and gas filled loops of small bowel are compatible
with small bowel obstruction.
There is no evidence of pneumoperitoneum.
Evidence of previous abdominal/pelvic surgery noted as well as a
left iliac stent.
IMPRESSION: Small bowel obstruction without evidence of pneumoperitoneum.

Cardiomegaly and COPD/emphysema without evidence of acute
cardiopulmonary disease.

## 2010-12-20 ENCOUNTER — Ambulatory Visit: Payer: Medicare Other | Admitting: Surgery

## 2010-12-20 ENCOUNTER — Other Ambulatory Visit: Payer: Medicare Other

## 2010-12-23 NOTE — Op Note (Signed)
NAME:  [name], [name] NO.:  [id number]  MEDICAL RECORD NO.:  [id number]  LOCATION:  [facility]                         FACILITY:  [facility]  PHYSICIAN:  Juleen China IV, MDDATE OF BIRTH:  [birth year]  DATE OF PROCEDURE:  11/16/2010 DATE OF DISCHARGE:  11/16/2010                              OPERATIVE REPORT   SURGEON: 1. Durene Cal IV, MD  PREOPERATIVE DIAGNOSIS:  Right renal artery stenosis.  POSTOPERATIVE DIAGNOSIS:  Right renal artery stenosis.  PROCEDURES PERFORMED: 1. Aortogram with CO2. 2. First-order catheterization (right renal artery). 3. Right renal angiogram. 4. Right renal artery stent.  FINDINGS:  99% right renal artery stenosis.  INDICATIONS:  [name] is a 77 year old gentleman with having undergone aortobifemoral bypass graft in the past.  He has developed a left renal mass, which most likely require removing as most, but not all his left kidney and in previous angiogram, he was found to have right renal artery stenosis.  His right renal artery is not contributing significantly to his overall renal function; therefore, we were contemplating stenting to minimize the risk for him requiring dialysis after his left kidney removal.  PROCEDURE:  The patient was identified in the holding are and taken to room #8, placed supine on the table.  The right groin was prepped in usual fashion.  Time-out was called.  The right limb of the aortobifemoral bypass graft was evaluated with ultrasound.  A digital ultrasound image was acquired, it was accessed under ultrasound guidance with an 18-gauge needle.  A Bentson wire was then advanced into the aorta under fluoroscopic visualization followed by 5-French end-hole catheter.  A wire exchange was performed and an Amplatz superstiff wire was placed.  I then placed a 6-French sheath.  An Omniflush catheter was advanced into the aorta and aortogram with CO2 was performed, then to give better imaging of the  right renal artery, which was not well visualized with CO2.  I used IMA guide cath to select the right renal artery and a right renal angiogram was performed.  FINDINGS:  Aortogram:  The visualized portion of the suprarenal abdominal aorta showed no significant disease.  The infrarenal abdominal aorta is widely patent aorta.  An aortic graft is visualized with the main body in the right and left nodes being widely patent.  The left renal artery is somewhat difficult to visualize due to overlying bowel gas; however, it does appear to be patent without significant stenosis.  Right renal artery angiogram:  A 99% stenosis is seen at the origin of the right renal artery.  After the above images were acquired, the patient was fully heparinized. I elected to proceed, I tried to advance a stent across the lesion. Having placed a wire out into the renal artery, I was unable to advance the stent as it met resistance and therefore, I predilated this area with a 4-mm balloon.  I then used a Cordis Blue 6 x 15 balloon expandable stent, was deployed into the renal artery across the stenosis with an overlapping out into the aorta.  It was taken to 8-10 atmospheres.  Completion study was then performed.  Based on that study, I elected to  re-dilate the stents with the stent balloon.  A final study was acquired, which showed widely patent right renal artery with no stenosis.  The catheters and wires were removed.  The patient was taken to holding area for sheath pull.  There were no complications.  IMPRESSION: 1. Success stenting of the right renal artery, which had approximately     99% stenosis, post-stent stenosis was less than 5%. 2. The left renal artery was not well visualized today due to     overlying bowel gas.     Steve Ny, MD     VWB/MEDQ  D:  11/16/2010  T:  11/16/2010  Job:  [id number]  cc:   Duke Salvia. Eliott Nine, M.D.  Electronically Signed by Arelia Longest IV MD on  12/22/2010 11:59:01 PM

## 2010-12-28 ENCOUNTER — Encounter: Payer: Self-pay | Admitting: Surgery

## 2010-12-29 ENCOUNTER — Other Ambulatory Visit: Payer: Self-pay | Admitting: *Deleted

## 2010-12-29 MED ORDER — INSULIN LISPRO PROT & LISPRO (75-25 MIX) 100 UNIT/ML ~~LOC~~ SUSP: SUBCUTANEOUS | Status: DC

## 2010-12-29 NOTE — Telephone Encounter (Signed)
R'cd fax from [facility] for refill of Humalog 75/25  Last OV-10/26/2010

## 2011-01-20 LAB — CBC
Hemoglobin: 12.2 — ABNORMAL LOW
RBC: 4.18 — ABNORMAL LOW

## 2011-01-20 LAB — POCT CARDIAC MARKERS
Myoglobin, poc: 57.3
Operator id: 146091

## 2011-01-20 LAB — POCT I-STAT, CHEM 8
Creatinine, Ser: 1.1
Glucose, Bld: 384 — ABNORMAL HIGH
Hemoglobin: 13.3
Potassium: 3.7

## 2011-01-20 LAB — PROTIME-INR
INR: 1
Prothrombin Time: 13.4

## 2011-01-25 ENCOUNTER — Other Ambulatory Visit (INDEPENDENT_AMBULATORY_CARE_PROVIDER_SITE_OTHER): Payer: Medicare Other

## 2011-01-25 ENCOUNTER — Other Ambulatory Visit: Payer: Self-pay | Admitting: Cardiovascular Disease

## 2011-01-25 ENCOUNTER — Ambulatory Visit (INDEPENDENT_AMBULATORY_CARE_PROVIDER_SITE_OTHER): Payer: Medicare Other | Admitting: Endocrinology

## 2011-01-25 ENCOUNTER — Encounter: Payer: Self-pay | Admitting: Endocrinology

## 2011-01-25 VITALS — BP 126/76 | HR 65 | Temp 97.8°F | Ht 73.0 in | Wt 143.0 lb

## 2011-01-25 DIAGNOSIS — E1049 Type 1 diabetes mellitus with other diabetic neurological complication: Secondary | ICD-10-CM

## 2011-01-25 DIAGNOSIS — I739 Peripheral vascular disease, unspecified: Secondary | ICD-10-CM

## 2011-01-25 NOTE — Progress Notes (Signed)
Subjective:    Patient ID: [name], male    DOB: 07-02-1930, 77 y.o.   MRN: [id number]  HPI Pt returns for f/u of adult-onset dm (1996), type 1 vs due to pancreatic insufficiency.  he denies diarrhea.  no cbg record, but states cbg's are well-controlled.  He says cbg is highest at noon, and lowest in am.   Past Medical History  Diagnosis Date  . Hypertension   . Anemia   . PAD (peripheral artery disease) 10/10    Angiogram  Dr Myra Gianotti , severe stenosis left profunda after insertion of aorto-bifemoral  bypass graft-total occulsionof left SFA. Collateral flow through the profunda femoral artery.  Marland Kitchen CAD in native artery 2003    by cath   . Hypercholesteremia   . Thyroid disease   . Thyroiditis, unspecified   . Osteoarthritis of spine   . Olecranon bursitis   . 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 GERD (gastroesophageal reflux disease)   . Diabetes mellitus   . History of colonic polyps   . Iron deficiency anemia, unspecified   . Leg pain   . Numbness   . History of colonoscopy   . Chronic kidney disease     Past Surgical History  Procedure Date  . Appendectomy   . Aortobifemoral bypass 2002  . Electrocardiogram 01/12/2006  . Stress cardiolite 04/14/2003  . Pr vein bypass graft,aorto-fem-pop 2002  . Cholecystectomy     History   Social History  . Marital Status: Married    Spouse Name: N/A    Number of Children: 2  . Years of Education: N/A   Occupational History  . Hydrologist     Retired   Social History Main Topics  . Smoking status: Current Everyday Smoker -- 1.0 packs/day for 60 years    Types: Cigarettes  . Smokeless tobacco: Not on file  . Alcohol Use: Yes     occasional  . Drug Use: No  . Sexually Active: Not on file   Other Topics Concern  . 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 AGGRENOX 25-200 MG per 12 hr capsule TAKE 1 CAPSULE TWICE A DAY  180 capsule  1  . atenolol (TENORMIN) 25 MG tablet Take 1 tablet (25 mg total) by mouth daily.  90 tablet  3  . calcitRIOL (ROCALTROL) 0.5 MCG capsule Take 0.5 mcg by mouth daily.        Marland Kitchen ezetimibe-simvastatin (VYTORIN) 10-40 MG per tablet Take 1 tablet by mouth daily.        Marland Kitchen glucose blood (ONE TOUCH ULTRA TEST) test strip 1 each by Other route as directed. Use as instructed       . hydrochlorothiazide 25 MG tablet Take 1 tablet (25 mg total) by mouth daily.  90 tablet  3  . HYDROcodone-acetaminophen (NORCO) 10-325 MG per tablet Take 1 tablet by mouth every 4 (four) hours as needed.  50 tablet  3  . insulin lispro protamine-insulin lispro (HUMALOG MIX 75/25 KWIKPEN) (75-25) 100 UNIT/ML SUSP Inject into the skin 2 (two) times daily with a meal, 12 units with breakfast, and 9 units with the evening meal.  20 mL  2  . Insulin Pen Needle (B-D ULTRAFINE III SHORT PEN) 31G X 8 MM MISC by Does not apply route  2 (two) times daily.        . sildenafil (VIAGRA) 100 MG tablet Take 100 mg by mouth as needed.       . varenicline (CHANTIX) 0.5 MG tablet First month 0.5 mg.  Subsequent month with 1 mg please.  180 tablet  1    Allergies  Allergen Reactions  . Clopidogrel Bisulfate Hives    Family History  Problem Relation Age of Onset  . Kidney disease Sister   . Heart attack Father   . Coronary artery disease Father   . Cancer Neg Hx     BP 126/76  Pulse 65  Temp(Src) 97.8 F (36.6 C) (Oral)  Ht 6\' 1"  (1.854 m)  Wt 143 lb (64.864 kg)  BMI 18.87 kg/m2  SpO2 98%    Review of Systems denies hypoglycemia.    Objective:   Physical Exam Pulses: dorsalis pedis intact bilat.   Feet: no deformity.  no ulcer on the feet.  feet are of normal color and temp.  no edema Neuro: sensation is intact to touch on the feet        Assessment & Plan:  Dm, needs increased rx.   therapy limited by pt's need for a simple regimen Chronic lean body habitus.  i am unaware of pt having pancreatic insufficiency.

## 2011-01-25 NOTE — Patient Instructions (Addendum)
blood tests are being ordered for you today.  please call [phone] to hear your test results.  You will be prompted to enter the 9-digit "MRN" number that appears at the top left of this page, followed by #.  Then you will hear the message. pending the test results, please increase insulin to 13 units with breakfast, and 9 with the evening meal.   Please come back for a follow-up appointment in January. In my opinion, the next step in the evaluation of your weight-loss would be seeing dr Kinnie Scales, to see if your pancreas is properly making digestive enzymes.

## 2011-01-26 ENCOUNTER — Other Ambulatory Visit: Payer: Self-pay

## 2011-01-26 MED ORDER — HYDROCHLOROTHIAZIDE 25 MG PO TABS: 25.0000 mg | ORAL_TABLET | Freq: Every day | ORAL | Status: DC

## 2011-01-28 ENCOUNTER — Other Ambulatory Visit: Payer: Self-pay | Admitting: *Deleted

## 2011-01-28 ENCOUNTER — Telehealth: Payer: Self-pay | Admitting: Endocrinology

## 2011-01-28 MED ORDER — HYDROCODONE-ACETAMINOPHEN 10-325 MG PO TABS: 1.0000 | ORAL_TABLET | ORAL | Status: DC | PRN

## 2011-01-28 NOTE — Telephone Encounter (Signed)
R'cd fax from [facility] for refill of Hydrocodone-APAP 10-325 mg last written 10/11/2010 #50 with refills-please advise

## 2011-01-28 NOTE — Telephone Encounter (Signed)
Rx faxed to [facility].

## 2011-01-28 NOTE — Telephone Encounter (Signed)
i left message on phone tree Increase am-insulin to 14 rather than 13

## 2011-02-07 ENCOUNTER — Ambulatory Visit (INDEPENDENT_AMBULATORY_CARE_PROVIDER_SITE_OTHER): Payer: Medicare Other | Admitting: Vascular Surgery

## 2011-02-07 ENCOUNTER — Encounter: Payer: Self-pay | Admitting: Surgery

## 2011-02-07 ENCOUNTER — Ambulatory Visit (INDEPENDENT_AMBULATORY_CARE_PROVIDER_SITE_OTHER): Payer: Medicare Other | Admitting: Surgery

## 2011-02-07 VITALS — BP 154/77 | HR 55 | Resp 16 | Ht 74.0 in | Wt 146.0 lb

## 2011-02-07 DIAGNOSIS — I701 Atherosclerosis of renal artery: Secondary | ICD-10-CM

## 2011-02-07 DIAGNOSIS — Z48812 Encounter for surgical aftercare following surgery on the circulatory system: Secondary | ICD-10-CM

## 2011-02-07 NOTE — Progress Notes (Signed)
Addended by: Sharee Pimple on: 02/07/2011 11:04 AM   Modules accepted: Orders

## 2011-02-07 NOTE — Progress Notes (Signed)
[facility] [place]   Patient name: [name] MRN: [id number] DOB: Jul 21, 1930 Sex: male     Chief Complaint  Patient presents with  . Follow-up    Renal artery stenosis  ( Stenting done 11-16-10)   Pt has no complaints.  Vascular lab done today.    HISTORY OF PRESENT ILLNESS: The patient comes back in today for followup he is a patient of Dr. Hart Rochester who underwent aortobifemoral bypass grafting in 2002. His most recent ABIs were 0.26 the left and 0.47 on the right. He has had stable claudication symptoms. He is without ulcers or rest pain. He does take occasional pain pill for pain in his right leg. I was recently asked by Dr. Eliott Nine to evaluate his renal disease. He has a poorly functioning right kidney with a high-grade right renal stenosis. He has a left renal mass which might require nephrectomy and therefore to maximize his chance for remaining dialysis 3 he underwent right renal artery stenting on 11/16/2010 interventional findings included a 99% right renal artery stenosis. The patient had no complicating factors from his procedure and for Thursday for followup ultrasound he tells me now that there is a possibility they may freeze the spot on his left kidney most likely in February.  Past Medical History  Diagnosis Date  . Hypertension   . Anemia   . PAD (peripheral artery disease) 10/10    Angiogram  Dr Myra Gianotti , severe stenosis left profunda after insertion of aorto-bifemoral  bypass graft-total occulsionof left SFA. Collateral flow through the profunda femoral artery.  Marland Kitchen CAD in native artery 2003    by cath   . Hypercholesteremia   . Thyroid disease   . Thyroiditis, unspecified   . Osteoarthritis of spine   . Olecranon bursitis   . 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 GERD (gastroesophageal reflux disease)   . Diabetes mellitus   . History of colonic polyps   . Iron deficiency anemia, unspecified   . Leg pain   . Numbness     . History of colonoscopy   . Chronic kidney disease     Past Surgical History  Procedure Date  . Appendectomy   . Aortobifemoral bypass 2002  . Electrocardiogram 01/12/2006  . Stress cardiolite 04/14/2003  . Pr vein bypass graft,aorto-fem-pop 2002  . Cholecystectomy   . Renal artery stent 11/16/2010    Right renal artery    History   Social History  . Marital Status: Married    Spouse Name: N/A    Number of Children: 2  . Years of Education: N/A   Occupational History  . Hydrologist     Retired   Social History Main Topics  . Smoking status: Current Everyday Smoker -- 1.0 packs/day for 60 years    Types: Cigarettes  . Smokeless tobacco: Not on file  . Alcohol Use: No  . Drug Use: No  . Sexually Active: Not on file   Other Topics Concern  . 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 Kidney disease Sister   . Heart attack Father   . Coronary artery disease Father   . Cancer Neg Hx     Allergies as of 02/07/2011 - Review Complete 02/07/2011  Allergen Reaction Noted  . Clopidogrel bisulfate Hives     Current Outpatient  Prescriptions on File Prior to Visit  Medication Sig Dispense Refill  . AGGRENOX 25-200 MG per 12 hr capsule TAKE 1 CAPSULE TWICE A DAY  180 capsule  1  . atenolol (TENORMIN) 25 MG tablet Take 1 tablet (25 mg total) by mouth daily.  90 tablet  3  . calcitRIOL (ROCALTROL) 0.5 MCG capsule Take 0.5 mcg by mouth daily.        Marland Kitchen ezetimibe-simvastatin (VYTORIN) 10-40 MG per tablet Take 1 tablet by mouth daily.        Marland Kitchen glucose blood (ONE TOUCH ULTRA TEST) test strip 1 each by Other route as directed. Use as instructed       . hydrochlorothiazide (HYDRODIURIL) 25 MG tablet Take 1 tablet (25 mg total) by mouth daily.  30 tablet  11  . HYDROcodone-acetaminophen (NORCO) 10-325 MG per tablet Take 1 tablet  by mouth every 4 (four) hours as needed.  50 tablet  3  . insulin lispro protamine-insulin lispro (HUMALOG 75/25) (75-25) 100 UNIT/ML SUSP Inject into the skin 2 (two) times daily with a meal, 13 units with breakfast, and 9 units with the evening meal.       . Insulin Pen Needle (B-D ULTRAFINE III SHORT PEN) 31G X 8 MM MISC by Does not apply route 2 (two) times daily.        . sildenafil (VIAGRA) 100 MG tablet Take 100 mg by mouth as needed.       . varenicline (CHANTIX) 0.5 MG tablet First month 0.5 mg.  Subsequent month with 1 mg please.  180 tablet  1     REVIEW OF SYSTEMS: No changes  PHYSICAL EXAMINATION: General: The patient appears their stated age.  Vital signs are BP 154/77  Pulse 55  Resp 16  Ht 6\' 2"  (1.88 m)  Wt 146 lb (66.225 kg)  BMI 18.75 kg/m2  SpO2 98% Pulmonary: There is a good air exchange bilaterally without wheezing or rales. Abdomen: Soft and non-tender with normal pitch bowel sounds. Musculoskeletal: There are no major deformities.  There is no significant extremity pain. Neurologic: No focal weakness or paresthesias are detected, Skin: There are no ulcer or rashes noted. Psychiatric: The patient has normal affect. Cardiovascular: There is a regular rate and rhythm without significant murmur appreciated. Groin access site is stable   Diagnostic Studies Renal duplex was ordered and reviewed by myself this shows patent bilateral renal arteries with renal aortic ratio less than 3.5 an estimated stenosis in the 1-59% range the right renal stent was not visualized very well due to bowel gas and his prior surgery  Assessment: #1 right renal artery stenosis  #2 claudication Plan: #1: Right renal artery stenosis-the patient's ultrasound today albeit somewhat limited evaluation shows that his right kidney has improved blood flow following his stenting. He will need a followup ultrasound in 6 months. Hopefully at that time we can get a better visualization of his  stent. He will continue with his Aggrenox for antiplatelet therapy  #2 claudication: His symptoms are stable and will followup with Dr. Hart Rochester at the next scheduled appointment  V. Charlena Cross, M.D. [facility] [place] Office: [phone]

## 2011-02-15 ENCOUNTER — Other Ambulatory Visit: Payer: Self-pay | Admitting: Endocrinology

## 2011-03-07 NOTE — Procedures (Unsigned)
RENAL ARTERY DUPLEX EVALUATION  INDICATION:  Renal artery stenosis.  HISTORY: Diabetes:  Yes. Cardiac:  CAD. Hypertension:  Yes. Smoking:  Currently.  RENAL ARTERY DUPLEX FINDINGS: Aorta-Proximal:  70 cm/s Aorta-Mid:  47 cm/s Aorta-Distal: Celiac Artery Origin:  128 cm/s SMA Origin:  114 cm/s                                   RIGHT               LEFT Renal Artery Origin:             101 cm/s            129 cm/s Renal Artery Proximal:           79 cm/s             170 cm/s Renal Artery Mid:                84 cm/s             112 cm/s Renal Artery Distal:             39 cm/s             99 cm/s Hilar Acceleration Time (AT): Renal-Aortic Ratio (RAR):        1.44                2.43 Kidney Size:                     9.74 cm             11 cm End Diastolic Ratio (EDR): Resistive Index (RI):            0.88/0.59           0.75/0.68  IMPRESSION: 1. Patent bilateral renal arteries with renal aortic ratios less than     3.5 and stenosis estimated in the 1% to 59% range. 2. Right renal artery stent is not visualized due to bowel gas and     surgical intervention. 3. Aortobifemoral graft appears patent and without hemodynamic changes     present.  ___________________________________________ V. Charlena Cross, MD  SH/MEDQ  D:  02/07/2011  T:  02/07/2011  Job:  [id number]

## 2011-03-18 ENCOUNTER — Other Ambulatory Visit: Payer: Self-pay

## 2011-03-18 MED ORDER — INSULIN LISPRO PROT & LISPRO (75-25 MIX) 100 UNIT/ML ~~LOC~~ SUSP: SUBCUTANEOUS | Status: DC

## 2011-03-23 ENCOUNTER — Other Ambulatory Visit: Payer: Self-pay | Admitting: *Deleted

## 2011-03-23 MED ORDER — INSULIN LISPRO PROT & LISPRO (75-25 MIX) 100 UNIT/ML ~~LOC~~ SUSP: SUBCUTANEOUS | Status: DC

## 2011-03-23 NOTE — Telephone Encounter (Signed)
R'cd fax from [facility] for refill of Humalog Stephanie Coup  Last OV-01/2011

## 2011-03-30 ENCOUNTER — Telehealth: Payer: Self-pay | Admitting: *Deleted

## 2011-03-30 NOTE — Telephone Encounter (Signed)
A user error has taken place: encounter opened in error, closed for administrative reasons.

## 2011-04-21 ENCOUNTER — Other Ambulatory Visit: Payer: Self-pay | Admitting: Endocrinology

## 2011-04-22 ENCOUNTER — Other Ambulatory Visit: Payer: Self-pay | Admitting: Endocrinology

## 2011-05-03 ENCOUNTER — Ambulatory Visit (INDEPENDENT_AMBULATORY_CARE_PROVIDER_SITE_OTHER): Payer: BC Managed Care – PPO | Admitting: Endocrinology

## 2011-05-03 ENCOUNTER — Encounter: Payer: Self-pay | Admitting: Endocrinology

## 2011-05-03 ENCOUNTER — Other Ambulatory Visit (INDEPENDENT_AMBULATORY_CARE_PROVIDER_SITE_OTHER): Payer: Medicare Other

## 2011-05-03 VITALS — BP 102/62 | HR 67 | Temp 98.4°F | Ht 74.0 in | Wt 147.4 lb

## 2011-05-03 DIAGNOSIS — E1049 Type 1 diabetes mellitus with other diabetic neurological complication: Secondary | ICD-10-CM

## 2011-05-03 NOTE — Progress Notes (Signed)
Subjective:    Patient ID: [name], male    DOB: 1930-07-19, 78 y.o.   MRN: [id number]  HPI Pt returns for f/u of insulin-requiring DM (1996) (possibly due to pancreatic failure).  no cbg record, but states cbg's vary from 150-200.  It is in general higher as the day goes on Past Medical History  Diagnosis Date  . Hypertension   . Anemia   . PAD (peripheral artery disease) 10/10    Angiogram  Dr Myra Gianotti , severe stenosis left profunda after insertion of aorto-bifemoral  bypass graft-total occulsionof left SFA. Collateral flow through the profunda femoral artery.  Marland Kitchen CAD in native artery 2003    by cath   . Hypercholesteremia   . Thyroid disease   . Thyroiditis, unspecified   . Osteoarthritis of spine   . Olecranon bursitis   . 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 GERD (gastroesophageal reflux disease)   . Diabetes mellitus   . History of colonic polyps   . Iron deficiency anemia, unspecified   . Leg pain   . Numbness   . History of colonoscopy   . Chronic kidney disease     Past Surgical History  Procedure Date  . Appendectomy   . Aortobifemoral bypass 2002  . Electrocardiogram 01/12/2006  . Stress cardiolite 04/14/2003  . Pr vein bypass graft,aorto-fem-pop 2002  . Cholecystectomy   . Renal artery stent 11/16/2010    Right renal artery    History   Social History  . Marital Status: Married    Spouse Name: N/A    Number of Children: 2  . Years of Education: N/A   Occupational History  . Hydrologist     Retired   Social History Main Topics  . Smoking status: Current Everyday Smoker -- 1.0 packs/day for 60 years    Types: Cigarettes  . Smokeless tobacco: Not on file  . Alcohol Use: No  . Drug Use: No  . Sexually Active: Not on file   Other Topics Concern  . 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 AGGRENOX 25-200 MG per 12 hr capsule TAKE 1 CAPSULE TWICE A DAY  180 capsule  0  . atenolol (TENORMIN) 25 MG tablet Take 1 tablet (25 mg total) by mouth daily.  90 tablet  3  . calcitRIOL (ROCALTROL) 0.5 MCG capsule Take 0.5 mcg by mouth daily.        . hydrochlorothiazide (HYDRODIURIL) 25 MG tablet Take 1 tablet (25 mg total) by mouth daily.  30 tablet  11  . HYDROcodone-acetaminophen (NORCO) 10-325 MG per tablet Take 1 tablet by mouth every 4 (four) hours as needed.  50 tablet  3  . Insulin Pen Needle (B-D ULTRAFINE III SHORT PEN) 31G X 8 MM MISC by Does not apply route 2 (two) times daily.        . ONE TOUCH ULTRA TEST test strip USE AS DIRECTED  100 each  5  . sildenafil (VIAGRA) 100 MG tablet Take 100 mg by mouth as needed.       Marland Kitchen VYTORIN 10-40 MG per tablet TAKE 1 TABLET ONCE DAILY  90 tablet  0  . varenicline (CHANTIX) 0.5 MG tablet First month 0.5 mg.  Subsequent month with 1 mg please.  180 tablet  1  Allergies  Allergen Reactions  . Clopidogrel Bisulfate Hives    Family History  Problem Relation Age of Onset  . Kidney disease Sister   . Heart attack Father   . Coronary artery disease Father   . Cancer Neg Hx     BP 102/62  Pulse 67  Temp(Src) 98.4 F (36.9 C) (Oral)  Ht 6\' 2"  (1.88 m)  Wt 147 lb 6.4 oz (66.86 kg)  BMI 18.92 kg/m2  SpO2 95%    Review of Systems denies hypoglycemia.      Objective:   Physical Exam VITAL SIGNS:  See vs page GENERAL: no distress SKIN:  Insulin injection sites at the anterior abdomen are normal  Lab Results  Component Value Date   HGBA1C 8.3* 05/03/2011      Assessment & Plan:  DM, needs increased rx

## 2011-05-03 NOTE — Patient Instructions (Addendum)
blood tests are being ordered for you today.  please call [phone] to hear your test results.  You will be prompted to enter the 9-digit "MRN" number that appears at the top left of this page, followed by #.  Then you will hear the message. pending the test results, please increase insulin to 15 units with breakfast, and 9 with the evening meal.   Please come back for a "medicare wellness" appointment in 3 months.   good diet and exercise habits significanly improve the control of your diabetes.  please let me know if you wish to be referred to a dietician.  high blood sugar is very risky to your health.  you should see an eye doctor every year. controlling your blood pressure and cholesterol drastically reduces the damage diabetes does to your body.  this also applies to quitting smoking.  please discuss these with your doctor.  you should take an aspirin every day, unless you have been advised by a doctor not to. check your blood sugar 2 times a day.  vary the time of day when you check, between before the 3 meals, and at bedtime.  also check if you have symptoms of your blood sugar being too high or too low.  please keep a record of the readings and bring it to your next appointment here.  please call us sooner if your blood sugar goes below 70, or if it stays over 200.

## 2011-06-24 ENCOUNTER — Other Ambulatory Visit: Payer: Self-pay | Admitting: *Deleted

## 2011-06-24 MED ORDER — EZETIMIBE-SIMVASTATIN 10-40 MG PO TABS: ORAL_TABLET | ORAL | Status: DC

## 2011-06-24 NOTE — Telephone Encounter (Signed)
R'cd fax from [facility] for refill of Vytorin

## 2011-07-07 ENCOUNTER — Other Ambulatory Visit: Payer: Self-pay | Admitting: *Deleted

## 2011-07-07 MED ORDER — HYDROCODONE-ACETAMINOPHEN 10-325 MG PO TABS: 1.0000 | ORAL_TABLET | ORAL | Status: DC | PRN

## 2011-07-07 NOTE — Telephone Encounter (Signed)
Rx faxed to [facility] 

## 2011-07-07 NOTE — Telephone Encounter (Signed)
i printed 

## 2011-07-07 NOTE — Telephone Encounter (Signed)
R'cd fax from [facility] for refill of Hydrocodone-Acetaminophen 10/325 Last written 01/28/2011 #50 with 3 refills-please advise

## 2011-07-18 ENCOUNTER — Other Ambulatory Visit: Payer: Self-pay | Admitting: *Deleted

## 2011-07-18 MED ORDER — ASPIRIN-DIPYRIDAMOLE ER 25-200 MG PO CP12: ORAL_CAPSULE | ORAL | Status: DC

## 2011-07-18 NOTE — Telephone Encounter (Signed)
R'cd fax from [facility] for refill of Aggrenox

## 2011-07-28 ENCOUNTER — Encounter: Payer: Medicare Other | Admitting: Endocrinology

## 2011-08-04 ENCOUNTER — Ambulatory Visit (INDEPENDENT_AMBULATORY_CARE_PROVIDER_SITE_OTHER): Payer: Medicare Other | Admitting: Endocrinology

## 2011-08-04 ENCOUNTER — Encounter: Payer: Self-pay | Admitting: Endocrinology

## 2011-08-04 ENCOUNTER — Other Ambulatory Visit (INDEPENDENT_AMBULATORY_CARE_PROVIDER_SITE_OTHER): Payer: Medicare Other

## 2011-08-04 VITALS — BP 128/68 | HR 65 | Temp 97.5°F | Ht 74.0 in | Wt 146.0 lb

## 2011-08-04 DIAGNOSIS — E1142 Type 2 diabetes mellitus with diabetic polyneuropathy: Secondary | ICD-10-CM

## 2011-08-04 DIAGNOSIS — E1049 Type 1 diabetes mellitus with other diabetic neurological complication: Secondary | ICD-10-CM

## 2011-08-04 DIAGNOSIS — E1149 Type 2 diabetes mellitus with other diabetic neurological complication: Secondary | ICD-10-CM

## 2011-08-04 DIAGNOSIS — E069 Thyroiditis, unspecified: Secondary | ICD-10-CM

## 2011-08-04 DIAGNOSIS — K769 Liver disease, unspecified: Secondary | ICD-10-CM

## 2011-08-04 DIAGNOSIS — R209 Unspecified disturbances of skin sensation: Secondary | ICD-10-CM

## 2011-08-04 DIAGNOSIS — Z0000 Encounter for general adult medical examination without abnormal findings: Secondary | ICD-10-CM

## 2011-08-04 DIAGNOSIS — E7800 Pure hypercholesterolemia, unspecified: Secondary | ICD-10-CM

## 2011-08-04 DIAGNOSIS — D509 Iron deficiency anemia, unspecified: Secondary | ICD-10-CM

## 2011-08-04 DIAGNOSIS — Z125 Encounter for screening for malignant neoplasm of prostate: Secondary | ICD-10-CM

## 2011-08-04 LAB — CBC WITH DIFFERENTIAL/PLATELET
Basophils Relative: 1.1 % (ref 0.0–3.0)
Eosinophils Relative: 3.8 % (ref 0.0–5.0)
HCT: 39.6 % (ref 39.0–52.0)
Lymphs Abs: 1.2 10*3/uL (ref 0.7–4.0)
MCV: 88.2 fl (ref 78.0–100.0)
Monocytes Absolute: 0.6 10*3/uL (ref 0.1–1.0)
Neutrophils Relative %: 66.8 % (ref 43.0–77.0)
RBC: 4.49 Mil/uL (ref 4.22–5.81)
WBC: 6.5 10*3/uL (ref 4.5–10.5)

## 2011-08-04 LAB — LIPID PANEL
HDL: 45.8 mg/dL (ref >39.00)
Total CHOL/HDL Ratio: 2
Triglycerides: 56 mg/dL (ref 0.0–149.0)

## 2011-08-04 LAB — HEPATIC FUNCTION PANEL
AST: 49 U/L — ABNORMAL HIGH (ref 0–37)
Albumin: 4.1 g/dL (ref 3.5–5.2)
Alkaline Phosphatase: 79 U/L (ref 39–117)
Bilirubin, Direct: 0.1 mg/dL (ref 0.0–0.3)
Total Bilirubin: 0.7 mg/dL (ref 0.3–1.2)

## 2011-08-04 LAB — IBC PANEL: Transferrin: 226.2 mg/dL (ref 212.0–360.0)

## 2011-08-04 NOTE — Patient Instructions (Addendum)
blood tests are being requested for you today.  You will receive a letter with results. please consider these measures for your health:  minimize alcohol.  do not use tobacco products.  have a colonoscopy at least every 10 years from age 78.  keep firearms safely stored.  always use seat belts.  have working smoke alarms in your home.  see an eye doctor and dentist regularly.  never drive under the influence of alcohol or drugs (including prescription drugs).  please let me know what your wishes would be, if artificial life support measures should become necessary.  it is critically important to prevent falling down (keep floor areas well-lit, dry, and free of loose objects.  If you have a cane, walker, or wheelchair, you should use it, even for short trips around the house.  Also, try not to rush) Please come back for a follow-up appointment in 3 months good diet and exercise habits significanly improve the control of your diabetes.  please let me know if you wish to be referred to a dietician.  high blood sugar is very risky to your health.  you should see an eye doctor every year. controlling your blood pressure and cholesterol drastically reduces the damage diabetes does to your body.  this also applies to quitting smoking.  please discuss these with your doctor.  you should take an aspirin every day, unless you have been advised by a doctor not to. You should have a vaccine against shingles (a painful rash which results from the  chickenpox infection which most people had many years ago).  This vaccine reduces, but does not totally eliminate the risk of shingles.  Because this is a medicare part d benefit, you should get it at a pharmacy.

## 2011-08-04 NOTE — Progress Notes (Signed)
Subjective:    Patient ID: [name], male    DOB: 04/22/1931, 78 y.o.   MRN: [id number]  HPI The state of at least three ongoing medical problems is addressed today: Pt returns for f/u of insulin-requiring DM (1996) (possibly due to pancreatic failure).  no cbg record, but states cbg's are well-controlled.  denies hypoglycemia.  He has slight leg edema.  He has slight numbness of the hands.  Past Medical History  Diagnosis Date  . Hypertension   . Anemia   . PAD (peripheral artery disease) 10/10    Angiogram  Dr Myra Gianotti , severe stenosis left profunda after insertion of aorto-bifemoral  bypass graft-total occulsionof left SFA. Collateral flow through the profunda femoral artery.  Marland Kitchen CAD in native artery 2003    by cath   . Hypercholesteremia   . Thyroid disease   . Thyroiditis, unspecified   . Osteoarthritis of spine   . Olecranon bursitis   . 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 GERD (gastroesophageal reflux disease)   . Diabetes mellitus   . History of colonic polyps   . Iron deficiency anemia, unspecified   . Leg pain   . Numbness   . History of colonoscopy   . Chronic kidney disease     Past Surgical History  Procedure Date  . Appendectomy   . Aortobifemoral bypass 2002  . Electrocardiogram 01/12/2006  . Stress cardiolite 04/14/2003  . Pr vein bypass graft,aorto-fem-pop 2002  . Cholecystectomy   . Renal artery stent 11/16/2010    Right renal artery    History   Social History  . Marital Status: Married    Spouse Name: N/A    Number of Children: 2  . Years of Education: N/A   Occupational History  . Hydrologist     Retired   Social History Main Topics  . Smoking status: Current Everyday Smoker -- 1.0 packs/day for 60 years    Types: Cigarettes  . Smokeless tobacco: Not on file  . Alcohol Use: No  . Drug Use: No  . Sexually Active: Not on file   Other Topics Concern  . 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 atenolol (TENORMIN) 25 MG tablet Take 1 tablet (25 mg total) by mouth daily.  90 tablet  3  . calcitRIOL (ROCALTROL) 0.5 MCG capsule Take 0.5 mcg by mouth daily.        Marland Kitchen dipyridamole-aspirin (AGGRENOX) 25-200 MG per 12 hr capsule TAKE 1 CAPSULE TWICE A DAY  180 capsule  2  . ezetimibe-simvastatin (VYTORIN) 10-40 MG per tablet TAKE 1 TABLET ONCE DAILY  90 tablet  2  . hydrochlorothiazide (HYDRODIURIL) 25 MG tablet Take 1 tablet (25 mg total) by mouth daily.  30 tablet  11  . HYDROcodone-acetaminophen (NORCO) 10-325 MG per tablet Take 1 tablet by mouth every 4 (four) hours as needed.  50 tablet  3  . insulin lispro protamine-insulin lispro (HUMALOG 75/25) (75-25) 100 UNIT/ML SUSP Inject into the skin 2 (two) times daily with a meal, 16 units with breakfast, and 10 units with the evening meal.      . Insulin Pen Needle (B-D ULTRAFINE III SHORT PEN) 31G X 8 MM MISC by Does not apply route 2 (two) times daily.        Marland Kitchen  ONE TOUCH ULTRA TEST test strip USE AS DIRECTED  100 each  5  . sildenafil (VIAGRA) 100 MG tablet Take 100 mg by mouth as needed.       . Vitamin D, Ergocalciferol, (DRISDOL) 50000 UNITS CAPS Take 50,000 Units by mouth 2 (two) times a week.        . varenicline (CHANTIX) 0.5 MG tablet First month 0.5 mg.  Subsequent month with 1 mg please.  180 tablet  1    Allergies  Allergen Reactions  . Clopidogrel Bisulfate Hives    Family History  Problem Relation Age of Onset  . Kidney disease Sister   . Heart attack Father   . Coronary artery disease Father   . Cancer Neg Hx     BP 128/68  Pulse 65  Temp(Src) 97.5 F (36.4 C) (Oral)  Ht 6\' 2"  (1.88 m)  Wt 146 lb (66.225 kg)  BMI 18.75 kg/m2  SpO2 99%    Review of Systems He is having trouble regaining the weight he lost in the hospital.   Denies chest pain.    Objective:   Physical Exam VITAL SIGNS:  See vs page.  GENERAL: no distress. NECK: There is no palpable thyroid enlargement.  No thyroid nodule is palpable.  No palpable lymphadenopathy at the anterior neck. LUNGS:  Clear to auscultation. HEART:  Regular rate and rhythm without murmurs noted. Normal S1,S2.      Lab Results  Component Value Date   WBC 6.5 08/04/2011   HGB 12.7* 08/04/2011   HCT 39.6 08/04/2011   PLT 134.0* 08/04/2011   GLUCOSE 192* 11/16/2010   CHOL 100 08/04/2011   TRIG 56.0 08/04/2011   HDL 45.80 08/04/2011   LDLCALC 43 08/04/2011   ALT 54* 08/04/2011   AST 49* 08/04/2011   NA 140 11/16/2010   K 4.8 11/16/2010   CL 109 11/16/2010   CREATININE 1.70* 11/16/2010   BUN 31* 11/16/2010   CO2 24 07/28/2010   TSH 0.57 08/04/2011   PSA 1.51 08/04/2011   INR 1.30 02/19/2009   HGBA1C 8.3* 08/04/2011   MICROALBUR 9.2* 06/29/2010      Assessment & Plan:  Anemia, recurrent, mild.  prob due to renal insuff DM, needs increased rx. elev hepatic transaminases, persistent. Thrombocytopenia, recurrent, mild. Renal insuff, unchanged  Subjective:   Patient here for Medicare annual wellness visit and management of other chronic and acute problems.     Risk factors: advanced age    Roster of Physicians Providing Medical Care to Patient:  See "snapshot"   Activities of Daily Living: In your present state of health, do you have any difficulty performing the following activities?:  Preparing food and eating?: No  Bathing yourself: No  Getting dressed: No  Using the toilet:No  Moving around from place to place: No  In the past year have you fallen or had a near fall?:No    Home Safety: Has smoke detector and wears seat belts. No firearms.   Diet and Exercise  Current exercise habits: pt says limited by med probs Dietary issues discussed: pt reports a healthy diet   Depression Screen  Q1: Over the past two weeks, have you felt down, depressed or hopeless? no    Q2: Over the past two weeks, have you felt little interest or pleasure in doing things? no   The following portions of the patient's history were reviewed and updated as appropriate: allergies, current medications, past family history, past medical history, past social history, past surgical  history and problem list.  Past Medical History  Diagnosis Date  . Hypertension   . Anemia   . PAD (peripheral artery disease) 10/10    Angiogram  Dr Myra Gianotti , severe stenosis left profunda after insertion of aorto-bifemoral  bypass graft-total occulsionof left SFA. Collateral flow through the profunda femoral artery.  Marland Kitchen CAD in native artery 2003    by cath   . Hypercholesteremia   . Thyroid disease   . Thyroiditis, unspecified   . Osteoarthritis of spine   . Olecranon bursitis   . 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 GERD (gastroesophageal reflux disease)   . Diabetes mellitus   . History of colonic polyps   . Iron deficiency anemia, unspecified   . Leg pain   . Numbness   . History of colonoscopy   . Chronic kidney disease     Past Surgical History  Procedure Date  . Appendectomy   . Aortobifemoral bypass 2002  . Electrocardiogram 01/12/2006  . Stress cardiolite 04/14/2003  . Pr vein bypass graft,aorto-fem-pop 2002  . Cholecystectomy   . Renal artery stent 11/16/2010    Right renal artery    History   Social History  . Marital Status: Married    Spouse Name: N/A    Number of Children: 2  . Years of Education: N/A   Occupational History  . Hydrologist     Retired   Social History Main Topics  . Smoking status: Current Everyday Smoker -- 1.0 packs/day for 60 years    Types: Cigarettes  . Smokeless tobacco: Not on file  . Alcohol Use: No  . Drug Use: No  . Sexually Active: Not on file   Other Topics Concern  . 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 atenolol (TENORMIN) 25 MG tablet Take 1 tablet (25 mg total) by mouth daily.  90 tablet  3  . calcitRIOL (ROCALTROL) 0.5 MCG capsule Take 0.5 mcg by mouth daily.        Marland Kitchen dipyridamole-aspirin (AGGRENOX) 25-200 MG per 12 hr capsule TAKE 1 CAPSULE TWICE A DAY  180 capsule  2  . ezetimibe-simvastatin (VYTORIN) 10-40 MG per tablet TAKE 1 TABLET ONCE DAILY  90 tablet  2  . hydrochlorothiazide (HYDRODIURIL) 25 MG tablet Take 1 tablet (25 mg total) by mouth daily.  30 tablet  11  . HYDROcodone-acetaminophen (NORCO) 10-325 MG per tablet Take 1 tablet by mouth every 4 (four) hours as needed.  50 tablet  3  . insulin lispro protamine-insulin lispro (HUMALOG 75/25) (75-25) 100 UNIT/ML SUSP Inject into the skin 2 (two) times daily with a meal, 15 units with breakfast, and 9 units with the evening meal.       . Insulin Pen Needle (B-D ULTRAFINE III SHORT PEN) 31G X 8 MM MISC by Does not apply route 2 (two) times daily.        . ONE TOUCH ULTRA TEST test strip USE AS DIRECTED  100 each  5  . sildenafil (VIAGRA) 100 MG tablet Take 100 mg by mouth as needed.       . Vitamin D, Ergocalciferol, (DRISDOL) 50000 UNITS CAPS Take 50,000 Units by mouth 2 (two) times a week.        . varenicline (CHANTIX) 0.5 MG tablet First month 0.5  mg.  Subsequent month with 1 mg please.  180 tablet  1    Allergies  Allergen Reactions  . Clopidogrel Bisulfate Hives    Family History  Problem Relation Age of Onset  . Kidney disease Sister   . Heart attack Father   . Coronary artery disease Father   . Cancer Neg Hx     BP 128/68  Pulse 65  Temp(Src) 97.5 F (36.4 C) (Oral)  Ht 6\' 2"  (1.88 m)  Wt 146 lb (66.225 kg)  BMI 18.75 kg/m2  SpO2 99%   Review of Systems  Denies hearing loss, and visual loss Objective:   Vision:  Sees opthalmologist Hearing: grossly normal Body mass index:  See vs page Msk: pt  easily and quickly performs "get-up-and-go" from a sitting position Cognitive Impairment Assessment: cognition, memory and judgment appear normal.  remembers 3/3 at 5 minutes.  excellent recall.  can easily read and write a sentence.  alert and oriented x 3   Assessment:   Medicare wellness utd on preventive parameters    Plan:   During the course of the visit the patient was educated and counseled about appropriate screening and preventive services including:        Fall prevention   Diabetes screening  Nutrition counseling   Vaccines / LABS Zostavax / Pnemonccoal Vaccine  today  PSA  Patient Instructions (the written plan) was given to the patient.

## 2011-08-06 ENCOUNTER — Encounter: Payer: Self-pay | Admitting: Endocrinology

## 2011-08-08 ENCOUNTER — Other Ambulatory Visit (INDEPENDENT_AMBULATORY_CARE_PROVIDER_SITE_OTHER): Payer: Medicare Other | Admitting: *Deleted

## 2011-08-08 DIAGNOSIS — Z48812 Encounter for surgical aftercare following surgery on the circulatory system: Secondary | ICD-10-CM

## 2011-08-08 DIAGNOSIS — I701 Atherosclerosis of renal artery: Secondary | ICD-10-CM

## 2011-08-09 ENCOUNTER — Telehealth: Payer: Self-pay | Admitting: *Deleted

## 2011-08-09 NOTE — Telephone Encounter (Signed)
Called pt to inform of lab results, pt informed. (Letter also mailed to pt) 

## 2011-08-29 ENCOUNTER — Encounter: Payer: Self-pay | Admitting: Surgery

## 2011-08-29 ENCOUNTER — Other Ambulatory Visit: Payer: Self-pay | Admitting: *Deleted

## 2011-08-29 DIAGNOSIS — I701 Atherosclerosis of renal artery: Secondary | ICD-10-CM

## 2011-08-29 DIAGNOSIS — Z48812 Encounter for surgical aftercare following surgery on the circulatory system: Secondary | ICD-10-CM

## 2011-08-29 NOTE — Procedures (Unsigned)
RENAL ARTERY DUPLEX EVALUATION  INDICATION:  Followup right renal artery stent  HISTORY: Aortobifemoral bypass graft 2002; history of bilateral SFA stenosis Diabetes:  Y Cardiac:  CAD, CABG Hypertension:  Yes Smoking:  Yes  RENAL ARTERY DUPLEX FINDINGS: Aorta-Proximal:  50 cm/s Aorta-Mid:  43 cm/s Aorta-Distal:  103 cm/s Celiac Artery Origin:  100 cm/s SMA Origin:  165 cm/s                                   RIGHT               LEFT Renal Artery Origin:             96 cm/s             133 cm/s Renal Artery Proximal:           109 cm/s            207 cm/s Renal Artery Mid:                52 cm/s             198 cm/s Renal Artery Distal:             58 cm/s             303 cm/s Hilar Acceleration Time (AT): Renal-Aortic Ratio (RAR):        1.16                6.06 Kidney Size:                     9.3 cm              11.4 cm End Diastolic Ratio (EDR): Resistive Index (RI):  IMPRESSION: 1. Right renal artery stent not visualized, however, low velocities     and dampened waveforms are noted. 2. Increased velocities visualized in the mid to distal renal artery     which may be partially due to slight curve. 3. Note:  Known solid left renal mass per CT of the abdomen at     [facility] 06/02/2011.  ___________________________________________ V. Charlena Cross, MD  SS/MEDQ  D:  08/08/2011  T:  08/08/2011  Job:  [id number]

## 2011-10-11 ENCOUNTER — Other Ambulatory Visit: Payer: Self-pay | Admitting: Endocrinology

## 2011-10-11 MED ORDER — INSULIN LISPRO PROT & LISPRO (75-25 MIX) 100 UNIT/ML ~~LOC~~ SUSP: SUBCUTANEOUS | Status: DC

## 2011-11-03 ENCOUNTER — Other Ambulatory Visit (INDEPENDENT_AMBULATORY_CARE_PROVIDER_SITE_OTHER): Payer: Medicare Other

## 2011-11-03 ENCOUNTER — Encounter: Payer: Self-pay | Admitting: Endocrinology

## 2011-11-03 ENCOUNTER — Ambulatory Visit (INDEPENDENT_AMBULATORY_CARE_PROVIDER_SITE_OTHER): Payer: Medicare Other | Admitting: Endocrinology

## 2011-11-03 VITALS — BP 132/68 | HR 77 | Temp 97.8°F | Wt 147.1 lb

## 2011-11-03 DIAGNOSIS — E1142 Type 2 diabetes mellitus with diabetic polyneuropathy: Secondary | ICD-10-CM

## 2011-11-03 DIAGNOSIS — E1149 Type 2 diabetes mellitus with other diabetic neurological complication: Secondary | ICD-10-CM

## 2011-11-03 DIAGNOSIS — E119 Type 2 diabetes mellitus without complications: Secondary | ICD-10-CM

## 2011-11-03 DIAGNOSIS — E1049 Type 1 diabetes mellitus with other diabetic neurological complication: Secondary | ICD-10-CM

## 2011-11-03 MED ORDER — HYDROCODONE-ACETAMINOPHEN 10-325 MG PO TABS: 1.0000 | ORAL_TABLET | ORAL | Status: DC | PRN

## 2011-11-03 NOTE — Patient Instructions (Addendum)
good diet and exercise habits significanly improve the control of your diabetes.  please let me know if you wish to be referred to a dietician.  high blood sugar is very risky to your health.  you should see an eye doctor every year. controlling your blood pressure and cholesterol drastically reduces the damage diabetes does to your body.  this also applies to quitting smoking.  please discuss these with your doctor.  you should take an aspirin every day, unless you have been advised by a doctor not to. check your blood sugar twice a day.  vary the time of day when you check, between before the 3 meals, and at bedtime.  also check if you have symptoms of your blood sugar being too high or too low.  please keep a record of the readings and bring it to your next appointment here.  please call us sooner if your blood sugar goes below 70, or if it stays over 200.  Please come back for a follow-up appointment in 3 months blood tests are being requested for you today.  You will receive a letter with results.

## 2011-11-03 NOTE — Progress Notes (Signed)
Subjective:    Patient ID: [name], male    DOB: 1930-06-06, 78 y.o.   MRN: [id number]  HPI Pt returns for f/u of insulin-requiring DM (dx'ed 1996, possibly due to pancreatic failure; complicated by CAD, peripheral sensory neuropathy, renal insufficiency, and PAD).  no cbg record, but states cbg's are well-controlled Past Medical History  Diagnosis Date  . Hypertension   . Anemia   . PAD (peripheral artery disease) 10/10    Angiogram  Dr Myra Gianotti , severe stenosis left profunda after insertion of aorto-bifemoral  bypass graft-total occulsionof left SFA. Collateral flow through the profunda femoral artery.  Marland Kitchen CAD in native artery 2003    by cath   . Hypercholesteremia   . Thyroid disease   . Thyroiditis, unspecified   . Osteoarthritis of spine   . Olecranon bursitis   . 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 GERD (gastroesophageal reflux disease)   . Diabetes mellitus   . History of colonic polyps   . Iron deficiency anemia, unspecified   . Leg pain   . Numbness   . History of colonoscopy   . Chronic kidney disease     Past Surgical History  Procedure Date  . Appendectomy   . Aortobifemoral bypass 2002  . Electrocardiogram 01/12/2006  . Stress cardiolite 04/14/2003  . Pr vein bypass graft,aorto-fem-pop 2002  . Cholecystectomy   . Renal artery stent 11/16/2010    Right renal artery    History   Social History  . Marital Status: Married    Spouse Name: N/A    Number of Children: 2  . Years of Education: N/A   Occupational History  . Hydrologist     Retired   Social History Main Topics  . Smoking status: Current Everyday Smoker -- 1.0 packs/day for 60 years    Types: Cigarettes  . Smokeless tobacco: Not on file  . Alcohol Use: No  . Drug Use: No  . Sexually Active: Not on file   Other Topics Concern  . 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 atenolol (TENORMIN) 25 MG tablet Take 1 tablet (25 mg total) by mouth daily.  90 tablet  3  . calcitRIOL (ROCALTROL) 0.5 MCG capsule Take 0.5 mcg by mouth daily.        Marland Kitchen dipyridamole-aspirin (AGGRENOX) 25-200 MG per 12 hr capsule TAKE 1 CAPSULE TWICE A DAY  180 capsule  2  . ezetimibe-simvastatin (VYTORIN) 10-40 MG per tablet TAKE 1 TABLET ONCE DAILY  90 tablet  2  . hydrochlorothiazide (HYDRODIURIL) 25 MG tablet Take 1 tablet (25 mg total) by mouth daily.  30 tablet  11  . HYDROcodone-acetaminophen (NORCO) 10-325 MG per tablet Take 1 tablet by mouth every 4 (four) hours as needed.  50 tablet  3  . insulin lispro protamine-insulin lispro (HUMALOG 75/25) (75-25) 100 UNIT/ML SUSP Inject into the skin 2 (two) times daily with a meal, 16 units with breakfast, and 10 units with the evening meal.  45 mL  3  . Insulin Pen Needle (B-D ULTRAFINE III SHORT PEN) 31G X 8 MM MISC by Does not apply route 2 (two) times daily.        . ONE TOUCH ULTRA TEST test strip USE AS DIRECTED  100 each  5  . sildenafil (  VIAGRA) 100 MG tablet Take 100 mg by mouth as needed.         Allergies  Allergen Reactions  . Clopidogrel Bisulfate Hives    Family History  Problem Relation Age of Onset  . Kidney disease Sister   . Heart attack Father   . Coronary artery disease Father   . Cancer Neg Hx     BP 132/68  Pulse 77  Temp 97.8 F (36.6 C) (Oral)  Wt 147 lb 1.9 oz (66.733 kg)  SpO2 98%    Review of Systems denies hypoglycemia    Objective:   Physical Exam VITAL SIGNS:  See vs page GENERAL: no distress Pulses: dorsalis pedis intact bilat.   Feet: no deformity.  no ulcer on the feet.  feet are of normal color and temp.  no edema Neuro: sensation is intact to touch on the feet, but decreased from normal    Lab Results  Component Value Date   HGBA1C 7.6* 11/03/2011      Assessment &  Plan:  DM.  this is the best control this pt should aim for, given this regimen, which does match insulin to her changing needs throughout the day

## 2011-11-04 ENCOUNTER — Telehealth: Payer: Self-pay | Admitting: *Deleted

## 2011-11-04 NOTE — Telephone Encounter (Signed)
Called pt to inform of lab results, pt informed via VM and to callback office with any questions/concerns (letter also mailed to pt).  

## 2011-11-28 ENCOUNTER — Other Ambulatory Visit: Payer: Self-pay | Admitting: Cardiovascular Disease

## 2011-11-29 NOTE — Telephone Encounter (Signed)
Pt needs appointment then refill can be made Fax Received. Refill Completed. [name] (R.M.A)   

## 2011-12-07 ENCOUNTER — Other Ambulatory Visit: Payer: Self-pay

## 2011-12-07 MED ORDER — EZETIMIBE-SIMVASTATIN 10-40 MG PO TABS: ORAL_TABLET | ORAL | Status: DC

## 2011-12-15 ENCOUNTER — Other Ambulatory Visit: Payer: Self-pay | Admitting: Urology

## 2011-12-15 DIAGNOSIS — N2889 Other specified disorders of kidney and ureter: Secondary | ICD-10-CM

## 2011-12-28 ENCOUNTER — Ambulatory Visit
Admission: RE | Admit: 2011-12-28 | Discharge: 2011-12-28 | Disposition: A | Discharge status: Home / Self Care | Payer: Medicare Other | Source: Ambulatory Visit | Attending: Urology | Admitting: Urology

## 2011-12-28 ENCOUNTER — Telehealth: Payer: Self-pay | Admitting: *Deleted

## 2011-12-28 DIAGNOSIS — N2889 Other specified disorders of kidney and ureter: Secondary | ICD-10-CM

## 2011-12-28 NOTE — Telephone Encounter (Signed)
Received call from Tammy at [facility] requesting surgical clearance for pt. He is to have Left renal cryoablation and biopsy by Dr Fredia Sorrow for Left renal mass. This is planned for October. He last saw Dr. Clifton James in May 2012. I told Tammy that pt would need office visit with Dr. Clifton James prior to being cleared and I would contact him to make this appt. I spoke with pt and appt made for him to see Dr. Clifton James on January 05, 2012 at 10:15.  Phone number for [facility] is [phone] and fax is [phone]. I spoke with Tammy and informed her of pt's appt time.

## 2011-12-29 NOTE — Progress Notes (Signed)
Patient ID: [name], male   DOB: Jul 25, 1930, 78 y.o.   MRN: [id number]  NEW PATIENT OFFICE VISIT  December 28, 2011  Steve Field, MD [facility] [address] [place], [place] [address]  Re: [name] (DOB: 12/25/2030)  Dear [name]:  Thank you for sending [name] for consultation regarding the use of possible percutaneous ablation to treat a left renal mass. The patient is a 78 year old male with history of chronic kidney disease who is followed by Dr. Camille Conrad. Initial detection of a mass emanating from the left kidney by ultrasound was on 09/07/10. At that time, ultrasound showed a roughly 2.9 cm solid appearing mass emanating from the medial left upper kidney. Nuclear medicine renogram on 10/12/10 demonstrated decreased flow and function of the right kidney and the patient had had prior arteriographic assessment for lower extremity arterial occlusive disease demonstrating evidence of a significant right renal artery stenosis. This was treated by Dr. Durene Conrad on 11/16/10 with placement of a stent to treat a critical proximal right renal artery stenosis. CT on 11/05/10 demonstrated an enhancing solid mass emanating from the cortex of the medial upper left kidney measuring approximately 2.7 cm in greatest diameter. This shows slight increased size by CT on 06/02/11 with maximal diameter of approximately 2.8 cm. More recent ultrasound surveillance on 12/13/11 demonstrated maximal tumor diameter of greater than 3 cm and up to potentially 3.5 cm.  The patient is asymptomatic with respect to the left renal mass. He does not have a history of malignancy or renal surgery.  Past Medical History: 1. History of prior aortobifemoral bypass grafting by Dr. Jerilee Conrad in 2002. The patient has known lower extremity peripheral vascular disease followed by both Dr. Hart Conrad and Dr. Myra Conrad with known left SFA occlusion and tibial occlusions  with chronic claudication symptoms. He has no history of lower extremity rest pain, ulcers or gangrene. 2. Chronic kidney disease and chronic renal insufficiency. The patient is followed by Dr. Camille [facility] at [name]. The most recent renal function I have available is from 07/12/11 with creatinine of 1.67 and estimated GFR of 45 ml/minute. Creatinine had been as high as the 1.9 range prior to right renal artery stent placement. 1. Page 2 Re: [name] (DOB: 12/25/2030)  3. History of insulin dependent diabetes. The patient is followed by Dr. Romero Conrad. 4. History of moderate coronary artery disease which is being medically managed by Dr. Earney Conrad at [name]. He has not had any prior coronary interventions. 5. History of hypertension and hypercholesterolemia. 6. History of gastroesophageal reflux. 7. History of thyroid disease. 8. History of glaucoma. 9. History of osteoarthritis.  Medications: Aggrenox one capsule daily, atenolol 25 mg daily, Vytorin one tablet daily, Humalog insulin 15 units at breakfast and 9 units in the evening, Vicodin as needed, hydrochlorothiazide 25 mg daily.  Allergies: Plavix previously caused significant itching.  Social History: Patient is married and lives in [place]. He has two children. He smokes half a pack of cigarettes per day. He denies alcohol use.  Family History: Father with history of myocardial infarction.  Review of Systems: General: 6'2" in height, 155 lbs in weight. Gastrointestinal: No abdominal pain, nausea, vomiting or diarrhea. Genitourinary: No hematuria, dysuria, increased urinary frequency or difficulty urinating. Cardiovascular: No chest pain or palpitations. Musculoskeletal: Chronic low back pain. Difficulty walking. Neurologic: Difficulty walking. Respiratory: No cough or shortness of breath.  Exam: Vital signs: Blood pressure 155/84, pulse 77, respirations 16,  temperature 97.1. Oxygen saturation  100% on room air. General: Alert and oriented. No acute distress. Chest: Clear to auscultation bilaterally. Heart: Regular rate and rhythm. Abdomen: Soft and nontender. No abdominal distention. No flank tenderness. Extremities: No lower extremity edema.  Labs: See renal function above. WBC 6.5, hemoglobin 12.7, hematocrit 39.6 and platelet count 134 in April.  Imaging: I personally reviewed prior imaging of the kidneys. Based on prior CT studies, the enhancing left renal lesion shows avid enhancement and is likely a renal cell carcinoma. This does show increase in size over the last year.  Assessment: I met with [name] and his son. We discussed treatment options for the left renal tumor, including surgical options and percutaneous ablation. As you have discussed with him, the patient is at higher surgical risk given multiple comorbidities and his history of chronic kidney disease. Due to renal insufficiency, a maximally nephron-sparing procedure is indicated.  The lesion is in a location and of a size amenable to percutaneous cryoablation. Details of cryoablation were discussed with the patient including technical details and risks. The lesion Page 3 Re: [name] (DOB: 2030-11-20)  is peripheral in location and should be treatable with minimal ablation of adjacent normal renal parenchyma.  After discussion, [name] would like to proceed with scheduling elective ablation of the left renal tumor. Biopsy can be performed at the time of ablation to establish a tissue diagnosis. I told Mr. [name] that this is almost definitely a renal carcinoma based on imaging appearance. Given recent imaging evidence of tumor enlargement, the patient would like to proceed with ablation at this time. We will begin the scheduling process. Based on current availability of Anesthesia for ablation, I told Mr. [name] we could likely treat him toward the end of October. The procedure will  be performed at [facility] and will involve overnight observation.  Thank you again for sending Mr. [name] for consultation and allowing me to participate in his care. I will keep you updated with plans for ablation.  Sincerely,    Irish Lack, MD Dortha Schwalbe 12/28/11  C: Steve Cal, MD Steve Field, MD Steve Belling, MD Steve Hamburg, MD

## 2012-01-03 ENCOUNTER — Other Ambulatory Visit: Payer: Self-pay | Admitting: Endocrinology

## 2012-01-05 ENCOUNTER — Telehealth: Payer: Self-pay | Admitting: Emergency Medicine

## 2012-01-05 ENCOUNTER — Encounter: Payer: Self-pay | Admitting: Cardiovascular Disease

## 2012-01-05 ENCOUNTER — Ambulatory Visit (INDEPENDENT_AMBULATORY_CARE_PROVIDER_SITE_OTHER): Payer: Medicare Other | Admitting: Cardiovascular Disease

## 2012-01-05 VITALS — BP 106/64 | HR 74 | Ht 74.0 in | Wt 145.0 lb

## 2012-01-05 DIAGNOSIS — I25810 Atherosclerosis of coronary artery bypass graft(s) without angina pectoris: Secondary | ICD-10-CM

## 2012-01-05 DIAGNOSIS — Z01810 Encounter for preprocedural cardiovascular examination: Secondary | ICD-10-CM

## 2012-01-05 NOTE — Patient Instructions (Signed)
Your physician recommends that you schedule a follow-up appointment in: 2-3 weeks.   Your physician has requested that you have a lexiscan myoview. For further information please visit [email or web address]. Please follow instruction sheet, as given.   Your physician has requested that you have an echocardiogram. Echocardiography is a painless test that uses sound waves to create images of your heart. It provides your doctor with information about the size and shape of your heart and how well your heart's chambers and valves are working. This procedure takes approximately one hour. There are no restrictions for this procedure.    

## 2012-01-05 NOTE — Progress Notes (Signed)
History of Present Illness: 78 yo AAM with h/o PVD, CAD, HTN, hyperlipidemia, DM here for followup. His last cardiac cath was in October of 2003 and showed moderate disease. No coronary interventions were performed.  He has a previous aortobifemoral bypass performed by Dr. Hart Rochester in 2002 with endarterectomy of the distal aorta as well as the right common femoral and distal external iliac artery. He was seen as a new patient in my office for evaluation of bilateral lower extremity fatigue in August of 2010. He told me at the initial visit that his legs both felt tired. There was some pain in the left leg with walking.  He continued to smoke one pack per day. He had not kept appointments with Dr. Hart Rochester over the prior 5 years. His non-invasive imaging in May 2010 demonstrated high grade stenosis of the left profunda with heavy calcification in both SFA, decreased TBI bilaterally. We sent him to see Dr. Hart Rochester and an angiogram was arranged. Dr. Myra Gianotti performed the angiogram on October 14th, 2010. He was found to have patent aorto-bifem bypass graft with total occlusion of the left SFA at the ostium and high grade disease in the ostium of the left profunda femoral artery. There was reconstitution at the popliteal with likely high grade disease in the tibial vessels. His PV disease has been followed in [facility]. I last saw him in May 2012. Since then, he underwent right renal artery stenting for severe right renal artery stenosis per Dr. Myra Gianotti.   He has been found to have a left renal mass and has plans for elective ablation of left renal tumor per Dr. Fredia Sorrow with IR. He is here today for cardiac follow up and pre-procedure risk assessment.  He is feeling well overall. No chest pain, SOB, palpitations, near syncope or syncope. His legs are not painful with walking. He does describe constant fatigue in the legs. His left foot is cold all of the time. He has not noticed any skin changes or ulcerations. He continues  to smoke cigarettes.   Primary Care Physician: Romero Belling  Last Lipid Profile:Lipid Panel     Component Value Date/Time   CHOL 100 08/04/2011 1015   TRIG 56.0 08/04/2011 1015   HDL 45.80 08/04/2011 1015   CHOLHDL 2 08/04/2011 1015   VLDL 11.2 08/04/2011 1015   LDLCALC 43 08/04/2011 1015     Past Medical History  Diagnosis Date  . Hypertension   . Anemia   . PAD (peripheral artery disease) 10/10    Angiogram  Dr Myra Gianotti , severe stenosis left profunda after insertion of aorto-bifemoral  bypass graft-total occulsionof left SFA. Collateral flow through the profunda femoral artery.  Marland Kitchen CAD in native artery 2003    by cath   . Hypercholesteremia   . Thyroid disease   . Thyroiditis, unspecified   . Osteoarthritis of spine   . Olecranon bursitis   . 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 GERD (gastroesophageal reflux disease)   . Diabetes mellitus   . History of colonic polyps   . Iron deficiency anemia, unspecified   . Leg pain   . Numbness   . History of colonoscopy   . Chronic kidney disease     Past Surgical History  Procedure Date  . Appendectomy   . Aortobifemoral bypass 2002  . Electrocardiogram 01/12/2006  . Stress cardiolite 04/14/2003  . Pr vein bypass graft,aorto-fem-pop 2002  . Cholecystectomy   . Renal artery stent 11/16/2010  Right renal artery    Current Outpatient Prescriptions  Medication Sig Dispense Refill  . atenolol (TENORMIN) 25 MG tablet TAKE 1 TABLET DAILY  90 tablet  2  . calcitRIOL (ROCALTROL) 0.5 MCG capsule Take 0.5 mcg by mouth daily.        Marland Kitchen dipyridamole-aspirin (AGGRENOX) 25-200 MG per 12 hr capsule TAKE 1 CAPSULE TWICE A DAY  180 capsule  2  . ezetimibe-simvastatin (VYTORIN) 10-40 MG per tablet TAKE 1 TABLET ONCE DAILY  90 tablet  1  . HYDROcodone-acetaminophen (NORCO) 10-325 MG per tablet Take 1 tablet by mouth every 4 (four) hours as needed for pain.  50 tablet  3  . insulin lispro protamine-insulin lispro  (HUMALOG 75/25) (75-25) 100 UNIT/ML SUSP Inject into the skin 2 (two) times daily with a meal, 16 units with breakfast, and 10 units with the evening meal.  45 mL  3  . Insulin Pen Needle (B-D ULTRAFINE III SHORT PEN) 31G X 8 MM MISC by Does not apply route 2 (two) times daily.        . ONE TOUCH ULTRA TEST test strip USE AS DIRECTED  100 each  5  . sildenafil (VIAGRA) 100 MG tablet Take 100 mg by mouth as needed.       . Vitamin D, Ergocalciferol, (DRISDOL) 50000 UNITS CAPS Take 50,000 Units by mouth. Taking Twice a Week        Allergies  Allergen Reactions  . Clopidogrel Bisulfate Hives    History   Social History  . Marital Status: Married    Spouse Name: N/A    Number of Children: 2  . Years of Education: N/A   Occupational History  . Hydrologist     Retired   Social History Main Topics  . Smoking status: Current Every Day Smoker -- 1.0 packs/day for 60 years    Types: Cigarettes  . Smokeless tobacco: Not on file  . Alcohol Use: No  . Drug Use: No  . Sexually Active: Not on file   Other Topics Concern  . 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 Kidney disease Sister   . Heart attack Father   . Coronary artery disease Father   . Cancer Neg Hx     Review of Systems:  As stated in the HPI and otherwise negative.   BP 106/64  Pulse 74  Ht 6\' 2"  (1.88 m)  Wt 145 lb (65.772 kg)  BMI 18.62 kg/m2  Physical Examination: General: Well developed, well nourished, NAD HEENT: OP clear, mucus membranes moist SKIN: warm, dry. No rashes. Neuro: No focal deficits Musculoskeletal: Muscle strength 5/5 all ext Psychiatric: Mood and affect normal Neck: No JVD, no carotid bruits, no thyromegaly, no lymphadenopathy. Lungs:Clear bilaterally, no wheezes, rhonci, crackles Cardiovascular: Regular rate and rhythm. No murmurs,  gallops or rubs. Abdomen:Soft. Bowel sounds present. Non-tender.  Extremities: No lower extremity edema. Pulses are 2 + in the bilateral DP/PT.  EKG: RBBB, Poor R wave progression. Rate 74 bpm.   Cardiac Cath 01/2002  1. Left main trunk: Large caliber vessel with mild tapering of 10% in the  proximal segment in the area of heavy calcification.  2. LAD: This is a large caliber vessel that provides a first diagonal  branch in the proximal segment and two trivial diagonal branches  distally. The LAD has a tubular  narrowing of 50% in the proximal  segment. There is mild diffuse disease of 30% in the mid and distal  sections. There is a high-grade tubular narrowing of 70-80% in the  apical LAD. The first diagonal branch has an ostial narrowing of 50%.  3. Left circumflex artery: This AV circumflex again is a large caliber  vessel and provides a major first marginal branch in the proximal segment  and two small marginal branches distally. The AV circumflex has moderate  narrowing of 50% after the first marginal branch. The first marginal  branch has a moderate narrowing of 50% in the proximal segment. Two  distal marginal branches have mild disease of 30%.  4. Right coronary artery is dominant. This is a small caliber vessel that  provides the posterior descending artery and three posterior ventricular  branches in its terminal segment. The right coronary artery is 100%  occluded proximally. It fills via right to right collaterals in the mid  and distal sections and there is a long tubular narrowing of 60% in the  distal RCA. The posterior descending artery is patent, although it  appears to have proximal disease of 70%. There are three posterior  ventricular branches that have mild disease.  Assessment and Plan:   1. CAD: He is known to have moderately severe CAD with totally occluded RCA and moderate disease in LAD and Circumflex by cath in 2003. He has had no recent stress testing or  repeat caths. He has no chest pain but is very inactive. He has a planned procedure that may require anesthesia per patient. Will arrange Lexiscan myoview to exclude ischemia. Will get echo to assess LVEF and valves. I will see him back in 2 weeks to review. Continue beta blocker, statin, ASA.   2. Pre-operative risk assessment: See above.  3. Tobacco abuse: He is advised to stop smoking.

## 2012-01-05 NOTE — Telephone Encounter (Signed)
ERROR

## 2012-01-09 ENCOUNTER — Ambulatory Visit (HOSPITAL_COMMUNITY): Payer: Medicare Other | Attending: Internal Medicine | Admitting: Radiology

## 2012-01-09 VITALS — BP 140/88 | HR 71 | Ht 74.0 in | Wt 143.0 lb

## 2012-01-09 DIAGNOSIS — I4510 Unspecified right bundle-branch block: Secondary | ICD-10-CM | POA: Insufficient documentation

## 2012-01-09 DIAGNOSIS — F17200 Nicotine dependence, unspecified, uncomplicated: Secondary | ICD-10-CM | POA: Insufficient documentation

## 2012-01-09 DIAGNOSIS — I25810 Atherosclerosis of coronary artery bypass graft(s) without angina pectoris: Secondary | ICD-10-CM

## 2012-01-09 DIAGNOSIS — R5381 Other malaise: Secondary | ICD-10-CM | POA: Insufficient documentation

## 2012-01-09 DIAGNOSIS — I10 Essential (primary) hypertension: Secondary | ICD-10-CM | POA: Insufficient documentation

## 2012-01-09 DIAGNOSIS — E119 Type 2 diabetes mellitus without complications: Secondary | ICD-10-CM | POA: Insufficient documentation

## 2012-01-09 DIAGNOSIS — I2510 Atherosclerotic heart disease of native coronary artery without angina pectoris: Secondary | ICD-10-CM

## 2012-01-09 DIAGNOSIS — Z01810 Encounter for preprocedural cardiovascular examination: Secondary | ICD-10-CM

## 2012-01-09 DIAGNOSIS — Z8249 Family history of ischemic heart disease and other diseases of the circulatory system: Secondary | ICD-10-CM | POA: Insufficient documentation

## 2012-01-09 MED ORDER — TECHNETIUM TC 99M SESTAMIBI GENERIC - CARDIOLITE
30.0000 | Freq: Once | INTRAVENOUS | Status: AC | PRN
  Administered 2012-01-09: 30 via INTRAVENOUS

## 2012-01-09 MED ORDER — REGADENOSON 0.4 MG/5ML IV SOLN
0.4000 mg | Freq: Once | INTRAVENOUS | Status: AC
  Administered 2012-01-09: 0.4 mg via INTRAVENOUS

## 2012-01-09 MED ORDER — TECHNETIUM TC 99M SESTAMIBI GENERIC - CARDIOLITE
10.0000 | Freq: Once | INTRAVENOUS | Status: AC | PRN
  Administered 2012-01-09: 10 via INTRAVENOUS

## 2012-01-09 NOTE — Progress Notes (Signed)
[facility] SITE 3 NUCLEAR MED [address] [place] [address] [phone]  Cardiology Nuclear Med Study  [name] is a 78 y.o. male     MRN : [id number]     DOB: 19-May-1930  Procedure Date: 01/09/2012  Nuclear Med Background Indication for Stress Test:  Evaluation for Ischemia and Pending Clearance for Ablation of  Renal Tumor by Dr. Irish Lack History:  '03 Cath:Totally occluded RCA w/moderate LAD/Cfx dz-continue medical tx.; '08 OZH:YQMVH infero-basal infarct with P.I. ischemia Cardiac Risk Factors: Family History - CAD, Hypertension, IDDM Type 1, Lipids, PVD, RBBB and Smoker  Symptoms:  Constant leg fatigue.   Nuclear Pre-Procedure Caffeine/Decaff Intake:  None NPO After: 8:30am   Lungs:  Clear. O2 Sat: 98% on room air. IV 0.9% NS with Angio Cath:  22g  IV Site: R Forearm  IV Started by:  Stanton Kidney, EMT-P  Chest Size (in):  38 Cup Size: n/a  Height: 6\' 2"  (1.88 m)  Weight:  143 lb (64.864 kg)  BMI:  Body mass index is 18.36 kg/(m^2). Tech Comments:  Aggrenox held > 72 hours, per patient    Nuclear Med Study 1 or 2 day study: 1 day  Stress Test Type:  Lexiscan  Reading MD: Dietrich Pates, MD  Order Authorizing Provider:  Verne Carrow, MD  Resting Radionuclide: Technetium 44m Sestamibi  Resting Radionuclide Dose: 11.0 mCi   Stress Radionuclide:  Technetium 34m Sestamibi  Stress Radionuclide Dose: 33.0 mCi           Stress Protocol Rest HR: 71 Stress HR: 79  Rest BP: 140/88 Stress BP: 136/81  Exercise Time (min): n/a METS: n/a   Predicted Max HR: 141 bpm % Max HR: 56.03 bpm Rate Pressure Product: 84696   Dose of Adenosine (mg):  n/a Dose of Lexiscan: 0.4 mg  Dose of Atropine (mg): n/a Dose of Dobutamine: n/a mcg/kg/min (at max HR)  Stress Test Technologist: Smiley Houseman, CMA-N  Nuclear Technologist:  Domenic Polite, CNMT     Rest Procedure:  Myocardial perfusion imaging was performed at rest 45 minutes following the  intravenous administration of Technetium 24m Sestamibi.  Rest ECG: RBBB with first degree AV block.  Stress Procedure:  The patient received IV Lexiscan 0.4 mg over 15-seconds.  Technetium 43m Sestamibi injected at 30-seconds.  There were no significant changes with Lexiscan, occasional PVC's.  Quantitative spect images were obtained after a 45 minute delay.  Stress ECG: No significant change from baseline ECG  QPS Raw Data Images:  Soft tissue (diaphragm, bowel acitivty) underlies heart.  Images were motion corrected. Stress Images:  Thinning with decrease tracer activity in the basal inferior/inferoseptal walls.  Otherwse normal perfusion. Rest Images:  No significant change from the stress images. Subtraction (SDS):  Borderline inferoseptal ischemia. Transient Ischemic Dilatation (Normal <1.22):  1.02 Lung/Heart Ratio (Normal <0.45):  0.34  Quantitative Gated Spect Images QGS EDV:  125 ml QGS ESV:  84 ml  Impression Exercise Capacity:  Lexiscan with no exercise. BP Response:  Normal blood pressure response. Clinical Symptoms:  No chest pain. ECG Impression:  No significant ST segment change suggestive of ischemia. Comparison with Prior Nuclear Study:  No significant change from report of 2008  Overall Impression:  Small area of basal inferior/inferoseptal thinning consistent with scar and possible soft tissue attenuation.  Normal perfusion elsewhere.  No ischemia.  LV Ejection Fraction: 33%.  LV Wall Motion:  Visually LVEF appears better than calculated.  Consider echo to evaluate.

## 2012-01-10 ENCOUNTER — Ambulatory Visit (HOSPITAL_COMMUNITY): Payer: Medicare Other | Attending: Cardiovascular Disease | Admitting: Radiology

## 2012-01-10 DIAGNOSIS — E119 Type 2 diabetes mellitus without complications: Secondary | ICD-10-CM | POA: Insufficient documentation

## 2012-01-10 DIAGNOSIS — I059 Rheumatic mitral valve disease, unspecified: Secondary | ICD-10-CM | POA: Insufficient documentation

## 2012-01-10 DIAGNOSIS — I369 Nonrheumatic tricuspid valve disorder, unspecified: Secondary | ICD-10-CM | POA: Insufficient documentation

## 2012-01-10 DIAGNOSIS — Z01810 Encounter for preprocedural cardiovascular examination: Secondary | ICD-10-CM

## 2012-01-10 DIAGNOSIS — I25810 Atherosclerosis of coronary artery bypass graft(s) without angina pectoris: Secondary | ICD-10-CM

## 2012-01-10 DIAGNOSIS — I2510 Atherosclerotic heart disease of native coronary artery without angina pectoris: Secondary | ICD-10-CM

## 2012-01-10 DIAGNOSIS — I10 Essential (primary) hypertension: Secondary | ICD-10-CM | POA: Insufficient documentation

## 2012-01-10 DIAGNOSIS — F17200 Nicotine dependence, unspecified, uncomplicated: Secondary | ICD-10-CM | POA: Insufficient documentation

## 2012-01-10 NOTE — Progress Notes (Signed)
Echocardiogram performed.  

## 2012-01-11 ENCOUNTER — Ambulatory Visit (INDEPENDENT_AMBULATORY_CARE_PROVIDER_SITE_OTHER): Payer: Medicare Other | Admitting: Cardiovascular Disease

## 2012-01-11 ENCOUNTER — Encounter: Payer: Self-pay | Admitting: Cardiovascular Disease

## 2012-01-11 VITALS — BP 130/67 | HR 75 | Ht 74.0 in | Wt 145.0 lb

## 2012-01-11 DIAGNOSIS — I421 Obstructive hypertrophic cardiomyopathy: Secondary | ICD-10-CM

## 2012-01-11 DIAGNOSIS — I10 Essential (primary) hypertension: Secondary | ICD-10-CM

## 2012-01-11 DIAGNOSIS — I2510 Atherosclerotic heart disease of native coronary artery without angina pectoris: Secondary | ICD-10-CM

## 2012-01-11 DIAGNOSIS — Z720 Tobacco use: Secondary | ICD-10-CM

## 2012-01-11 DIAGNOSIS — F17200 Nicotine dependence, unspecified, uncomplicated: Secondary | ICD-10-CM

## 2012-01-11 LAB — BASIC METABOLIC PANEL
Calcium: 8.8 mg/dL (ref 8.4–10.5)
GFR: 46.13 mL/min — ABNORMAL LOW (ref >60.00)
Potassium: 4 mEq/L (ref 3.5–5.1)
Sodium: 140 mEq/L (ref 135–145)

## 2012-01-11 NOTE — Patient Instructions (Addendum)
Your physician recommends that you schedule a follow-up appointment in: 1 month  Your physician has requested that you have a cardiac MRI. Cardiac MRI uses a computer to create images of your heart as its beating, producing both still and moving pictures of your heart and major blood vessels. For further information please visit [email or web address]. Please follow the instruction sheet given to you today for more information.

## 2012-01-11 NOTE — Progress Notes (Signed)
History of Present Illness: 78 yo AAM with h/o PVD, CAD, HTN, hyperlipidemia, DM here for followup. His last cardiac cath was in October of 2003 and showed moderate disease. No coronary interventions were performed. He has a previous aortobifemoral bypass performed by Dr. Hart Rochester in 2002 with endarterectomy of the distal aorta as well as the right common femoral and distal external iliac artery. He was seen as a new patient in my office for evaluation of bilateral lower extremity fatigue in August of 2010. He told me at the initial visit that his legs both felt tired. There was some pain in the left leg with walking. He continued to smoke one pack per day. He had not kept appointments with Dr. Hart Rochester over the prior 5 years. His non-invasive imaging in May 2010 demonstrated high grade stenosis of the left profunda with heavy calcification in both SFA, decreased TBI bilaterally. We sent him to see Dr. Hart Rochester and an angiogram was arranged. Dr. Myra Gianotti performed the angiogram on October 14th, 2010. He was found to have patent aorto-bifem bypass graft with total occlusion of the left SFA at the ostium and high grade disease in the ostium of the left profunda femoral artery. There was reconstitution at the popliteal with likely high grade disease in the tibial vessels. His PV disease has been followed in [facility]. I last saw him in May 2012. Since then, he underwent right renal artery stenting for severe right renal artery stenosis per Dr. Myra Gianotti. He has been found to have a left renal mass and has plans for elective ablation of left renal tumor per Dr. Fredia Sorrow with IR. I arranged an echo and Lexiscan stress myoview. Stress test 01/09/12 without ischemia. Echo with normal LV function but there is severe left ventricular hypertrophy which could be consistent with infiltrative cardiomyopathy such as amyloidosis. There is grade 4 diastolic dysfunction with restrictive features.  He is here today for cardiac follow up and  pre-procedure risk assessment. He is feeling well overall. No chest pain, SOB, palpitations, near syncope or syncope. His legs are not painful with walking. He does describe constant fatigue in the legs. His left foot is cold all of the time. He has not noticed any skin changes or ulcerations. He continues to smoke cigarettes.  Primary Care Physician: Romero Belling   Primary Care Physician:  Last Lipid Profile:Lipid Panel     Component Value Date/Time   CHOL 100 08/04/2011 1015   TRIG 56.0 08/04/2011 1015   HDL 45.80 08/04/2011 1015   CHOLHDL 2 08/04/2011 1015   VLDL 11.2 08/04/2011 1015   LDLCALC 43 08/04/2011 1015     Past Medical History  Diagnosis Date  . Hypertension   . Anemia   . PAD (peripheral artery disease) 10/10    Angiogram  Dr Myra Gianotti , severe stenosis left profunda after insertion of aorto-bifemoral  bypass graft-total occulsionof left SFA. Collateral flow through the profunda femoral artery.  Marland Kitchen CAD in native artery 2003    by cath   . Hypercholesteremia   . Thyroid disease   . Thyroiditis, unspecified   . Osteoarthritis of spine   . Olecranon bursitis   . 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 GERD (gastroesophageal reflux disease)   . Diabetes mellitus   . History of colonic polyps   . Iron deficiency anemia, unspecified   . Leg pain   . Numbness   . History of colonoscopy   . Chronic kidney disease  Past Surgical History  Procedure Date  . Appendectomy   . Aortobifemoral bypass 2002  . Electrocardiogram 01/12/2006  . Stress cardiolite 04/14/2003  . Pr vein bypass graft,aorto-fem-pop 2002  . Cholecystectomy   . Renal artery stent 11/16/2010    Right renal artery    Current Outpatient Prescriptions  Medication Sig Dispense Refill  . atenolol (TENORMIN) 25 MG tablet TAKE 1 TABLET DAILY  90 tablet  2  . calcitRIOL (ROCALTROL) 0.5 MCG capsule Take 0.5 mcg by mouth daily.        Marland Kitchen dipyridamole-aspirin (AGGRENOX) 25-200 MG per 12  hr capsule TAKE 1 CAPSULE TWICE A DAY  180 capsule  2  . ezetimibe-simvastatin (VYTORIN) 10-40 MG per tablet TAKE 1 TABLET ONCE DAILY  90 tablet  1  . HYDROcodone-acetaminophen (NORCO) 10-325 MG per tablet Take 1 tablet by mouth every 4 (four) hours as needed for pain.  50 tablet  3  . insulin lispro protamine-insulin lispro (HUMALOG 75/25) (75-25) 100 UNIT/ML SUSP Inject into the skin 2 (two) times daily with a meal, 16 units with breakfast, and 10 units with the evening meal.  45 mL  3  . Insulin Pen Needle (B-D ULTRAFINE III SHORT PEN) 31G X 8 MM MISC by Does not apply route 2 (two) times daily.        . ONE TOUCH ULTRA TEST test strip USE AS DIRECTED  100 each  5  . sildenafil (VIAGRA) 100 MG tablet Take 100 mg by mouth as needed.       . Vitamin D, Ergocalciferol, (DRISDOL) 50000 UNITS CAPS Take 50,000 Units by mouth. Taking Twice a Week        Allergies  Allergen Reactions  . Clopidogrel Bisulfate Hives    History   Social History  . Marital Status: Married    Spouse Name: N/A    Number of Children: 2  . Years of Education: N/A   Occupational History  . Hydrologist     Retired   Social History Main Topics  . Smoking status: Current Every Day Smoker -- 1.0 packs/day for 60 years    Types: Cigarettes  . Smokeless tobacco: Not on file  . Alcohol Use: No  . Drug Use: No  . Sexually Active: Not on file   Other Topics Concern  . 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 Kidney disease Sister   . Heart attack Father   . Coronary artery disease Father   . Cancer Neg Hx     Review of Systems:  As stated in the HPI and otherwise negative.   BP 130/67  Pulse 75  Ht 6\' 2"  (1.88 m)  Wt 145 lb (65.772 kg)  BMI 18.62 kg/m2  Physical Examination: General: Well developed, well nourished, NAD HEENT: OP clear, mucus  membranes moist SKIN: warm, dry. No rashes. Neuro: No focal deficits Musculoskeletal: Muscle strength 5/5 all ext Psychiatric: Mood and affect normal Neck: No JVD, no carotid bruits, no thyromegaly, no lymphadenopathy. Lungs:Clear bilaterally, no wheezes, rhonci, crackles Cardiovascular: Regular rate and rhythm. No murmurs, gallops or rubs. Abdomen:Soft. Bowel sounds present. Non-tender.  Extremities: No lower extremity edema. Pulses are 2 + in the bilateral DP/PT.  Echo 01/10/12: Left ventricle: Posterior basal akinesis The cavity size was mildly dilated. Wall thickness was increased in a pattern of severe LVH. There was concentric  hypertrophy. Systolic function was mildly reduced. The estimated ejection fraction was in the range of 45% to 50%. There was no dynamic obstruction. Diffuse hypokinesis. Doppler parameters are consistent with an irreversible restrictive pattern, indicative of decreased left ventricular diastolic compliance and/or increased left atrial pressure (grade 4 diastolic dysfunction). Wall mass: 298.6g (M-mode). Wall mass index: 157.1g/m^2 (M-mode). Mass/height ratio: 1.59g/cm (M-mode). - Mitral valve: Mildly calcified annulus. Mildly thickened leaflets . - Left atrium: The atrium was moderately dilated. - Right ventricle: There was moderate hypertrophy. Systolic function was reduced. - Right atrium: The atrium was mildly to moderately dilated. - Pulmonary arteries: Systolic pressure was mildly increased. PA peak pressure: 42mm Hg (S). - Impressions: Clinical correlation suggested for diagnosis of Amyloid  Lexiscan stress myoview 01/09/12:  Stress Procedure: The patient received IV Lexiscan 0.4 mg over 15-seconds. Technetium 36m Sestamibi injected at 30-seconds. There were no significant changes with Lexiscan, occasional PVC's. Quantitative spect images were obtained after a 45 minute delay.  Stress ECG: No significant change from baseline ECG  QPS  Raw Data  Images: Soft tissue (diaphragm, bowel acitivty) underlies heart. Images were motion corrected.  Stress Images: Thinning with decrease tracer activity in the basal inferior/inferoseptal walls. Otherwse normal perfusion.  Rest Images: No significant change from the stress images.  Subtraction (SDS): Borderline inferoseptal ischemia.  Transient Ischemic Dilatation (Normal <1.22): 1.02  Lung/Heart Ratio (Normal <0.45): 0.34  Quantitative Gated Spect Images  QGS EDV: 125 ml  QGS ESV: 84 ml  Impression  Exercise Capacity: Lexiscan with no exercise.  BP Response: Normal blood pressure response.  Clinical Symptoms: No chest pain.  ECG Impression: No significant ST segment change suggestive of ischemia.  Comparison with Prior Nuclear Study: No significant change from report of 2008  Overall Impression: Small area of basal inferior/inferoseptal thinning consistent with scar and possible soft tissue attenuation. Normal perfusion elsewhere. No ischemia.  LV Ejection Fraction: 33%. LV Wall Motion: Visually LVEF appears better than calculated. Consider echo to evaluate.   Assessment and Plan:   1. CAD: He is known to have moderately severe CAD with totally occluded RCA and moderate disease in LAD and Circumflex by cath in 2003. Stress test 01/09/12 without ischemia. Echo with normal LV function but there is severe left ventricular hypertrophy which could be consistent with infiltrative cardiomyopathy such as amyloidosis. There is grade 4 diastolic dysfunction with restrictive features. Continue beta blocker, statin, ASA. No further ischemic workup before his planned surgical procedure.   2. Pre-operative risk assessment: He can proceed with his planned procedure.   3. Tobacco abuse: He is advised to stop smoking.   4. Hypertrophic cardiomyopathy: Severe LVH. Cannot exclude amyloidosis with restrictive features and appearance on echo. Will arrange cardiac MRI to assess myocardium. Will ask Dr. Fredia Sorrow if  we could possibly get a fat pad biopsy to look for amyloid.

## 2012-01-16 ENCOUNTER — Other Ambulatory Visit (HOSPITAL_COMMUNITY): Payer: Self-pay | Admitting: Interventional Radiology

## 2012-01-23 ENCOUNTER — Ambulatory Visit (HOSPITAL_COMMUNITY)
Admission: RE | Admit: 2012-01-23 | Discharge: 2012-01-23 | Disposition: A | Discharge status: Home / Self Care | Payer: Medicare Other | Source: Ambulatory Visit | Attending: Cardiovascular Disease | Admitting: Cardiovascular Disease

## 2012-01-23 DIAGNOSIS — I2510 Atherosclerotic heart disease of native coronary artery without angina pectoris: Secondary | ICD-10-CM

## 2012-01-23 DIAGNOSIS — Z1389 Encounter for screening for other disorder: Secondary | ICD-10-CM | POA: Insufficient documentation

## 2012-01-23 DIAGNOSIS — I421 Obstructive hypertrophic cardiomyopathy: Secondary | ICD-10-CM

## 2012-01-23 DIAGNOSIS — I059 Rheumatic mitral valve disease, unspecified: Secondary | ICD-10-CM

## 2012-01-23 MED ORDER — GADOBENATE DIMEGLUMINE 529 MG/ML IV SOLN
20.0000 mL | Freq: Once | INTRAVENOUS | Status: AC
  Administered 2012-01-23: 20 mL via INTRAVENOUS

## 2012-01-25 ENCOUNTER — Other Ambulatory Visit (HOSPITAL_COMMUNITY): Payer: Self-pay | Admitting: Interventional Radiology

## 2012-01-25 DIAGNOSIS — N2889 Other specified disorders of kidney and ureter: Secondary | ICD-10-CM

## 2012-02-02 ENCOUNTER — Ambulatory Visit (INDEPENDENT_AMBULATORY_CARE_PROVIDER_SITE_OTHER): Payer: Medicare Other | Admitting: Endocrinology

## 2012-02-02 ENCOUNTER — Encounter: Payer: Self-pay | Admitting: Endocrinology

## 2012-02-02 ENCOUNTER — Other Ambulatory Visit: Payer: Self-pay | Admitting: *Deleted

## 2012-02-02 VITALS — BP 136/80 | HR 76 | Temp 98.0°F | Wt 143.0 lb

## 2012-02-02 DIAGNOSIS — E859 Amyloidosis, unspecified: Secondary | ICD-10-CM

## 2012-02-02 DIAGNOSIS — E1049 Type 1 diabetes mellitus with other diabetic neurological complication: Secondary | ICD-10-CM

## 2012-02-02 DIAGNOSIS — Z23 Encounter for immunization: Secondary | ICD-10-CM

## 2012-02-02 DIAGNOSIS — E119 Type 2 diabetes mellitus without complications: Secondary | ICD-10-CM

## 2012-02-02 DIAGNOSIS — I70219 Atherosclerosis of native arteries of extremities with intermittent claudication, unspecified extremity: Secondary | ICD-10-CM

## 2012-02-02 NOTE — Progress Notes (Signed)
Subjective:    Patient ID: [name], male    DOB: 05/25/1930, 78 y.o.   MRN: [id number]  HPI Pt returns for f/u of insulin-requiring DM (dx'ed 1996, possibly due to pancreatic failure; complicated by CAD, peripheral sensory neuropathy, renal insufficiency, and PAD; he has done better on a simpler bid insulin regimen).  no cbg record, but states cbg's are well-controlled.   Past Medical History  Diagnosis Date  . Hypertension   . Anemia   . PAD (peripheral artery disease) 10/10    Angiogram  Dr Myra Gianotti , severe stenosis left profunda after insertion of aorto-bifemoral  bypass graft-total occulsionof left SFA. Collateral flow through the profunda femoral artery.  Marland Kitchen CAD in native artery 2003    by cath   . Hypercholesteremia   . Thyroid disease   . Thyroiditis, unspecified   . Osteoarthritis of spine   . Olecranon bursitis   . 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 GERD (gastroesophageal reflux disease)   . Diabetes mellitus   . History of colonic polyps   . Iron deficiency anemia, unspecified   . Leg pain   . Numbness   . History of colonoscopy   . Chronic kidney disease     Past Surgical History  Procedure Date  . Appendectomy   . Aortobifemoral bypass 2002  . Electrocardiogram 01/12/2006  . Stress cardiolite 04/14/2003  . Pr vein bypass graft,aorto-fem-pop 2002  . Cholecystectomy   . Renal artery stent 11/16/2010    Right renal artery    History   Social History  . Marital Status: Married    Spouse Name: N/A    Number of Children: 2  . Years of Education: N/A   Occupational History  . Hydrologist     Retired   Social History Main Topics  . Smoking status: Current Every Day Smoker -- 1.0 packs/day for 60 years    Types: Cigarettes  . Smokeless tobacco: Not on file  . Alcohol Use: No  . Drug Use: No  . Sexually Active: Not on file   Other Topics Concern  . 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 atenolol (TENORMIN) 25 MG tablet TAKE 1 TABLET DAILY  90 tablet  2  . calcitRIOL (ROCALTROL) 0.5 MCG capsule Take 0.5 mcg by mouth daily.        Marland Kitchen dipyridamole-aspirin (AGGRENOX) 25-200 MG per 12 hr capsule TAKE 1 CAPSULE TWICE A DAY  180 capsule  2  . ezetimibe-simvastatin (VYTORIN) 10-40 MG per tablet TAKE 1 TABLET ONCE DAILY  90 tablet  1  . HYDROcodone-acetaminophen (NORCO) 10-325 MG per tablet Take 1 tablet by mouth every 4 (four) hours as needed for pain.  50 tablet  3  . insulin lispro protamine-insulin lispro (HUMALOG 75/25) (75-25) 100 UNIT/ML SUSP Inject into the skin 2 (two) times daily with a meal, 16 units with breakfast, and 10 units with the evening meal.  45 mL  3  . Insulin Pen Needle (B-D ULTRAFINE III SHORT PEN) 31G X 8 MM MISC by Does not apply route 2 (two) times daily.        . ONE TOUCH ULTRA TEST test strip USE AS DIRECTED  100 each  5  . sildenafil (VIAGRA) 100 MG tablet Take 100 mg by mouth as needed.       Marland Kitchen  Vitamin D, Ergocalciferol, (DRISDOL) 50000 UNITS CAPS Take 50,000 Units by mouth. Taking Twice a Week        Allergies  Allergen Reactions  . Clopidogrel Bisulfate Hives    Family History  Problem Relation Age of Onset  . Kidney disease Sister   . Heart attack Father   . Coronary artery disease Father   . Cancer Neg Hx     BP 136/80  Pulse 76  Temp 98 F (36.7 C) (Oral)  Wt 143 lb (64.864 kg)  Review of Systems denies hypoglycemia    Objective:   Physical Exam VITAL SIGNS:  See vs page GENERAL: no distress SKIN:  Insulin injection sites at the anterior abdomen are normal     Assessment & Plan:  DM, apparently well-controlled

## 2012-02-02 NOTE — Patient Instructions (Addendum)
check your blood sugar twice a day.  vary the time of day when you check, between before the 3 meals, and at bedtime.  also check if you have symptoms of your blood sugar being too high or too low.  please keep a record of the readings and bring it to your next appointment here.  please call us sooner if your blood sugar goes below 70, or if it stays over 200.  Please come back for a follow-up appointment in 3 months blood tests are being requested for you today.  You will be contacted with results.

## 2012-02-06 ENCOUNTER — Encounter: Payer: Self-pay | Admitting: Vascular Surgery

## 2012-02-07 ENCOUNTER — Encounter (INDEPENDENT_AMBULATORY_CARE_PROVIDER_SITE_OTHER): Payer: Medicare Other | Admitting: *Deleted

## 2012-02-07 ENCOUNTER — Encounter: Payer: Self-pay | Admitting: Vascular Surgery

## 2012-02-07 ENCOUNTER — Other Ambulatory Visit (INDEPENDENT_AMBULATORY_CARE_PROVIDER_SITE_OTHER): Payer: Medicare Other | Admitting: *Deleted

## 2012-02-07 ENCOUNTER — Ambulatory Visit (INDEPENDENT_AMBULATORY_CARE_PROVIDER_SITE_OTHER): Payer: Medicare Other | Admitting: Vascular Surgery

## 2012-02-07 VITALS — BP 148/73 | HR 62 | Ht 74.0 in | Wt 144.0 lb

## 2012-02-07 DIAGNOSIS — I739 Peripheral vascular disease, unspecified: Secondary | ICD-10-CM

## 2012-02-07 DIAGNOSIS — Z48812 Encounter for surgical aftercare following surgery on the circulatory system: Secondary | ICD-10-CM

## 2012-02-07 DIAGNOSIS — I701 Atherosclerosis of renal artery: Secondary | ICD-10-CM

## 2012-02-07 DIAGNOSIS — I70219 Atherosclerosis of native arteries of extremities with intermittent claudication, unspecified extremity: Secondary | ICD-10-CM

## 2012-02-07 NOTE — Addendum Note (Signed)
Addended by: Melodye Ped C on: 02/07/2012 03:01 PM   Modules accepted: Orders

## 2012-02-07 NOTE — Progress Notes (Signed)
Subjective:     Patient ID: [name], male   DOB: May 17, 1930, 78 y.o.   MRN: [id number]  HPI this 78 year old male returns for continued followup regarding his right renal stent and lower extremity occlusive disease he has a lesion on his right kidney which will be treated with cryotherapy apparently in the near future. His leg medication symptoms have not changed. He does have some numbness in the right hip and thigh not related to activity.  Past Medical History  Diagnosis Date  . Hypertension   . Anemia   . PAD (peripheral artery disease) 10/10    Angiogram  Dr Myra Gianotti , severe stenosis left profunda after insertion of aorto-bifemoral  bypass graft-total occulsionof left SFA. Collateral flow through the profunda femoral artery.  Marland Kitchen CAD in native artery 2003    by cath   . Hypercholesteremia   . Thyroid disease   . Thyroiditis, unspecified   . Osteoarthritis of spine   . Olecranon bursitis   . 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 GERD (gastroesophageal reflux disease)   . Diabetes mellitus   . History of colonic polyps   . Iron deficiency anemia, unspecified   . Leg pain   . Numbness   . History of colonoscopy   . Chronic kidney disease     History  Substance Use Topics  . Smoking status: Current Every Day Smoker -- 1.0 packs/day for 60 years    Types: Cigarettes  . Smokeless tobacco: Never Used  . Alcohol Use: No    Family History  Problem Relation Age of Onset  . Kidney disease Sister   . Heart attack Father   . Coronary artery disease Father   . Cancer Mother     Allergies  Allergen Reactions  . Clopidogrel Bisulfate Hives    Current outpatient prescriptions:atenolol (TENORMIN) 25 MG tablet, TAKE 1 TABLET DAILY, Disp: 90 tablet, Rfl: 2;  calcitRIOL (ROCALTROL) 0.5 MCG capsule, Take 0.5 mcg by mouth daily.  , Disp: , Rfl: ;  dipyridamole-aspirin (AGGRENOX) 25-200 MG per 12 hr capsule, TAKE 1 CAPSULE TWICE A DAY, Disp: 180 capsule,  Rfl: 2;  ezetimibe-simvastatin (VYTORIN) 10-40 MG per tablet, TAKE 1 TABLET ONCE DAILY, Disp: 90 tablet, Rfl: 1 HYDROcodone-acetaminophen (NORCO) 10-325 MG per tablet, Take 1 tablet by mouth every 4 (four) hours as needed for pain., Disp: 50 tablet, Rfl: 3;  insulin lispro protamine-insulin lispro (HUMALOG 75/25) (75-25) 100 UNIT/ML SUSP, Inject into the skin 2 (two) times daily with a meal, 16 units with breakfast, and 10 units with the evening meal., Disp: 45 mL, Rfl: 3 Insulin Pen Needle (B-D ULTRAFINE III SHORT PEN) 31G X 8 MM MISC, by Does not apply route 2 (two) times daily.  , Disp: , Rfl: ;  ONE TOUCH ULTRA TEST test strip, USE AS DIRECTED, Disp: 100 each, Rfl: 5;  Vitamin D, Ergocalciferol, (DRISDOL) 50000 UNITS CAPS, Take 50,000 Units by mouth. Taking Twice a Week, Disp: , Rfl: ;  sildenafil (VIAGRA) 100 MG tablet, Take 100 mg by mouth as needed. , Disp: , Rfl:   BP 148/73  Pulse 62  Ht 6\' 2"  (1.88 m)  Wt 144 lb (65.318 kg)  BMI 18.49 kg/m2  SpO2 100%  Body mass index is 18.49 kg/(m^2).          Review of Systems denies chest pain, dyspnea on exertion, PND, orthopnea, hemoptysis the    Objective:   Physical Exam blood pressure 140/73 heart rate 60 respirations  18 Gen.-alert and oriented x3 in no apparent distress HEENT normal for age Lungs no rhonchi or wheezing Cardiovascular regular rhythm no murmurs carotid pulses 3+ palpable no bruits audible Abdomen soft nontender no palpable masses Musculoskeletal free of  major deformities Skin clear -no rashes Neurologic normal Lower extremities 3+ femoral pulses bilaterally. Both the well-perfused with no ischemic ulcers  Data ordered duplex scan of the renal artery stent on the right which appears to be widely patent. There is a moderate left renal artery stenosis which may have progressed slightly. ABI is 0.53 on the right and 0.43 on the left ear     Assessment:     Stable lower extremity claudication due to femoral  popliteal occlusive disease and widely patent right renal artery stent    Plan:     Return in one year for ABIs and duplex scan the right renal artery stent and left renal artery stenosis

## 2012-02-16 ENCOUNTER — Ambulatory Visit (INDEPENDENT_AMBULATORY_CARE_PROVIDER_SITE_OTHER): Payer: Medicare Other | Admitting: Cardiovascular Disease

## 2012-02-16 ENCOUNTER — Encounter: Payer: Self-pay | Admitting: Cardiovascular Disease

## 2012-02-16 VITALS — BP 112/64 | HR 65 | Ht 74.0 in | Wt 150.0 lb

## 2012-02-16 DIAGNOSIS — Z720 Tobacco use: Secondary | ICD-10-CM

## 2012-02-16 DIAGNOSIS — I2510 Atherosclerotic heart disease of native coronary artery without angina pectoris: Secondary | ICD-10-CM

## 2012-02-16 DIAGNOSIS — I428 Other cardiomyopathies: Secondary | ICD-10-CM

## 2012-02-16 DIAGNOSIS — F17200 Nicotine dependence, unspecified, uncomplicated: Secondary | ICD-10-CM

## 2012-02-16 DIAGNOSIS — I425 Other restrictive cardiomyopathy: Secondary | ICD-10-CM

## 2012-02-16 NOTE — Patient Instructions (Addendum)
You have a follow up appt scheduled with Dr. Clifton James on March 29, 2012 at 11:15

## 2012-02-16 NOTE — Progress Notes (Signed)
History of Present Illness: 78 yo AAM with h/o PVD, CAD, HTN, hyperlipidemia, DM here for cardiac followup. His last cardiac cath was in October of 2003 and showed moderate disease. No coronary interventions were performed. He has a previous aortobifemoral bypass performed by Dr. Hart Rochester in 2002 with endarterectomy of the distal aorta as well as the right common femoral and distal external iliac artery. He was seen as a new patient in my office for evaluation of bilateral lower extremity fatigue in August of 2010. There was some pain in the left leg with walking. He continued to smoke one pack per day. He had not kept appointments with Dr. Hart Rochester over the prior 5 years. His non-invasive imaging in May 2010 demonstrated high grade stenosis of the left profunda with heavy calcification in both SFA, decreased TBI bilaterally. We sent him to see Dr. Hart Rochester and an angiogram was arranged. Dr. Myra Gianotti performed the angiogram on October 14th, 2010. He was found to have patent aorto-bifem bypass graft with total occlusion of the left SFA at the ostium and high grade disease in the ostium of the left profunda femoral artery. There was reconstitution at the popliteal with likely high grade disease in the tibial vessels. His PV disease has been followed in [facility]. I last saw him in May 2012. Since then, he underwent right renal artery stenting for severe right renal artery stenosis per Dr. Myra Gianotti.  He has been found to have a left renal mass and has plans for elective ablation of left renal tumor per Dr. Fredia Sorrow with IR. I arranged an echo and Lexiscan stress myoview. Stress test 01/09/12 without ischemia. Echo with normal LV function but there is severe left ventricular hypertrophy which was worrisome for infiltrative cardiomyopathy such as amyloidosis. There is grade 4 diastolic dysfunction with restrictive features. Cardiac MRI was arranged 01/23/12 and was consistent with amyloidosis (see full report below). Serum was  sent for amyloidosis by Dr. Everardo All. Plans for fat pad biopsy when he is seen in interventional radiology for renal mass ablation.   He is here today for cardiac follow up. He is feeling well overall. No chest pain, SOB, palpitations, near syncope or syncope. He continues to smoke cigarettes. Plans for renal mass cryoablation 03/02/12.   Primary Care Physician: Romero Belling  Last Lipid Profile:Lipid Panel     Component Value Date/Time   CHOL 100 08/04/2011 1015   TRIG 56.0 08/04/2011 1015   HDL 45.80 08/04/2011 1015   CHOLHDL 2 08/04/2011 1015   VLDL 11.2 08/04/2011 1015   LDLCALC 43 08/04/2011 1015     Past Medical History  Diagnosis Date  . Hypertension   . Anemia   . PAD (peripheral artery disease) 10/10    Angiogram  Dr Myra Gianotti , severe stenosis left profunda after insertion of aorto-bifemoral  bypass graft-total occulsionof left SFA. Collateral flow through the profunda femoral artery.  Marland Kitchen CAD in native artery 2003    by cath   . Hypercholesteremia   . Thyroid disease   . Thyroiditis, unspecified   . Osteoarthritis of spine   . Olecranon bursitis   . 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 GERD (gastroesophageal reflux disease)   . Diabetes mellitus   . History of colonic polyps   . Iron deficiency anemia, unspecified   . Leg pain   . Numbness   . History of colonoscopy   . Chronic kidney disease     Past Surgical History  Procedure Date  .  Appendectomy   . Aortobifemoral bypass 2002  . Electrocardiogram 01/12/2006  . Stress cardiolite 04/14/2003  . Pr vein bypass graft,aorto-fem-pop 2002  . Cholecystectomy   . Renal artery stent 11/16/2010    Right renal artery    Current Outpatient Prescriptions  Medication Sig Dispense Refill  . atenolol (TENORMIN) 25 MG tablet TAKE 1 TABLET DAILY  90 tablet  2  . calcitRIOL (ROCALTROL) 0.5 MCG capsule Take 0.5 mcg by mouth daily.        Marland Kitchen dipyridamole-aspirin (AGGRENOX) 25-200 MG per 12 hr capsule TAKE  1 CAPSULE TWICE A DAY  180 capsule  2  . ezetimibe-simvastatin (VYTORIN) 10-40 MG per tablet TAKE 1 TABLET ONCE DAILY  90 tablet  1  . HYDROcodone-acetaminophen (NORCO) 10-325 MG per tablet Take 1 tablet by mouth every 4 (four) hours as needed for pain.  50 tablet  3  . insulin lispro protamine-insulin lispro (HUMALOG 75/25) (75-25) 100 UNIT/ML SUSP Inject into the skin 2 (two) times daily with a meal, 16 units with breakfast, and 10 units with the evening meal.  45 mL  3  . Insulin Pen Needle (B-D ULTRAFINE III SHORT PEN) 31G X 8 MM MISC by Does not apply route 2 (two) times daily.        . ONE TOUCH ULTRA TEST test strip USE AS DIRECTED  100 each  5  . sildenafil (VIAGRA) 100 MG tablet Take 100 mg by mouth as needed.       . Vitamin D, Ergocalciferol, (DRISDOL) 50000 UNITS CAPS Take 50,000 Units by mouth. Taking Twice a Week        Allergies  Allergen Reactions  . Clopidogrel Bisulfate Hives    History   Social History  . Marital Status: Married    Spouse Name: N/A    Number of Children: 2  . Years of Education: N/A   Occupational History  . Hydrologist     Retired   Social History Main Topics  . Smoking status: Current Every Day Smoker -- 1.0 packs/day for 60 years    Types: Cigarettes  . Smokeless tobacco: Never Used  . Alcohol Use: No  . Drug Use: No  . Sexually Active: Not on file   Other Topics Concern  . 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 Kidney disease Sister   . Heart attack Father   . Coronary artery disease Father   . Cancer Mother     Review of Systems:  As stated in the HPI and otherwise negative.   BP 112/64  Pulse 65  Ht 6\' 2"  (1.88 m)  Wt 150 lb (68.04 kg)  BMI 19.26 kg/m2  Physical Examination: General: Well developed, well nourished, NAD HEENT: OP clear, mucus membranes moist SKIN:  warm, dry. No rashes. Neuro: No focal deficits Musculoskeletal: Muscle strength 5/5 all ext Psychiatric: Mood and affect normal Neck: No JVD, no carotid bruits, no thyromegaly, no lymphadenopathy. Lungs:Clear bilaterally, no wheezes, rhonci, crackles Cardiovascular: Regular rate and rhythm. Systolic murmur. No gallops or rubs. Abdomen:Soft. Bowel sounds present. Non-tender.  Extremities: No lower extremity edema. Pulses are trace  in the bilateral DP/PT.  Cardiac MRI:  01/23/12: 1. Normal LV size with moderate to severe LVH, somewhat asymmetric involvement of the septum compared to the lateral wall. No systolic anterior motion of the mitral valve. EF 44% with  mild global hypokinesis, basal inferior severe hypokinesis to akinesis.  2. Widespread delayed enhancement pattern that does not appear to be primarily in a coronary distribution, including involvement of the interatrial septum and the RV free wall. This raises concern for an infiltrative disease with cardiac amyloidosis being highest on the differential. The inferior wall motion abnormality in association with basal inferior/inferoseptal/inferolateral subendocardial delayed enhancement could be a coronary disease pattern (prior NSTEMI) but given overall picture it may simply be a manifestation of the infiltrative disease.  3. Needs full workup for cardiac amyloidosis, would start with serum/urine immunofixation and fat pad biopsy.  Assessment and Plan:   1. CAD: He is known to have moderately severe CAD with totally occluded RCA and moderate disease in LAD and Circumflex by cath in 2003. Stress test 01/09/12 without ischemia. Echo with normal LV function but there is severe left ventricular hypertrophy which appears to be consistent with an infiltrative cardiomyopathy such as amyloidosis. Cardiac MRI is also worrisome for amyloidosis.  Continue beta blocker, statin, ASA. No further ischemic workup before his planned surgical  procedure.   2. Hypertrophic cardiomyopathy: Features c/w restrictive cardiomyopathy. Cardiac MRI c/w amyloidosis. Will ask Dr. Fredia Sorrow if we could possibly get a fat pad biopsy to look for amyloid on the same day that he has his procedure for renal mass ablation in IR. He did not go to the lab 2 weeks ago to have the serum and urine immunofixation as ordered by Dr. Everardo All. He does not wish to do it today. Will arrange at f/u visit in 6 weeks.   3. Pre-operative risk assessment: He can proceed with his planned procedure. No ischemia on stress testing.   4. Tobacco abuse: He is advised to stop smoking.

## 2012-02-20 ENCOUNTER — Other Ambulatory Visit: Payer: Self-pay | Admitting: Cardiovascular Disease

## 2012-02-22 ENCOUNTER — Encounter (HOSPITAL_COMMUNITY): Payer: Self-pay | Admitting: Pharmacy Technician

## 2012-02-22 ENCOUNTER — Other Ambulatory Visit: Payer: Self-pay | Admitting: Radiology

## 2012-02-28 ENCOUNTER — Ambulatory Visit (HOSPITAL_COMMUNITY)
Admission: RE | Admit: 2012-02-28 | Discharge: 2012-02-28 | Disposition: A | Discharge status: Home / Self Care | Payer: Medicare Other | Source: Ambulatory Visit | Attending: Interventional Radiology | Admitting: Interventional Radiology

## 2012-02-28 ENCOUNTER — Encounter (HOSPITAL_COMMUNITY)
Admission: RE | Admit: 2012-02-28 | Discharge: 2012-02-28 | Disposition: A | Discharge status: Home / Self Care | Payer: Medicare Other | Source: Ambulatory Visit | Attending: Interventional Radiology | Admitting: Interventional Radiology

## 2012-02-28 ENCOUNTER — Encounter (HOSPITAL_COMMUNITY): Payer: Self-pay

## 2012-02-28 DIAGNOSIS — I428 Other cardiomyopathies: Secondary | ICD-10-CM | POA: Insufficient documentation

## 2012-02-28 DIAGNOSIS — I739 Peripheral vascular disease, unspecified: Secondary | ICD-10-CM | POA: Insufficient documentation

## 2012-02-28 DIAGNOSIS — F17200 Nicotine dependence, unspecified, uncomplicated: Secondary | ICD-10-CM | POA: Insufficient documentation

## 2012-02-28 DIAGNOSIS — I10 Essential (primary) hypertension: Secondary | ICD-10-CM | POA: Insufficient documentation

## 2012-02-28 DIAGNOSIS — I2510 Atherosclerotic heart disease of native coronary artery without angina pectoris: Secondary | ICD-10-CM | POA: Insufficient documentation

## 2012-02-28 DIAGNOSIS — E119 Type 2 diabetes mellitus without complications: Secondary | ICD-10-CM | POA: Insufficient documentation

## 2012-02-28 DIAGNOSIS — Z01812 Encounter for preprocedural laboratory examination: Secondary | ICD-10-CM | POA: Insufficient documentation

## 2012-02-28 DIAGNOSIS — Z01810 Encounter for preprocedural cardiovascular examination: Secondary | ICD-10-CM | POA: Insufficient documentation

## 2012-02-28 DIAGNOSIS — E785 Hyperlipidemia, unspecified: Secondary | ICD-10-CM | POA: Insufficient documentation

## 2012-02-28 LAB — BASIC METABOLIC PANEL WITH GFR
BUN: 29 mg/dL — ABNORMAL HIGH (ref 6–23)
CO2: 23 meq/L (ref 19–32)
Calcium: 9.5 mg/dL (ref 8.4–10.5)
Chloride: 104 meq/L (ref 96–112)
Creatinine, Ser: 1.67 mg/dL — ABNORMAL HIGH (ref 0.50–1.35)
GFR calc Af Amer: 43 mL/min — ABNORMAL LOW
GFR calc non Af Amer: 37 mL/min — ABNORMAL LOW
Glucose, Bld: 141 mg/dL — ABNORMAL HIGH (ref 70–99)
Potassium: 4.4 meq/L (ref 3.5–5.1)
Sodium: 137 meq/L (ref 135–145)

## 2012-02-28 LAB — APTT: aPTT: 43 s — ABNORMAL HIGH (ref 24–37)

## 2012-02-28 LAB — ABO/RH: ABO/RH(D): B POS

## 2012-02-28 LAB — PROTIME-INR
INR: 1.16 (ref 0.00–1.49)
Prothrombin Time: 14.6 s (ref 11.6–15.2)

## 2012-02-28 LAB — CBC
MCH: 26.8 pg (ref 26.0–34.0)
Platelets: 169 10*3/uL (ref 150–400)
RBC: 4.7 MIL/uL (ref 4.22–5.81)
WBC: 6.8 10*3/uL (ref 4.0–10.5)

## 2012-02-28 NOTE — Progress Notes (Signed)
02/28/12 1251  OBSTRUCTIVE SLEEP APNEA  Score 4 or greater  Results sent to PCP

## 2012-02-28 NOTE — Patient Instructions (Signed)
20      Your procedure is scheduled on:  Friday 03/01/2012 at 0800am  Report to [facility] at  0630 AM.  Call this number if you have problems the morning of surgery: [phone]   Remember:TAKE 1/2 HUMALOG INSULIN NIGHT BEFORE SURGERY- USE 5.5 UNITS INSULIN   Do not eat food or drink liquids after midnight!  Take these medicines the morning of surgery with A SIP OF WATER: Atenolol, Vytorin   Do not bring valuables to the hospital.  .  Leave suitcase in the car. After surgery it may be brought to your room.  For patients admitted to the hospital, checkout time is 11:00 AM the day of              Discharge.    Special Instructions: See [facility] Preparing  For Surgery Instruction Sheet.  Do not wear jewelry, lotions powders, perfumes. Women do not shave  legs or underarms for 12 hours before showers. Contacts, partial plates, or dentures may not be worn into surgery.                          Patients discharged the day of surgery will not be allowed to drive home. If going home the same day of surgery, must have someone stay with you  first 24 hrs.at home and arrange for someone to drive you home from the              Hospital. YOUR DRIVER RU:[id number]   Please read over the following fact sheets that you were given: MRSA INFORMATION,INCENTIVE SPIROMETRY SHEET, SLEEP APNEA SHEET, BLOOD TRANSFUSION SHEET                            Telford Nab.Anjoli Diemer,RN,BSN     [phone]

## 2012-03-01 NOTE — Progress Notes (Signed)
Spoke with Caryn Bee, Radiology PA, regarding order for consent. He stated they would obtain consent themselves on day of surgery.

## 2012-03-02 ENCOUNTER — Encounter (HOSPITAL_COMMUNITY): Payer: Self-pay | Admitting: *Deleted

## 2012-03-02 ENCOUNTER — Encounter (HOSPITAL_COMMUNITY): Payer: Self-pay | Admitting: Registered Nurse

## 2012-03-02 ENCOUNTER — Ambulatory Visit (HOSPITAL_COMMUNITY): Payer: Medicare Other | Admitting: Registered Nurse

## 2012-03-02 ENCOUNTER — Observation Stay (HOSPITAL_COMMUNITY)
Admission: RE | Admit: 2012-03-02 | Discharge: 2012-03-03 | Disposition: A | Discharge status: Home / Self Care | Payer: Medicare Other | Source: Ambulatory Visit | Attending: Interventional Radiology | Admitting: Interventional Radiology

## 2012-03-02 ENCOUNTER — Ambulatory Visit (HOSPITAL_COMMUNITY): Payer: Medicare Other

## 2012-03-02 ENCOUNTER — Encounter (HOSPITAL_COMMUNITY)
Admission: RE | Disposition: A | Discharge status: Home / Self Care | Payer: Self-pay | Source: Ambulatory Visit | Attending: Interventional Radiology

## 2012-03-02 ENCOUNTER — Ambulatory Visit (HOSPITAL_COMMUNITY)
Admission: RE | Admit: 2012-03-02 | Discharge: 2012-03-02 | Disposition: A | Discharge status: Home / Self Care | Payer: Medicare Other | Source: Ambulatory Visit | Attending: Interventional Radiology | Admitting: Interventional Radiology

## 2012-03-02 ENCOUNTER — Encounter (HOSPITAL_COMMUNITY): Payer: Self-pay

## 2012-03-02 DIAGNOSIS — N189 Chronic kidney disease, unspecified: Secondary | ICD-10-CM | POA: Insufficient documentation

## 2012-03-02 DIAGNOSIS — E108 Type 1 diabetes mellitus with unspecified complications: Secondary | ICD-10-CM | POA: Insufficient documentation

## 2012-03-02 DIAGNOSIS — M549 Dorsalgia, unspecified: Secondary | ICD-10-CM | POA: Insufficient documentation

## 2012-03-02 DIAGNOSIS — E65 Localized adiposity: Secondary | ICD-10-CM | POA: Insufficient documentation

## 2012-03-02 DIAGNOSIS — I2510 Atherosclerotic heart disease of native coronary artery without angina pectoris: Secondary | ICD-10-CM | POA: Insufficient documentation

## 2012-03-02 DIAGNOSIS — I129 Hypertensive chronic kidney disease with stage 1 through stage 4 chronic kidney disease, or unspecified chronic kidney disease: Secondary | ICD-10-CM | POA: Insufficient documentation

## 2012-03-02 DIAGNOSIS — C649 Malignant neoplasm of unspecified kidney, except renal pelvis: Principal | ICD-10-CM | POA: Insufficient documentation

## 2012-03-02 DIAGNOSIS — E8589 Other amyloidosis: Secondary | ICD-10-CM | POA: Insufficient documentation

## 2012-03-02 DIAGNOSIS — R011 Cardiac murmur, unspecified: Secondary | ICD-10-CM | POA: Insufficient documentation

## 2012-03-02 DIAGNOSIS — N2889 Other specified disorders of kidney and ureter: Secondary | ICD-10-CM | POA: Diagnosis present

## 2012-03-02 DIAGNOSIS — Z79899 Other long term (current) drug therapy: Secondary | ICD-10-CM | POA: Insufficient documentation

## 2012-03-02 DIAGNOSIS — I739 Peripheral vascular disease, unspecified: Secondary | ICD-10-CM | POA: Insufficient documentation

## 2012-03-02 DIAGNOSIS — K219 Gastro-esophageal reflux disease without esophagitis: Secondary | ICD-10-CM | POA: Insufficient documentation

## 2012-03-02 DIAGNOSIS — Z794 Long term (current) use of insulin: Secondary | ICD-10-CM | POA: Insufficient documentation

## 2012-03-02 DIAGNOSIS — E7800 Pure hypercholesterolemia, unspecified: Secondary | ICD-10-CM | POA: Insufficient documentation

## 2012-03-02 LAB — GLUCOSE, CAPILLARY
Glucose-Capillary: 135 mg/dL — ABNORMAL HIGH (ref 70–99)
Glucose-Capillary: 183 mg/dL — ABNORMAL HIGH (ref 70–99)

## 2012-03-02 LAB — TYPE AND SCREEN
ABO/RH(D): B POS
Antibody Screen: NEGATIVE

## 2012-03-02 SURGERY — RADIO FREQUENCY ABLATION
Anesthesia: General | Laterality: Left | Wound class: Clean

## 2012-03-02 MED ORDER — ONDANSETRON HCL 4 MG/2ML IJ SOLN: 4.0000 mg | Freq: Four times a day (QID) | INTRAMUSCULAR | Status: DC | PRN

## 2012-03-02 MED ORDER — HYDROCODONE-ACETAMINOPHEN 5-325 MG PO TABS
1.0000 | ORAL_TABLET | ORAL | Status: DC | PRN
  Administered 2012-03-02 – 2012-03-03 (×4): 1 via ORAL
  Administered 2012-03-03: 2 via ORAL
  Administered 2012-03-03: 1 via ORAL
  Filled 2012-03-02 (×4): qty 1
  Filled 2012-03-02: qty 2
  Filled 2012-03-02: qty 1

## 2012-03-02 MED ORDER — SENNOSIDES-DOCUSATE SODIUM 8.6-50 MG PO TABS
1.0000 | ORAL_TABLET | Freq: Every day | ORAL | Status: DC | PRN
  Filled 2012-03-02: qty 1

## 2012-03-02 MED ORDER — NEOSTIGMINE METHYLSULFATE 1 MG/ML IJ SOLN
INTRAMUSCULAR | Status: DC | PRN
  Administered 2012-03-02: 3.5 mg via INTRAVENOUS

## 2012-03-02 MED ORDER — CALCITRIOL 0.5 MCG PO CAPS
0.5000 ug | ORAL_CAPSULE | Freq: Every day | ORAL | Status: DC
  Administered 2012-03-02 – 2012-03-03 (×2): 0.5 ug via ORAL
  Filled 2012-03-02 (×2): qty 1

## 2012-03-02 MED ORDER — LIDOCAINE HCL (CARDIAC) 20 MG/ML IV SOLN
INTRAVENOUS | Status: DC | PRN
  Administered 2012-03-02: 100 mg via INTRAVENOUS

## 2012-03-02 MED ORDER — EZETIMIBE-SIMVASTATIN 10-40 MG PO TABS
1.0000 | ORAL_TABLET | Freq: Every morning | ORAL | Status: DC
  Administered 2012-03-03: 1 via ORAL
  Filled 2012-03-02: qty 1

## 2012-03-02 MED ORDER — GLYCOPYRROLATE 0.2 MG/ML IJ SOLN
INTRAMUSCULAR | Status: DC | PRN
  Administered 2012-03-02: .5 mg via INTRAVENOUS

## 2012-03-02 MED ORDER — LACTATED RINGERS IV SOLN: INTRAVENOUS | Status: DC

## 2012-03-02 MED ORDER — PROMETHAZINE HCL 25 MG/ML IJ SOLN: 6.2500 mg | INTRAMUSCULAR | Status: DC | PRN

## 2012-03-02 MED ORDER — FENTANYL CITRATE 0.05 MG/ML IJ SOLN
INTRAMUSCULAR | Status: DC | PRN
  Administered 2012-03-02 (×2): 25 ug via INTRAVENOUS
  Administered 2012-03-02: 50 ug via INTRAVENOUS

## 2012-03-02 MED ORDER — FENTANYL CITRATE 0.05 MG/ML IJ SOLN: 25.0000 ug | INTRAMUSCULAR | Status: DC | PRN

## 2012-03-02 MED ORDER — LACTATED RINGERS IV SOLN
INTRAVENOUS | Status: DC
  Administered 2012-03-02: 07:00:00 via INTRAVENOUS

## 2012-03-02 MED ORDER — PROPOFOL 10 MG/ML IV BOLUS
INTRAVENOUS | Status: DC | PRN
  Administered 2012-03-02: 140 mg via INTRAVENOUS

## 2012-03-02 MED ORDER — DOCUSATE SODIUM 100 MG PO CAPS
100.0000 mg | ORAL_CAPSULE | Freq: Two times a day (BID) | ORAL | Status: DC
  Administered 2012-03-02 – 2012-03-03 (×2): 100 mg via ORAL
  Filled 2012-03-02 (×4): qty 1

## 2012-03-02 MED ORDER — MEPERIDINE HCL 50 MG/ML IJ SOLN: 6.2500 mg | INTRAMUSCULAR | Status: DC | PRN

## 2012-03-02 MED ORDER — ROCURONIUM BROMIDE 100 MG/10ML IV SOLN
INTRAVENOUS | Status: DC | PRN
  Administered 2012-03-02: 5 mg via INTRAVENOUS
  Administered 2012-03-02: 10 mg via INTRAVENOUS
  Administered 2012-03-02: 30 mg via INTRAVENOUS
  Administered 2012-03-02: 10 mg via INTRAVENOUS
  Administered 2012-03-02: 5 mg via INTRAVENOUS

## 2012-03-02 MED ORDER — ATENOLOL 25 MG PO TABS
25.0000 mg | ORAL_TABLET | Freq: Every morning | ORAL | Status: DC
  Administered 2012-03-03: 25 mg via ORAL
  Filled 2012-03-02: qty 1

## 2012-03-02 MED ORDER — EPHEDRINE SULFATE 50 MG/ML IJ SOLN
INTRAMUSCULAR | Status: DC | PRN
  Administered 2012-03-02 (×3): 5 mg via INTRAVENOUS

## 2012-03-02 MED ORDER — CEFAZOLIN SODIUM-DEXTROSE 2-3 GM-% IV SOLR
2.0000 g | Freq: Once | INTRAVENOUS | Status: AC
  Administered 2012-03-02: 2 g via INTRAVENOUS
  Filled 2012-03-02 (×2): qty 50

## 2012-03-02 MED ORDER — HYDROCHLOROTHIAZIDE 25 MG PO TABS
25.0000 mg | ORAL_TABLET | Freq: Every morning | ORAL | Status: DC
  Administered 2012-03-02 – 2012-03-03 (×2): 25 mg via ORAL
  Filled 2012-03-02 (×2): qty 1

## 2012-03-02 MED ORDER — INSULIN ASPART 100 UNIT/ML ~~LOC~~ SOLN
0.0000 [IU] | Freq: Three times a day (TID) | SUBCUTANEOUS | Status: DC
  Administered 2012-03-02 – 2012-03-03 (×2): 3 [IU] via SUBCUTANEOUS

## 2012-03-02 MED ORDER — CEFAZOLIN SODIUM 1-5 GM-% IV SOLN: 1.0000 g | INTRAVENOUS | Status: DC

## 2012-03-02 MED ORDER — INSULIN ASPART 100 UNIT/ML ~~LOC~~ SOLN
4.0000 [IU] | Freq: Three times a day (TID) | SUBCUTANEOUS | Status: DC
  Administered 2012-03-02 – 2012-03-03 (×2): 4 [IU] via SUBCUTANEOUS

## 2012-03-02 NOTE — H&P (Signed)
Agree.  Will perform fat pad biopsy once under GA and before ablation and biopsy of left kidney.

## 2012-03-02 NOTE — H&P (Signed)
Agree 

## 2012-03-02 NOTE — Preoperative (Signed)
Beta Blockers   Reason not to administer Beta Blockers:Atenolol taken at 0600 03-02-12

## 2012-03-02 NOTE — Transfer of Care (Signed)
Immediate Anesthesia Transfer of Care Note  Patient: [name]  Procedure(s) Performed: Procedure(s) (LRB) with comments: RADIO FREQUENCY ABLATION (Left) - Left Renal Cryo Ablation  Patient Location: PACU  Anesthesia Type:General  Level of Consciousness: awake, alert , oriented and patient cooperative  Airway & Oxygen Therapy: Patient Spontanous Breathing and Patient connected to face mask oxygen  Post-op Assessment: Report given to PACU RN, Post -op Vital signs reviewed and stable and Patient moving all extremities  Post vital signs: Reviewed and stable  Complications: No apparent anesthesia complications

## 2012-03-02 NOTE — Progress Notes (Signed)
Initial review for observation status is complete. 

## 2012-03-02 NOTE — Progress Notes (Signed)
Day of Surgery  Subjective: Doing well after ablation of left renal mass.  No pain.  Some right hip pain earlier.  Objective: Vital signs in last 24 hours: Temp:  [97.2 F (36.2 C)-97.8 F (36.6 C)] 97.7 F (36.5 C) (11/08 1415) Pulse Rate:  [53-77] 58  (11/08 1415) Resp:  [12-20] 18  (11/08 1415) BP: (124-167)/(62-87) 142/70 mmHg (11/08 1415) SpO2:  [94 %-100 %] 95 % (11/08 1415) Last BM Date: 03/01/12  Intake/Output from previous day:   Intake/Output this shift: Total I/O In: 1650 [I.V.:1650] Out: 400 [Urine:400]  Urine clear.  Left flank:  Nontender.  No hematoma.  Lab Results:  No results found for this basename: WBC:2,HGB:2,HCT:2,PLT:2 in the last 72 hours BMET No results found for this basename: NA:2,K:2,CL:2,CO2:2,GLUCOSE:2,BUN:2,CREATININE:2,CALCIUM:2 in the last 72 hours PT/INR No results found for this basename: LABPROT:2,INR:2 in the last 72 hours ABG No results found for this basename: PHART:2,PCO2:2,PO2:2,HCO3:2 in the last 72 hours  Studies/Results: No results found.  Anti-infectives: Anti-infectives     Start     Dose/Rate Route Frequency Ordered Stop   03/02/12 0700   ceFAZolin (ANCEF) IVPB 2 g/50 mL premix        2 g 100 mL/hr over 30 Minutes Intravenous  Once 03/02/12 0640 03/02/12 0855   03/02/12 0645   ceFAZolin (ANCEF) IVPB 1 g/50 mL premix  Status:  Discontinued        1 g 100 mL/hr over 30 Minutes Intravenous On call 03/02/12 0633 03/02/12 1610          Assessment/Plan: s/p Procedure(s) (LRB) with comments: RADIO FREQUENCY ABLATION (Left) - Left Renal Cryo Ablation  Doing well after cryoablation and biopsy of left renal mass.  Await path.  Follow up in clinic in 4 weeks.   LOS: 0 days    [name] T 03/02/2012

## 2012-03-02 NOTE — Procedures (Signed)
Procedure:  CT guided core biopsy and percutaneous cryoablation of left renal tumor Anesthesia:  General Findings:  Exophytic mass measuring approximately 3.5 cm.  Perc cryoablation with 4 separate Galil Ice Rod Plus cryo probes.   No immediate complications Plan:  PACU recovery and overnight observation

## 2012-03-02 NOTE — Anesthesia Preprocedure Evaluation (Addendum)
Anesthesia Evaluation  Patient identified by MRN, date of birth, ID band Patient awake    Reviewed: Allergy & Precautions, H&P , NPO status , Patient's Chart, lab work & pertinent test results  Airway Mallampati: I TM Distance: >3 FB Neck ROM: Full    Dental No notable dental hx. (+) Poor Dentition   Pulmonary neg pulmonary ROS, Current Smoker,  breath sounds clear to auscultation  Pulmonary exam normal       Cardiovascular hypertension, Pt. on medications + CAD and + Peripheral Vascular Disease negative cardio ROS  Rhythm:Regular Rate:Normal     Neuro/Psych negative neurological ROS  negative psych ROS   GI/Hepatic negative GI ROS, Neg liver ROS,   Endo/Other  negative endocrine ROSdiabetes, Insulin Dependent  Renal/GU negative Renal ROS  negative genitourinary   Musculoskeletal negative musculoskeletal ROS (+)   Abdominal   Peds negative pediatric ROS (+)  Hematology negative hematology ROS (+)   Anesthesia Other Findings   Reproductive/Obstetrics negative OB ROS                          Anesthesia Physical Anesthesia Plan  ASA: III  Anesthesia Plan: General   Post-op Pain Management:    Induction: Intravenous  Airway Management Planned: Oral ETT  Additional Equipment:   Intra-op Plan:   Post-operative Plan: Extubation in OR  Informed Consent: I have reviewed the patients History and Physical, chart, labs and discussed the procedure including the risks, benefits and alternatives for the proposed anesthesia with the patient or authorized representative who has indicated his/her understanding and acceptance.   Dental advisory given  Plan Discussed with: CRNA  Anesthesia Plan Comments:         Anesthesia Quick Evaluation

## 2012-03-02 NOTE — Anesthesia Postprocedure Evaluation (Signed)
  Anesthesia Post-op Note  Patient: [name]  Procedure(s) Performed: Procedure(s) (LRB): RADIO FREQUENCY ABLATION (Left)  Patient Location: PACU  Anesthesia Type: General  Level of Consciousness: awake and alert   Airway and Oxygen Therapy: Patient Spontanous Breathing  Post-op Pain: mild  Post-op Assessment: Post-op Vital signs reviewed, Patient's Cardiovascular Status Stable, Respiratory Function Stable, Patent Airway and No signs of Nausea or vomiting  Post-op Vital Signs: stable  Complications: No apparent anesthesia complications

## 2012-03-02 NOTE — H&P (Addendum)
[name] is an 78 y.o. male.   Chief Complaint: left renal mass HPI: Patient with history of CKD and enlarging medial upper left renal mass measuring approximately 3.5 cm and suspicious for renal cell carcinoma presents today for CT guided biopsy/cryoablation of the renal mass. Due to recent cardiac MRI findings c/w amyloidosis, the patient will also undergo US guided abdominal fat pad biopsy.  Past Medical History  Diagnosis Date  . Hypertension   . Anemia   . PAD (peripheral artery disease) 10/10    Angiogram  Dr Myra Gianotti , severe stenosis left profunda after insertion of aorto-bifemoral  bypass graft-total occulsionof left SFA. Collateral flow through the profunda femoral artery.  Marland Kitchen CAD in native artery 2003    by cath   . Hypercholesteremia   . Thyroid disease   . Thyroiditis, unspecified   . Osteoarthritis of spine   . Olecranon bursitis   . 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 GERD (gastroesophageal reflux disease)   . Diabetes mellitus   . History of colonic polyps   . Iron deficiency anemia, unspecified   . Leg pain   . Numbness   . History of colonoscopy   . Chronic kidney disease     Past Surgical History  Procedure Date  . Appendectomy   . Aortobifemoral bypass 2002  . Electrocardiogram 01/12/2006  . Stress cardiolite 04/14/2003  . Pr vein bypass graft,aorto-fem-pop 2002  . Cholecystectomy   . Renal artery stent 11/16/2010    Right renal artery  . Eye surgery     Family History  Problem Relation Age of Onset  . Kidney disease Sister   . Heart attack Father   . Coronary artery disease Father   . Cancer Mother    Social History:  reports that he has been smoking Cigarettes.  He has a 60 pack-year smoking history. He has never used smokeless tobacco. He reports that he does not drink alcohol or use illicit drugs.  Allergies:  Allergies  Allergen Reactions  . Clopidogrel Bisulfate Hives    Medications Prior to Admission    Medication Sig Dispense Refill  . atenolol (TENORMIN) 25 MG tablet Take 25 mg by mouth every morning.      . calcitRIOL (ROCALTROL) 0.5 MCG capsule Take 0.5 mcg by mouth daily.       Marland Kitchen dipyridamole-aspirin (AGGRENOX) 200-25 MG per 12 hr capsule Take 1 capsule by mouth 2 (two) times daily.      Marland Kitchen ezetimibe-simvastatin (VYTORIN) 10-40 MG per tablet Take 1 tablet by mouth every morning.      . ferrous sulfate 325 (65 FE) MG tablet Take 325 mg by mouth 3 (three) times a week.      . hydrochlorothiazide (HYDRODIURIL) 25 MG tablet Take 25 mg by mouth every morning.      Marland Kitchen HYDROcodone-acetaminophen (NORCO) 10-325 MG per tablet Take 1 tablet by mouth every 4 (four) hours as needed. For pain      . insulin lispro protamine-insulin lispro (HUMALOG 75/25) (75-25) 100 UNIT/ML SUSP Inject 11-16 Units into the skin 2 (two) times daily with a meal. Inject into the skin 2 (two) times daily with a meal, 16 units with breakfast, and 11 units with the evening meal.      . OVER THE COUNTER MEDICATION Apply 1 application topically daily as needed. Apply to painful areas around knees      . Vitamin D, Ergocalciferol, (DRISDOL) 50000 UNITS CAPS Take 50,000 Units by mouth 2 (  two) times a week. On Tuesday and Saturday      . sildenafil (VIAGRA) 100 MG tablet Take 100 mg by mouth daily as needed.        Results for orders placed during the hospital encounter of 03/02/12 (from the past 48 hour(s))  GLUCOSE, CAPILLARY     Status: Abnormal   Collection Time   03/02/12  7:10 AM      Component Value Range Comment   Glucose-Capillary 135 (*) 70 - 99 mg/dL    Comment 1 Documented in Chart      Results for orders placed during the hospital encounter of 03/02/12  GLUCOSE, CAPILLARY      Component Value Range   Glucose-Capillary 135 (*) 70 - 99 mg/dL   Comment 1 Documented in Chart     Labs  02/28/2012    WBC 6.8  HGB 12.6  PLTS 169K   BUN  29  CREAT 1.67   PT 14.6  INR 1.16   PTT 43 Review of Systems  Constitutional:  Negative for fever and chills.  Respiratory: Negative for cough and shortness of breath.   Cardiovascular: Negative for chest pain.  Gastrointestinal: Negative for nausea, vomiting and abdominal pain.  Musculoskeletal: Positive for back pain.  Neurological: Negative for headaches.       Intermittent rt hand paresthesias  Endo/Heme/Allergies: Does not bruise/bleed easily.    Blood pressure 167/87, pulse 77, temperature 97.8 F (36.6 C), resp. rate 20, SpO2 100.00%. Physical Exam  Constitutional: He is oriented to person, place, and time. He appears well-developed and well-nourished.  Cardiovascular: Normal rate and regular rhythm.   Murmur heard. Respiratory: Effort normal.       Sl dim BS bases with few crackles  GI: Soft. Bowel sounds are normal. There is no tenderness.  Musculoskeletal: Normal range of motion. He exhibits no edema.  Neurological: He is alert and oriented to person, place, and time.     Assessment/Plan Patient with enlarging mass medial left upper kidney. Plan is for CT guided biopsy/cryoablation of the renal mass today under general anesthesia followed by overnight observation for hemodynamic monitoring. In addition , the patient is scheduled for US guided abdominal fat pad biopsy to r/o amyloidosis. Details/risks of procedures d/w pt/son with their understanding and consent.  ALLRED,D KEVIN 03/02/2012, 8:00 AM

## 2012-03-03 ENCOUNTER — Other Ambulatory Visit: Payer: Self-pay | Admitting: Radiology

## 2012-03-03 DIAGNOSIS — N2889 Other specified disorders of kidney and ureter: Secondary | ICD-10-CM

## 2012-03-03 LAB — BASIC METABOLIC PANEL
BUN: 36 mg/dL — ABNORMAL HIGH (ref 6–23)
CO2: 21 mEq/L (ref 19–32)
Calcium: 9 mg/dL (ref 8.4–10.5)
Creatinine, Ser: 1.95 mg/dL — ABNORMAL HIGH (ref 0.50–1.35)
Glucose, Bld: 170 mg/dL — ABNORMAL HIGH (ref 70–99)

## 2012-03-03 LAB — CBC
MCH: 26.5 pg (ref 26.0–34.0)
MCHC: 30.6 g/dL (ref 30.0–36.0)
MCV: 86.5 fL (ref 78.0–100.0)
Platelets: 127 10*3/uL — ABNORMAL LOW (ref 150–400)
RDW: 19.2 % — ABNORMAL HIGH (ref 11.5–15.5)

## 2012-03-03 NOTE — Discharge Summary (Signed)
Agree.  Follow up in 4 weeks. 

## 2012-03-03 NOTE — Discharge Summary (Signed)
Physician Discharge Summary  Patient ID: EDILSON VITAL MRN: [id number] DOB/AGE: 1930-08-14 78 y.o.  Admit date: 03/02/2012 Discharge date: 03/03/2012  Admission Diagnoses: Principal Problem:  *Renal mass, left  Discharge Diagnoses:  Principal Problem:  *Renal mass, left    Procedures: Procedure(s): CT guided Cryoablation (L)renal mass  Discharged Condition: good  Hospital Course: HPI: Patient with history of CKD and enlarging medial upper left renal mass measuring approximately 3.5 cm and suspicious for renal cell carcinoma presents today for CT guided biopsy/cryoablation of the renal mass. Due to recent cardiac MRI findings c/w amyloidosis, the patient will also undergo US guided abdominal fat pad biopsy.  Pt brought to CT dept and underwent successful (L)renal mass cryoablation treatment and additional abdominal fat pad biopsy. No immediate complications. Admitted to floor in stable condition. NO overnight events. Tol regular diet. No N/V. Voiding well. No fevers, CP, SOB. Labs reviewed. Cr remaining at baseline. Hgb slight drop to 10.8, likely dilutional from IV hydration. Pt remained hemodynamically stable. We feel pt is stable for discharge. Have reviewed discharge instructions and meds. He will have follow up arranged in 4 weeks.   Consults: None   Discharge Exam: Blood pressure 141/82, pulse 76, temperature 97.2 F (36.2 C), temperature source Oral, resp. rate 16, height 6\' 2"  (1.88 m), weight 145 lb (65.772 kg), SpO2 100.00%. Lungs: CTA without w/r/r Heart: Regular Abdomen: soft, NT, Fat pad biopsy site clean, no hematoma, small ecchymosis. Back: perc site clean, no hematoma, NT    Disposition: 01-Home or Self Care   Future Orders Please Complete By Expires   Diet - low sodium heart healthy      Increase activity slowly      May shower / Bathe      No dressing needed      Call MD for:  difficulty breathing, headache or visual disturbances      Call MD for:   redness, tenderness, or signs of infection (pain, swelling, redness, odor or green/yellow discharge around incision site)      Call MD for:  severe uncontrolled pain      Call MD for:  persistant nausea and vomiting      Call MD for:  temperature >100.4          Medication List     As of 03/03/2012 10:11 AM    TAKE these medications         atenolol 25 MG tablet   Commonly known as: TENORMIN   Take 25 mg by mouth every morning.      calcitRIOL 0.5 MCG capsule   Commonly known as: ROCALTROL   Take 0.5 mcg by mouth daily.      dipyridamole-aspirin 200-25 MG per 12 hr capsule   Commonly known as: AGGRENOX   Take 1 capsule by mouth 2 (two) times daily.      ezetimibe-simvastatin 10-40 MG per tablet   Commonly known as: VYTORIN   Take 1 tablet by mouth every morning.      ferrous sulfate 325 (65 FE) MG tablet   Take 325 mg by mouth 3 (three) times a week.      hydrochlorothiazide 25 MG tablet   Commonly known as: HYDRODIURIL   Take 25 mg by mouth every morning.      HYDROcodone-acetaminophen 10-325 MG per tablet   Commonly known as: NORCO   Take 1 tablet by mouth every 4 (four) hours as needed. For pain      insulin lispro protamine-insulin lispro (75-25)  100 UNIT/ML Susp   Commonly known as: HUMALOG 75/25   Inject 11-16 Units into the skin 2 (two) times daily with a meal. Inject into the skin 2 (two) times daily with a meal, 16 units with breakfast, and 11 units with the evening meal.      OVER THE COUNTER MEDICATION   Apply 1 application topically daily as needed. Apply to painful areas around knees      sildenafil 100 MG tablet   Commonly known as: VIAGRA   Take 100 mg by mouth daily as needed.      Vitamin D (Ergocalciferol) 50000 UNITS Caps   Commonly known as: DRISDOL   Take 50,000 Units by mouth 2 (two) times a week. On Tuesday and Saturday        Follow-up Information    Follow up with [facility], MD. In 4 weeks. (Office will call)    Contact  information:   [facility] [address] [phone]          Signed: Brayton El PA-C 03/03/2012, 10:11 AM

## 2012-03-03 NOTE — Progress Notes (Signed)
BP 141/82  Pulse 76  Temp 97.2 F (36.2 C) (Oral)  Resp 16  Ht 6\' 2"  (1.88 m)  Wt 145 lb (65.772 kg)  BMI 18.62 kg/m2  SpO2 100% Pt feels ok. No new c/o Tol reg diet, no N/V Voiding well, no hematuria Lungs: CTA Heart: reg Abd: soft, NT, fat pad biopsy site clean, small amt ecchymosis, no hematoma Back: site clean, no hematoma or tenderness.  Labs:  CBC    Component Value Date/Time   WBC 9.7 03/03/2012 0452   RBC 4.08* 03/03/2012 0452   HGB 10.8* 03/03/2012 0452   HCT 35.3* 03/03/2012 0452   PLT 127* 03/03/2012 0452   MCV 86.5 03/03/2012 0452   MCH 26.5 03/03/2012 0452   MCHC 30.6 03/03/2012 0452   RDW 19.2* 03/03/2012 0452   LYMPHSABS 1.2 08/04/2011 1015   MONOABS 0.6 08/04/2011 1015   EOSABS 0.2 08/04/2011 1015   BASOSABS 0.1 08/04/2011 1015    BMET    Component Value Date/Time   NA 135 03/03/2012 0452   K 4.9 03/03/2012 0452   CL 100 03/03/2012 0452   CO2 21 03/03/2012 0452   GLUCOSE 170* 03/03/2012 0452   BUN 36* 03/03/2012 0452   CREATININE 1.95* 03/03/2012 0452   CALCIUM 9.0 03/03/2012 0452   CALCIUM 9.0 06/29/2010 2210   GFRNONAA 31* 03/03/2012 0452   GFRAA 36* 03/03/2012 0452   S/p (L)renal mass cryo S/p abd fat pad biopsy Stable for discharge  Brayton El PA-C 03/03/2012 10:07 AM

## 2012-03-03 NOTE — Progress Notes (Signed)
Patient has c/o of pain in left leg radiating down to foot 2x tonight. Both times pain has awaken him. "Shooting pain" and left foot becomes "numb". Relieved with massaging, heat, and prn vicodin. Will continue to monitor.

## 2012-03-03 NOTE — Progress Notes (Signed)
Patient discharged home, all discharge medications and instructions reviewed and questions answered.  Patient assisted to vehicle by wheelchair. 

## 2012-03-05 ENCOUNTER — Telehealth: Payer: Self-pay | Admitting: Emergency Medicine

## 2012-03-05 ENCOUNTER — Other Ambulatory Visit: Payer: Self-pay | Admitting: Endocrinology

## 2012-03-05 ENCOUNTER — Telehealth: Payer: Self-pay

## 2012-03-05 DIAGNOSIS — R200 Anesthesia of skin: Secondary | ICD-10-CM

## 2012-03-05 DIAGNOSIS — I739 Peripheral vascular disease, unspecified: Secondary | ICD-10-CM

## 2012-03-05 LAB — GLUCOSE, CAPILLARY

## 2012-03-05 MED ORDER — HYDROCODONE-ACETAMINOPHEN 10-325 MG PO TABS: 1.0000 | ORAL_TABLET | ORAL | Status: DC | PRN

## 2012-03-05 NOTE — Telephone Encounter (Signed)
Message copied by Phillips Odor on Mon Mar 05, 2012 12:40 PM ------      Message from: Marcellus Scott      Created: Mon Mar 05, 2012  9:44 AM      Contact: [phone]       Gerrit said he had surgery on his kidney on Friday.Shortly after he said he has no circulation in foot and it is numb and real cold.            Please call            Thanks      Revonda Standard

## 2012-03-05 NOTE — Telephone Encounter (Signed)
Pt. called to report having had procedure on 03/02/12, and states since Friday, 11/8, his "left foot feels numb, and left foot, and up to knee feels cooler than the right leg."   Also describes that left foot feels tight, but denies any swelling of left foot/leg.  Also states "it feels like I am walking on a pad or something."   Discussed w/ Dr. Hart Rochester.  Advised that the symptoms pt. is experiencing are probably unrelated to his CT-guided Renal biopsy / cryoablation of left renal mass on 03/02/12.  Advised to schedule pt. for ABI's and to see the nurse practitioner.  Offered pt. appt. this afternoon.  Stated he could not come to office today, due to his personal situation at home, with caring for grandchildren.   Advised will contact him with appt. for  tomorrow, and to go to the Emergency Room tonight if his symptoms worsen.  Verb. Understanding/ agrees w/ plan.

## 2012-03-05 NOTE — Telephone Encounter (Signed)
PT. CALLED W/ C/C OF SEVERE PAIN FROM THIGH TO KNEE AND THEN NUMBNESS FROM KNEE TO FOOT ON LEFT SIDE.  HAS EXPERIENCED THIS SINCE D/C.  PAIN MEDS HELP SOME.  ALSO HAS NOT HAD A BOWEL MOVEMENT.  ASKED IF HE HAS A STOOL SOFTNER AT HOME TO TAKE- WIFE HAS MILK O MAG.  PAGED KEVIN B, PA-C AT 9:16AM  9:23AM- KEVIN TO CONTACT PT.

## 2012-03-05 NOTE — Progress Notes (Signed)
Called pt after receiving message that he developed some (L)leg pain, tingling, and coldness of his foot over the past few days. He just had (L)renal mass cryo and was discharged on 11/9/ He was doing well at time of dc. Denies back pain, hematuria Main c/o is left leg hurts mostly from knee down to foot and that left foot feels cold, numb. He has hx of PAD, followed by [facility]  Also c/o constipation, recommended MOM or Miralax as well as stool softeners.  Recommended pt call [facility] as this sounds more like his PAD. Does not sound like sxs related to recent renal cryo procedure.  Brayton El PA-C 9:36 AM

## 2012-03-06 ENCOUNTER — Encounter: Payer: Self-pay | Admitting: Neurosurgery

## 2012-03-06 ENCOUNTER — Encounter (HOSPITAL_COMMUNITY): Payer: Self-pay | Admitting: Pharmacy Technician

## 2012-03-06 ENCOUNTER — Ambulatory Visit (INDEPENDENT_AMBULATORY_CARE_PROVIDER_SITE_OTHER): Payer: Medicare Other | Admitting: Neurosurgery

## 2012-03-06 ENCOUNTER — Other Ambulatory Visit: Payer: Self-pay | Admitting: *Deleted

## 2012-03-06 ENCOUNTER — Ambulatory Visit (INDEPENDENT_AMBULATORY_CARE_PROVIDER_SITE_OTHER): Payer: Medicare Other | Admitting: Vascular Surgery

## 2012-03-06 VITALS — BP 134/74 | HR 66 | Resp 16 | Ht 74.0 in | Wt 147.0 lb

## 2012-03-06 DIAGNOSIS — R209 Unspecified disturbances of skin sensation: Secondary | ICD-10-CM

## 2012-03-06 DIAGNOSIS — R200 Anesthesia of skin: Secondary | ICD-10-CM

## 2012-03-06 DIAGNOSIS — I739 Peripheral vascular disease, unspecified: Secondary | ICD-10-CM

## 2012-03-06 DIAGNOSIS — R6889 Other general symptoms and signs: Secondary | ICD-10-CM

## 2012-03-06 DIAGNOSIS — R231 Pallor: Secondary | ICD-10-CM | POA: Insufficient documentation

## 2012-03-06 NOTE — Progress Notes (Signed)
[facility] Office Note  CC: Cold and painful left lower extremity Referring Physician: Lawson  History of Present Illness: 78-year-old male patient of Dr. Lawson is followed status post a previous aortobifem bypass graft in 2002. The patient states he did have a cryoablation of a kidney lesion Friday and this past Saturday the 9th of Nov, he developed coldness and pain in his left lower extremity with decreased sensation. The patient called asking to be evaluated we brought him in for such.  Past Medical History  Diagnosis Date  . Hypertension   . Anemia   . PAD (peripheral artery disease) 10/10    Angiogram  Dr Brabham , severe stenosis left profunda after insertion of aorto-bifemoral  bypass graft-total occulsionof left SFA. Collateral flow through the profunda femoral artery.  . CAD in native artery 2003    by cath   . Hypercholesteremia   . Thyroid disease   . Thyroiditis, unspecified   . Osteoarthritis of spine   . Olecranon bursitis   . 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 GERD (gastroesophageal reflux disease)   . Diabetes mellitus   . History of colonic polyps   . Iron deficiency anemia, unspecified   . Leg pain   . Numbness   . History of colonoscopy   . Chronic kidney disease     ROS: [x] Positive   [ ] Denies    General: [ ] Weight loss, [ ] Fever, [ ] chills Neurologic: [ ] Dizziness, [ ] Blackouts, [ ] Seizure [ ] Stroke, [ ] "Mini stroke", [ ] Slurred speech, [ ] Temporary blindness; [ ] weakness in arms or legs, [ ] Hoarseness Cardiac: [ ] Chest pain/pressure, [ ] Shortness of breath at rest [ ] Shortness of breath with exertion, [ ] Atrial fibrillation or irregular heartbeat Vascular: [ ] Pain in legs with walking, [ ] Pain in legs at rest, [ ] Pain in legs at night,  [ ] Non-healing ulcer, [ ] Blood clot in vein/DVT,   Pulmonary: [ ] Home oxygen, [ ] Productive cough, [ ] Coughing up blood, [ ]  Asthma,  [ ] Wheezing Musculoskeletal:  [ ] Arthritis, [ ] Low back pain, [ ] Joint pain Hematologic: [ ] Easy Bruising, [ ] Anemia; [ ] Hepatitis Gastrointestinal: [ ] Blood in stool, [ ] Gastroesophageal Reflux/heartburn, [ ] Trouble swallowing Urinary: [ ] chronic Kidney disease, [ ] on HD - [ ] MWF or [ ] TTHS, [ ] Burning with urination, [ ] Difficulty urinating Skin: [ ] Rashes, [ ] Wounds Psychological: [ ] Anxiety, [ ] Depression   Social History History  Substance Use Topics  . Smoking status: Current Every Day Smoker -- 1.0 packs/day for 60 years    Types: Cigarettes  . Smokeless tobacco: Never Used  . Alcohol Use: No    Family History Family History  Problem Relation Age of Onset  . Kidney disease Sister   . Heart attack Father   . Coronary artery disease Father   . Cancer Mother     Allergies  Allergen Reactions  . Clopidogrel Bisulfate Hives    Current Outpatient Prescriptions  Medication Sig Dispense Refill  . atenolol (TENORMIN) 25 MG tablet Take 25 mg by mouth every morning.      . calcitRIOL (ROCALTROL) 0.5 MCG capsule Take 0.5 mcg by mouth daily.       . dipyridamole-aspirin (AGGRENOX) 200-25 MG per   12 hr capsule Take 1 capsule by mouth 2 (two) times daily.      . ezetimibe-simvastatin (VYTORIN) 10-40 MG per tablet Take 1 tablet by mouth every morning.      . ferrous sulfate 325 (65 FE) MG tablet Take 325 mg by mouth 3 (three) times a week.      . hydrochlorothiazide (HYDRODIURIL) 25 MG tablet Take 25 mg by mouth every morning.      . HYDROcodone-acetaminophen (NORCO) 10-325 MG per tablet Take 1 tablet by mouth every 4 (four) hours as needed. For pain  100 tablet  2  . insulin lispro protamine-insulin lispro (HUMALOG 75/25) (75-25) 100 UNIT/ML SUSP Inject 11-16 Units into the skin 2 (two) times daily with a meal. Inject into the skin 2 (two) times daily with a meal, 16 units with breakfast, and 11 units with the evening meal.      . OVER THE COUNTER  MEDICATION Apply 1 application topically daily as needed. Apply to painful areas around knees      . sildenafil (VIAGRA) 100 MG tablet Take 100 mg by mouth daily as needed.      . Vitamin D, Ergocalciferol, (DRISDOL) 50000 UNITS CAPS Take 50,000 Units by mouth 2 (two) times a week. On Tuesday and Saturday        Physical Examination  Filed Vitals:   03/06/12 1427  BP: 134/74  Pulse: 66  Resp: 16    Body mass index is 18.87 kg/(m^2).  General:  WDWN in NAD Gait: Normal HEENT: WNL Eyes: Pupils equal Pulmonary: normal non-labored breathing , without Rales, rhonchi,  wheezing Cardiac: RRR, without  Murmurs, rubs or gallops; No carotid bruits Abdomen: soft, NT, no masses Skin: no rashes, ulcers noted Vascular Exam/Pulses: Dr. Lawson examined the patient as well as I, he does have a palpable right femoral pulse with no femoral pulse on the left, he can dorsiflex and plantarflex with 4/5 strength on the left, there is no pain in his calf or foot with palpation and he does have sensation in his left lower extremity it is just somewhat diminished to touch. There is no discoloration, no open ulcerations and the patient is able to ambulate but is currently using a wheelchair because he has diminished sensation and is afraid of a fall.  Extremities without ischemic changes, no Gangrene , no cellulitis; no open wounds;  Musculoskeletal: no muscle wasting or atrophy  Neurologic: A&O X 3; Appropriate Affect ; SENSATION: normal; MOTOR FUNCTION:  moving all extremities equally. Speech is fluent/normal  Non-Invasive Vascular Imaging: ABIs repeated today showing an ABI on the right of 0.78, 0 on the left. This is diminished from his previous ABIs from 3 weeks ago  ASSESSMENT/PLAN: Dr. Lawson spoke with the patient at length the plan will be for the patient to have surgery on Thursday, November 14 for a possible right to left fem-fem bypass and attempted left lower extremity thrombectomy with the  possibility of a left femoropopliteal bypass however Dr. Lawson will not to the extent of the procedure until he has had time in the OR to evaluate the patient's situation. The patient understands this, he is in agreement his followup will be pending his hospitalization stay and recovery.  Shasta Chinn ANP  Clinic M.D.: Lawson  

## 2012-03-06 NOTE — Progress Notes (Signed)
Ankle brachial index performed @ [facility] 03/06/2012

## 2012-03-07 ENCOUNTER — Encounter (HOSPITAL_COMMUNITY): Payer: Self-pay | Admitting: *Deleted

## 2012-03-07 MED ORDER — DEXTROSE 5 % IV SOLN
1.5000 g | INTRAVENOUS | Status: AC
  Administered 2012-03-08: 1.5 g via INTRAVENOUS
  Filled 2012-03-07: qty 1.5

## 2012-03-07 NOTE — Progress Notes (Signed)
I notified [name] that Mr Prigge did not have a PAT appointment and that OR is scheduled for 0715.  [name] said that surgery is at 0730.

## 2012-03-08 ENCOUNTER — Encounter (HOSPITAL_COMMUNITY): Payer: Self-pay | Admitting: Anesthesiology

## 2012-03-08 ENCOUNTER — Encounter (HOSPITAL_COMMUNITY): Payer: Self-pay | Admitting: Surgery

## 2012-03-08 ENCOUNTER — Encounter (HOSPITAL_COMMUNITY)
Admission: RE | Disposition: A | Discharge status: Home / Self Care | Payer: Self-pay | Source: Ambulatory Visit | Attending: Vascular Surgery

## 2012-03-08 ENCOUNTER — Inpatient Hospital Stay (HOSPITAL_COMMUNITY): Payer: Medicare Other | Admitting: Anesthesiology

## 2012-03-08 ENCOUNTER — Inpatient Hospital Stay (HOSPITAL_COMMUNITY)
Admission: RE | Admit: 2012-03-08 | Discharge: 2012-03-10 | DRG: 254 | Disposition: A | Discharge status: Home / Self Care | Payer: Medicare Other | Source: Ambulatory Visit | Attending: Vascular Surgery | Admitting: Vascular Surgery

## 2012-03-08 DIAGNOSIS — I739 Peripheral vascular disease, unspecified: Secondary | ICD-10-CM

## 2012-03-08 DIAGNOSIS — M479 Spondylosis, unspecified: Secondary | ICD-10-CM | POA: Diagnosis present

## 2012-03-08 DIAGNOSIS — T82898A Other specified complication of vascular prosthetic devices, implants and grafts, initial encounter: Secondary | ICD-10-CM

## 2012-03-08 DIAGNOSIS — N189 Chronic kidney disease, unspecified: Secondary | ICD-10-CM | POA: Diagnosis present

## 2012-03-08 DIAGNOSIS — E109 Type 1 diabetes mellitus without complications: Secondary | ICD-10-CM | POA: Diagnosis present

## 2012-03-08 DIAGNOSIS — I129 Hypertensive chronic kidney disease with stage 1 through stage 4 chronic kidney disease, or unspecified chronic kidney disease: Secondary | ICD-10-CM | POA: Diagnosis present

## 2012-03-08 DIAGNOSIS — I743 Embolism and thrombosis of arteries of the lower extremities: Secondary | ICD-10-CM

## 2012-03-08 DIAGNOSIS — I70409 Unspecified atherosclerosis of autologous vein bypass graft(s) of the extremities, unspecified extremity: Principal | ICD-10-CM | POA: Diagnosis present

## 2012-03-08 DIAGNOSIS — I2510 Atherosclerotic heart disease of native coronary artery without angina pectoris: Secondary | ICD-10-CM | POA: Diagnosis present

## 2012-03-08 HISTORY — DX: Personal history of other medical treatment: Z92.89

## 2012-03-08 HISTORY — PX: PATCH ANGIOPLASTY: SHX6230

## 2012-03-08 HISTORY — PX: EMBOLECTOMY: SHX44

## 2012-03-08 LAB — COMPREHENSIVE METABOLIC PANEL
Albumin: 3.5 g/dL (ref 3.5–5.2)
BUN: 25 mg/dL — ABNORMAL HIGH (ref 6–23)
Creatinine, Ser: 1.59 mg/dL — ABNORMAL HIGH (ref 0.50–1.35)
GFR calc Af Amer: 46 mL/min — ABNORMAL LOW (ref >90)
Glucose, Bld: 157 mg/dL — ABNORMAL HIGH (ref 70–99)
Total Protein: 7.1 g/dL (ref 6.0–8.3)

## 2012-03-08 LAB — CBC
HCT: 38.1 % — ABNORMAL LOW (ref 39.0–52.0)
Hemoglobin: 12.2 g/dL — ABNORMAL LOW (ref 13.0–17.0)
MCH: 27.5 pg (ref 26.0–34.0)
MCHC: 32 g/dL (ref 30.0–36.0)
MCV: 86 fL (ref 78.0–100.0)
RDW: 18 % — ABNORMAL HIGH (ref 11.5–15.5)

## 2012-03-08 LAB — TYPE AND SCREEN
ABO/RH(D): B POS
Antibody Screen: NEGATIVE

## 2012-03-08 LAB — GLUCOSE, CAPILLARY
Glucose-Capillary: 125 mg/dL — ABNORMAL HIGH (ref 70–99)
Glucose-Capillary: 137 mg/dL — ABNORMAL HIGH (ref 70–99)
Glucose-Capillary: 151 mg/dL — ABNORMAL HIGH (ref 70–99)
Glucose-Capillary: 152 mg/dL — ABNORMAL HIGH (ref 70–99)

## 2012-03-08 LAB — SURGICAL PCR SCREEN
MRSA, PCR: NEGATIVE
Staphylococcus aureus: NEGATIVE

## 2012-03-08 SURGERY — EMBOLECTOMY
Anesthesia: General | Site: Leg Upper | Laterality: Left | Wound class: Clean

## 2012-03-08 MED ORDER — ATENOLOL 25 MG PO TABS
25.0000 mg | ORAL_TABLET | Freq: Every morning | ORAL | Status: DC
  Administered 2012-03-09: 25 mg via ORAL
  Filled 2012-03-08 (×2): qty 1

## 2012-03-08 MED ORDER — INSULIN ASPART 100 UNIT/ML ~~LOC~~ SOLN
0.0000 [IU] | SUBCUTANEOUS | Status: DC
  Administered 2012-03-08 – 2012-03-09 (×4): 2 [IU] via SUBCUTANEOUS
  Administered 2012-03-09: 1 [IU] via SUBCUTANEOUS
  Administered 2012-03-09: 2 [IU] via SUBCUTANEOUS
  Administered 2012-03-09 – 2012-03-10 (×2): 1 [IU] via SUBCUTANEOUS

## 2012-03-08 MED ORDER — POTASSIUM CHLORIDE CRYS ER 20 MEQ PO TBCR: 20.0000 meq | EXTENDED_RELEASE_TABLET | Freq: Once | ORAL | Status: AC | PRN

## 2012-03-08 MED ORDER — SENNOSIDES-DOCUSATE SODIUM 8.6-50 MG PO TABS
1.0000 | ORAL_TABLET | Freq: Every evening | ORAL | Status: DC | PRN
  Filled 2012-03-08: qty 1

## 2012-03-08 MED ORDER — LABETALOL HCL 5 MG/ML IV SOLN
10.0000 mg | INTRAVENOUS | Status: DC | PRN
  Filled 2012-03-08: qty 4

## 2012-03-08 MED ORDER — PHENOL 1.4 % MT LIQD: 1.0000 | OROMUCOSAL | Status: DC | PRN

## 2012-03-08 MED ORDER — ATENOLOL 25 MG PO TABS
25.0000 mg | ORAL_TABLET | Freq: Once | ORAL | Status: AC
  Administered 2012-03-08: 25 mg via ORAL
  Filled 2012-03-08: qty 1

## 2012-03-08 MED ORDER — GUAIFENESIN-DM 100-10 MG/5ML PO SYRP: 15.0000 mL | ORAL_SOLUTION | ORAL | Status: DC | PRN

## 2012-03-08 MED ORDER — ONDANSETRON HCL 4 MG/2ML IJ SOLN: 4.0000 mg | Freq: Once | INTRAMUSCULAR | Status: DC | PRN

## 2012-03-08 MED ORDER — HYDROMORPHONE HCL PF 1 MG/ML IJ SOLN: 0.2500 mg | INTRAMUSCULAR | Status: DC | PRN

## 2012-03-08 MED ORDER — HYDROCHLOROTHIAZIDE 25 MG PO TABS
25.0000 mg | ORAL_TABLET | Freq: Every morning | ORAL | Status: DC
Start: 2012-03-08 — End: 2012-03-10
  Administered 2012-03-08 – 2012-03-09 (×2): 25 mg via ORAL
  Filled 2012-03-08 (×3): qty 1

## 2012-03-08 MED ORDER — ASPIRIN-DIPYRIDAMOLE ER 25-200 MG PO CP12
1.0000 | ORAL_CAPSULE | Freq: Two times a day (BID) | ORAL | Status: DC
  Administered 2012-03-08 – 2012-03-09 (×4): 1 via ORAL
  Filled 2012-03-08 (×6): qty 1

## 2012-03-08 MED ORDER — HEPARIN SODIUM (PORCINE) 1000 UNIT/ML IJ SOLN
INTRAMUSCULAR | Status: DC | PRN
  Administered 2012-03-08: 6000 [IU] via INTRAVENOUS

## 2012-03-08 MED ORDER — SODIUM CHLORIDE 0.9 % IR SOLN
Status: DC | PRN
  Administered 2012-03-08: 08:00:00

## 2012-03-08 MED ORDER — NEOSTIGMINE METHYLSULFATE 1 MG/ML IJ SOLN
INTRAMUSCULAR | Status: DC | PRN
  Administered 2012-03-08: 3 mg via INTRAVENOUS

## 2012-03-08 MED ORDER — OXYCODONE-ACETAMINOPHEN 5-325 MG PO TABS
1.0000 | ORAL_TABLET | ORAL | Status: DC | PRN
  Administered 2012-03-08 (×2): 1 via ORAL
  Administered 2012-03-09 – 2012-03-10 (×5): 2 via ORAL
  Filled 2012-03-08 (×2): qty 2
  Filled 2012-03-08 (×2): qty 1
  Filled 2012-03-08 (×3): qty 2

## 2012-03-08 MED ORDER — SODIUM CHLORIDE 0.9 % IV SOLN
INTRAVENOUS | Status: DC | PRN
  Administered 2012-03-08 (×2): via INTRAVENOUS

## 2012-03-08 MED ORDER — CALCITRIOL 0.5 MCG PO CAPS
0.5000 ug | ORAL_CAPSULE | Freq: Every day | ORAL | Status: DC
  Administered 2012-03-08 – 2012-03-09 (×2): 0.5 ug via ORAL
  Filled 2012-03-08 (×3): qty 1

## 2012-03-08 MED ORDER — MUPIROCIN 2 % EX OINT
TOPICAL_OINTMENT | CUTANEOUS | Status: AC
  Administered 2012-03-08: 1 via NASAL
  Filled 2012-03-08: qty 22

## 2012-03-08 MED ORDER — MAGNESIUM SULFATE 40 MG/ML IJ SOLN
2.0000 g | Freq: Once | INTRAMUSCULAR | Status: AC | PRN
  Filled 2012-03-08: qty 50

## 2012-03-08 MED ORDER — MUPIROCIN 2 % EX OINT
TOPICAL_OINTMENT | Freq: Two times a day (BID) | CUTANEOUS | Status: DC
  Administered 2012-03-08: 13:00:00 via NASAL
  Administered 2012-03-08: 1 via NASAL
  Administered 2012-03-08 – 2012-03-09 (×3): via NASAL

## 2012-03-08 MED ORDER — ONDANSETRON HCL 4 MG/2ML IJ SOLN: 4.0000 mg | Freq: Four times a day (QID) | INTRAMUSCULAR | Status: DC | PRN

## 2012-03-08 MED ORDER — FENTANYL CITRATE 0.05 MG/ML IJ SOLN
INTRAMUSCULAR | Status: DC | PRN
  Administered 2012-03-08 (×3): 50 ug via INTRAVENOUS

## 2012-03-08 MED ORDER — ONDANSETRON HCL 4 MG/2ML IJ SOLN
INTRAMUSCULAR | Status: DC | PRN
  Administered 2012-03-08: 4 mg via INTRAVENOUS

## 2012-03-08 MED ORDER — DEXTROSE 5 % IV SOLN
1.5000 g | Freq: Two times a day (BID) | INTRAVENOUS | Status: AC
  Administered 2012-03-08 – 2012-03-09 (×2): 1.5 g via INTRAVENOUS
  Filled 2012-03-08 (×2): qty 1.5

## 2012-03-08 MED ORDER — PROTAMINE SULFATE 10 MG/ML IV SOLN
INTRAVENOUS | Status: DC | PRN
  Administered 2012-03-08 (×3): 10 mg via INTRAVENOUS
  Administered 2012-03-08: 20 mg via INTRAVENOUS

## 2012-03-08 MED ORDER — SODIUM CHLORIDE 0.9 % IV SOLN
INTRAVENOUS | Status: DC
  Administered 2012-03-08: 12:00:00 via INTRAVENOUS

## 2012-03-08 MED ORDER — ALBUMIN HUMAN 5 % IV SOLN
INTRAVENOUS | Status: DC | PRN
  Administered 2012-03-08: 09:00:00 via INTRAVENOUS

## 2012-03-08 MED ORDER — PANTOPRAZOLE SODIUM 40 MG PO TBEC
40.0000 mg | DELAYED_RELEASE_TABLET | Freq: Every day | ORAL | Status: DC
  Administered 2012-03-08: 40 mg via ORAL
  Filled 2012-03-08: qty 1

## 2012-03-08 MED ORDER — LIDOCAINE HCL (CARDIAC) 20 MG/ML IV SOLN
INTRAVENOUS | Status: DC | PRN
  Administered 2012-03-08: 100 mg via INTRAVENOUS

## 2012-03-08 MED ORDER — ACETAMINOPHEN 325 MG PO TABS: 325.0000 mg | ORAL_TABLET | ORAL | Status: DC | PRN

## 2012-03-08 MED ORDER — LACTATED RINGERS IV SOLN
INTRAVENOUS | Status: DC | PRN
  Administered 2012-03-08: 07:00:00 via INTRAVENOUS

## 2012-03-08 MED ORDER — FERROUS SULFATE 325 (65 FE) MG PO TABS
325.0000 mg | ORAL_TABLET | ORAL | Status: DC
  Administered 2012-03-09: 325 mg via ORAL
  Filled 2012-03-08 (×2): qty 1

## 2012-03-08 MED ORDER — ALUM & MAG HYDROXIDE-SIMETH 200-200-20 MG/5ML PO SUSP: 15.0000 mL | ORAL | Status: DC | PRN

## 2012-03-08 MED ORDER — SODIUM CHLORIDE 0.9 % IV SOLN: INTRAVENOUS | Status: DC

## 2012-03-08 MED ORDER — GLYCOPYRROLATE 0.2 MG/ML IJ SOLN
INTRAMUSCULAR | Status: DC | PRN
  Administered 2012-03-08: 0.4 mg via INTRAVENOUS

## 2012-03-08 MED ORDER — DOPAMINE-DEXTROSE 3.2-5 MG/ML-% IV SOLN
3.0000 ug/kg/min | INTRAVENOUS | Status: DC
  Filled 2012-03-08: qty 250

## 2012-03-08 MED ORDER — METOPROLOL TARTRATE 1 MG/ML IV SOLN: 2.0000 mg | INTRAVENOUS | Status: DC | PRN

## 2012-03-08 MED ORDER — DOCUSATE SODIUM 100 MG PO CAPS
100.0000 mg | ORAL_CAPSULE | Freq: Every day | ORAL | Status: DC
  Administered 2012-03-09: 100 mg via ORAL
  Filled 2012-03-08 (×2): qty 1

## 2012-03-08 MED ORDER — VITAMIN D (ERGOCALCIFEROL) 1.25 MG (50000 UNIT) PO CAPS
50000.0000 [IU] | ORAL_CAPSULE | ORAL | Status: DC
  Administered 2012-03-08: 50000 [IU] via ORAL
  Filled 2012-03-08: qty 1

## 2012-03-08 MED ORDER — HYDRALAZINE HCL 20 MG/ML IJ SOLN
10.0000 mg | INTRAMUSCULAR | Status: DC | PRN
  Filled 2012-03-08: qty 0.5

## 2012-03-08 MED ORDER — EZETIMIBE-SIMVASTATIN 10-40 MG PO TABS
1.0000 | ORAL_TABLET | Freq: Every day | ORAL | Status: DC
  Administered 2012-03-08 – 2012-03-09 (×2): 1 via ORAL
  Filled 2012-03-08 (×3): qty 1

## 2012-03-08 MED ORDER — 0.9 % SODIUM CHLORIDE (POUR BTL) OPTIME
TOPICAL | Status: DC | PRN
  Administered 2012-03-08: 2000 mL

## 2012-03-08 MED ORDER — ACETAMINOPHEN 650 MG RE SUPP: 325.0000 mg | RECTAL | Status: DC | PRN

## 2012-03-08 MED ORDER — ROCURONIUM BROMIDE 100 MG/10ML IV SOLN
INTRAVENOUS | Status: DC | PRN
  Administered 2012-03-08: 35 mg via INTRAVENOUS
  Administered 2012-03-08: 15 mg via INTRAVENOUS

## 2012-03-08 MED ORDER — LIDOCAINE HCL (PF) 1 % IJ SOLN
INTRAMUSCULAR | Status: AC
  Filled 2012-03-08: qty 30

## 2012-03-08 MED ORDER — SODIUM CHLORIDE 0.9 % IV SOLN: 500.0000 mL | Freq: Once | INTRAVENOUS | Status: AC | PRN

## 2012-03-08 MED ORDER — BISACODYL 10 MG RE SUPP: 10.0000 mg | Freq: Every day | RECTAL | Status: DC | PRN

## 2012-03-08 MED ORDER — PROPOFOL 10 MG/ML IV BOLUS
INTRAVENOUS | Status: DC | PRN
  Administered 2012-03-08: 120 mg via INTRAVENOUS
  Administered 2012-03-08: 30 mg via INTRAVENOUS

## 2012-03-08 MED ORDER — MORPHINE SULFATE 2 MG/ML IJ SOLN: 2.0000 mg | INTRAMUSCULAR | Status: DC | PRN

## 2012-03-08 SURGICAL SUPPLY — 70 items
ADH SKN CLS APL DERMABOND .7 (GAUZE/BANDAGES/DRESSINGS) ×3
BANDAGE ESMARK 6X9 LF (GAUZE/BANDAGES/DRESSINGS) IMPLANT
BNDG CMPR 9X6 STRL LF SNTH (GAUZE/BANDAGES/DRESSINGS)
BNDG ESMARK 6X9 LF (GAUZE/BANDAGES/DRESSINGS)
BOOT SUTURE AID YELLOW STND (SUTURE) IMPLANT
CANISTER SUCTION 2500CC (MISCELLANEOUS) ×4 IMPLANT
CATH EMB 3FR 80CM (CATHETERS) IMPLANT
CATH EMB 4FR 80CM (CATHETERS) IMPLANT
CATH EMB 5FR 80CM (CATHETERS) ×2 IMPLANT
CATH EMB 6FR 80CM (CATHETERS) ×2 IMPLANT
CLIP TI MEDIUM 24 (CLIP) ×4 IMPLANT
CLIP TI WIDE RED SMALL 24 (CLIP) ×4 IMPLANT
CLOTH BEACON ORANGE TIMEOUT ST (SAFETY) ×4 IMPLANT
COVER SURGICAL LIGHT HANDLE (MISCELLANEOUS) ×4 IMPLANT
CUFF TOURNIQUET SINGLE 18IN (TOURNIQUET CUFF) IMPLANT
CUFF TOURNIQUET SINGLE 24IN (TOURNIQUET CUFF) IMPLANT
CUFF TOURNIQUET SINGLE 34IN LL (TOURNIQUET CUFF) IMPLANT
CUFF TOURNIQUET SINGLE 44IN (TOURNIQUET CUFF) IMPLANT
DECANTER SPIKE VIAL GLASS SM (MISCELLANEOUS) IMPLANT
DERMABOND ADVANCED (GAUZE/BANDAGES/DRESSINGS) ×1
DERMABOND ADVANCED .7 DNX12 (GAUZE/BANDAGES/DRESSINGS) ×3 IMPLANT
DRAIN SNY 10X20 3/4 PERF (WOUND CARE) IMPLANT
DRAPE WARM FLUID 44X44 (DRAPE) ×4 IMPLANT
DRAPE X-RAY CASS 24X20 (DRAPES) IMPLANT
ELECT REM PT RETURN 9FT ADLT (ELECTROSURGICAL) ×4
ELECTRODE REM PT RTRN 9FT ADLT (ELECTROSURGICAL) ×3 IMPLANT
EVACUATOR SILICONE 100CC (DRAIN) IMPLANT
GLOVE BIOGEL PI IND STRL 6.5 (GLOVE) ×5 IMPLANT
GLOVE BIOGEL PI IND STRL 7.0 (GLOVE) ×1 IMPLANT
GLOVE BIOGEL PI IND STRL 7.5 (GLOVE) ×1 IMPLANT
GLOVE BIOGEL PI INDICATOR 6.5 (GLOVE) ×5
GLOVE BIOGEL PI INDICATOR 7.0 (GLOVE) ×1
GLOVE BIOGEL PI INDICATOR 7.5 (GLOVE) ×1
GLOVE ECLIPSE 7.0 STRL STRAW (GLOVE) ×2 IMPLANT
GLOVE SS BIOGEL STRL SZ 7 (GLOVE) ×4 IMPLANT
GLOVE SUPERSENSE BIOGEL SZ 7 (GLOVE) ×2
GLOVE SURG SS PI 6.5 STRL IVOR (GLOVE) ×4 IMPLANT
GOWN STRL NON-REIN LRG LVL3 (GOWN DISPOSABLE) ×14 IMPLANT
HEMASHIELD FINESSE CARDIO (Vascular Products) ×4 IMPLANT
INSERT FOGARTY SM (MISCELLANEOUS) ×4 IMPLANT
KIT BASIN OR (CUSTOM PROCEDURE TRAY) ×4 IMPLANT
KIT ROOM TURNOVER OR (KITS) ×4 IMPLANT
NS IRRIG 1000ML POUR BTL (IV SOLUTION) ×8 IMPLANT
PACK PERIPHERAL VASCULAR (CUSTOM PROCEDURE TRAY) ×4 IMPLANT
PAD ARMBOARD 7.5X6 YLW CONV (MISCELLANEOUS) ×8 IMPLANT
PADDING CAST COTTON 6X4 STRL (CAST SUPPLIES) IMPLANT
PATCH HEMASHIELD FINESS CARDIO (Vascular Products) ×1 IMPLANT
SET COLLECT BLD 21X3/4 12 (NEEDLE) IMPLANT
STAPLER VISISTAT 35W (STAPLE) ×2 IMPLANT
STOPCOCK 4 WAY LG BORE MALE ST (IV SETS) IMPLANT
SUT PROLENE 5 0 C 1 24 (SUTURE) ×6 IMPLANT
SUT PROLENE 6 0 BV (SUTURE) ×4 IMPLANT
SUT PROLENE 6 0 C 1 24 (SUTURE) ×4 IMPLANT
SUT PROLENE 6 0 CC (SUTURE) ×10 IMPLANT
SUT PROLENE 7 0 BV 1 (SUTURE) ×2 IMPLANT
SUT PROLENE 7 0 BV1 MDA (SUTURE) IMPLANT
SUT SILK 2 0 SH (SUTURE) ×4 IMPLANT
SUT SILK 3 0 (SUTURE)
SUT SILK 3-0 18XBRD TIE 12 (SUTURE) IMPLANT
SUT VIC AB 2-0 CTX 36 (SUTURE) ×8 IMPLANT
SUT VIC AB 3-0 SH 27 (SUTURE) ×8
SUT VIC AB 3-0 SH 27X BRD (SUTURE) ×6 IMPLANT
SYR 3ML LL SCALE MARK (SYRINGE) ×6 IMPLANT
SYR TB 1ML LUER SLIP (SYRINGE) ×2 IMPLANT
TOWEL OR 17X24 6PK STRL BLUE (TOWEL DISPOSABLE) ×8 IMPLANT
TOWEL OR 17X26 10 PK STRL BLUE (TOWEL DISPOSABLE) ×8 IMPLANT
TRAY FOLEY CATH 14FRSI W/METER (CATHETERS) ×4 IMPLANT
TUBING EXTENTION W/L.L. (IV SETS) IMPLANT
UNDERPAD 30X30 INCONTINENT (UNDERPADS AND DIAPERS) ×4 IMPLANT
WATER STERILE IRR 1000ML POUR (IV SOLUTION) ×4 IMPLANT

## 2012-03-08 NOTE — Progress Notes (Signed)
Patient unable to void at this time

## 2012-03-08 NOTE — Transfer of Care (Signed)
Immediate Anesthesia Transfer of Care Note  Patient: [name]  Procedure(s) Performed: Procedure(s) (LRB) with comments: EMBOLECTOMY (Left) - THROMBECTOMY OF LEFT LIMB OF AORTO-FEMORAL BYPASS GRAFT PATCH ANGIOPLASTY (Left) - Left fropunda endarterectomy with patch angioplasty  Patient Location: PACU  Anesthesia Type:General  Level of Consciousness: awake, alert  and oriented  Airway & Oxygen Therapy: Patient Spontanous Breathing and Patient connected to face mask oxygen  Post-op Assessment: Report given to PACU RN and Post -op Vital signs reviewed and stable  Post vital signs: Reviewed and stable  Complications: No apparent anesthesia complications

## 2012-03-08 NOTE — Progress Notes (Signed)
Report to S. Jones RN as primary caregiver 

## 2012-03-08 NOTE — Interval H&P Note (Signed)
History and Physical Interval Note:  03/08/2012 7:01 AM  [name]  has presented today for surgery, with the diagnosis of ischemic left leg  The various methods of treatment have been discussed with the patient and family. After consideration of risks, benefits and other options for treatment, the patient has consented to  Procedure(s) (LRB) with comments: BYPASS GRAFT FEMORAL-FEMORAL ARTERY (N/A) - right to left EMBOLECTOMY (Left) - LEFT LIMB OF AORTO-FEMORAL BYPASS GRAFT BYPASS GRAFT FEMORAL-POPLITEAL ARTERY (Left) as a surgical intervention .  The patient's history has been reviewed, patient examined, no change in status, stable for surgery.  I have reviewed the patient's chart and labs.  Questions were answered to the patient's satisfaction.     [name]

## 2012-03-08 NOTE — Anesthesia Preprocedure Evaluation (Addendum)
Anesthesia Evaluation  Patient identified by MRN, date of birth, ID band Patient awake    Reviewed: Allergy & Precautions, H&P , NPO status , Patient's Chart, lab work & pertinent test results  History of Anesthesia Complications Negative for: history of anesthetic complications  Airway Mallampati: I TM Distance: >3 FB Neck ROM: full    Dental  (+) Teeth Intact and Dental Advisory Given   Pulmonary Current Smoker,  60 year pack history + rhonchi         Cardiovascular hypertension, Pt. on medications and Pt. on home beta blockers + CAD and + Peripheral Vascular Disease Rhythm:regular Rate:Normal     Neuro/Psych PSYCHIATRIC DISORDERS    GI/Hepatic GERD-  Medicated and Controlled,  Endo/Other  diabetes, Type 1, Insulin Dependent  Renal/GU Renal disease     Musculoskeletal   Abdominal   Peds  Hematology   Anesthesia Other Findings   Reproductive/Obstetrics                          Anesthesia Physical Anesthesia Plan  ASA: III  Anesthesia Plan: General   Post-op Pain Management:    Induction: Intravenous  Airway Management Planned: Oral ETT  Additional Equipment:   Intra-op Plan:   Post-operative Plan: Possible Post-op intubation/ventilation  Informed Consent: I have reviewed the patients History and Physical, chart, labs and discussed the procedure including the risks, benefits and alternatives for the proposed anesthesia with the patient or authorized representative who has indicated his/her understanding and acceptance.     Plan Discussed with: CRNA, Anesthesiologist and Surgeon  Anesthesia Plan Comments:         Anesthesia Quick Evaluation

## 2012-03-08 NOTE — H&P (View-Only) (Signed)
[facility] [place] [facility]/PVD Office Note  CC: Cold and painful left lower extremity Referring Physician: Hart Rochester  History of Present Illness: 78 year old male patient of Dr. Hart Rochester is followed status post a previous aortobifem bypass graft in 2002. The patient states he did have a cryoablation of a kidney lesion Friday and this past Saturday the 9th of Nov, he developed coldness and pain in his left lower extremity with decreased sensation. The patient called asking to be evaluated we brought him in for such.  Past Medical History  Diagnosis Date  . Hypertension   . Anemia   . PAD (peripheral artery disease) 10/10    Angiogram  Dr Myra Gianotti , severe stenosis left profunda after insertion of aorto-bifemoral  bypass graft-total occulsionof left SFA. Collateral flow through the profunda femoral artery.  Marland Kitchen CAD in native artery 2003    by cath   . Hypercholesteremia   . Thyroid disease   . Thyroiditis, unspecified   . Osteoarthritis of spine   . Olecranon bursitis   . 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 GERD (gastroesophageal reflux disease)   . Diabetes mellitus   . History of colonic polyps   . Iron deficiency anemia, unspecified   . Leg pain   . Numbness   . History of colonoscopy   . Chronic kidney disease     ROS: [x]  Positive   [ ]  Denies    General: [ ]  Weight loss, [ ]  Fever, [ ]  chills Neurologic: [ ]  Dizziness, [ ]  Blackouts, [ ]  Seizure [ ]  Stroke, [ ]  "Mini stroke", [ ]  Slurred speech, [ ]  Temporary blindness; [ ]  weakness in arms or legs, [ ]  Hoarseness Cardiac: [ ]  Chest pain/pressure, [ ]  Shortness of breath at rest [ ]  Shortness of breath with exertion, [ ]  Atrial fibrillation or irregular heartbeat Vascular: [ ]  Pain in legs with walking, [ ]  Pain in legs at rest, [ ]  Pain in legs at night,  [ ]  Non-healing ulcer, [ ]  Blood clot in vein/DVT,   Pulmonary: [ ]  Home oxygen, [ ]  Productive cough, [ ]  Coughing up blood, [ ]   Asthma,  [ ]  Wheezing Musculoskeletal:  [ ]  Arthritis, [ ]  Low back pain, [ ]  Joint pain Hematologic: [ ]  Easy Bruising, [ ]  Anemia; [ ]  Hepatitis Gastrointestinal: [ ]  Blood in stool, [ ]  Gastroesophageal Reflux/heartburn, [ ]  Trouble swallowing Urinary: [ ]  chronic Kidney disease, [ ]  on HD - [ ]  MWF or [ ]  TTHS, [ ]  Burning with urination, [ ]  Difficulty urinating Skin: [ ]  Rashes, [ ]  Wounds Psychological: [ ]  Anxiety, [ ]  Depression   Social History History  Substance Use Topics  . Smoking status: Current Every Day Smoker -- 1.0 packs/day for 60 years    Types: Cigarettes  . Smokeless tobacco: Never Used  . Alcohol Use: No    Family History Family History  Problem Relation Age of Onset  . Kidney disease Sister   . Heart attack Father   . Coronary artery disease Father   . Cancer Mother     Allergies  Allergen Reactions  . Clopidogrel Bisulfate Hives    Current Outpatient Prescriptions  Medication Sig Dispense Refill  . atenolol (TENORMIN) 25 MG tablet Take 25 mg by mouth every morning.      . calcitRIOL (ROCALTROL) 0.5 MCG capsule Take 0.5 mcg by mouth daily.       Marland Kitchen dipyridamole-aspirin (AGGRENOX) 200-25 MG per  12 hr capsule Take 1 capsule by mouth 2 (two) times daily.      Marland Kitchen ezetimibe-simvastatin (VYTORIN) 10-40 MG per tablet Take 1 tablet by mouth every morning.      . ferrous sulfate 325 (65 FE) MG tablet Take 325 mg by mouth 3 (three) times a week.      . hydrochlorothiazide (HYDRODIURIL) 25 MG tablet Take 25 mg by mouth every morning.      Marland Kitchen HYDROcodone-acetaminophen (NORCO) 10-325 MG per tablet Take 1 tablet by mouth every 4 (four) hours as needed. For pain  100 tablet  2  . insulin lispro protamine-insulin lispro (HUMALOG 75/25) (75-25) 100 UNIT/ML SUSP Inject 11-16 Units into the skin 2 (two) times daily with a meal. Inject into the skin 2 (two) times daily with a meal, 16 units with breakfast, and 11 units with the evening meal.      . OVER THE COUNTER  MEDICATION Apply 1 application topically daily as needed. Apply to painful areas around knees      . sildenafil (VIAGRA) 100 MG tablet Take 100 mg by mouth daily as needed.      . Vitamin D, Ergocalciferol, (DRISDOL) 50000 UNITS CAPS Take 50,000 Units by mouth 2 (two) times a week. On Tuesday and Saturday        Physical Examination  Filed Vitals:   03/06/12 1427  BP: 134/74  Pulse: 66  Resp: 16    Body mass index is 18.87 kg/(m^2).  General:  WDWN in NAD Gait: Normal HEENT: WNL Eyes: Pupils equal Pulmonary: normal non-labored breathing , without Rales, rhonchi,  wheezing Cardiac: RRR, without  Murmurs, rubs or gallops; No carotid bruits Abdomen: soft, NT, no masses Skin: no rashes, ulcers noted Vascular Exam/Pulses: Dr. Hart Rochester examined the patient as well as I, he does have a palpable right femoral pulse with no femoral pulse on the left, he can dorsiflex and plantarflex with 4/5 strength on the left, there is no pain in his calf or foot with palpation and he does have sensation in his left lower extremity it is just somewhat diminished to touch. There is no discoloration, no open ulcerations and the patient is able to ambulate but is currently using a wheelchair because he has diminished sensation and is afraid of a fall.  Extremities without ischemic changes, no Gangrene , no cellulitis; no open wounds;  Musculoskeletal: no muscle wasting or atrophy  Neurologic: A&O X 3; Appropriate Affect ; SENSATION: normal; MOTOR FUNCTION:  moving all extremities equally. Speech is fluent/normal  Non-Invasive Vascular Imaging: ABIs repeated today showing an ABI on the right of 0.78, 0 on the left. This is diminished from his previous ABIs from 3 weeks ago  ASSESSMENT/PLAN: Dr. Hart Rochester spoke with the patient at length the plan will be for the patient to have surgery on Thursday, November 14 for a possible right to left fem-fem bypass and attempted left lower extremity thrombectomy with the  possibility of a left femoropopliteal bypass however Dr. Hart Rochester will not to the extent of the procedure until he has had time in the OR to evaluate the patient's situation. The patient understands this, he is in agreement his followup will be pending his hospitalization stay and recovery.  Lauree Chandler ANP  Clinic M.D.: Hart Rochester

## 2012-03-08 NOTE — Progress Notes (Signed)
Pt has a Doppler peroneal pulse on LLE--checked by Dr Hart Rochester.

## 2012-03-08 NOTE — Op Note (Signed)
OPERATIVE REPORT  Date of Surgery: 03/08/2012  Surgeon: Josephina Gip, MD  Assistant: Lianne Cure PA    preop diagnosis-subacute ischemia left leg due to 2 occlusion left limb of the aortobifemoral bypass graft with known left superficial femoral occlusion and stenosis origin left profunda femoris artery  Postop diagnosis-same  Procedure: Procedure(s): #1 thrombectomy left limb aortobifemoral bypass graft #2 common and profunda femoral endarterectomy #3 Dacron patch angioplasty left profunda femoris artery-profundoplasty  Anesthesia: General  EBL: 200 cc  Complications: None  Procedure Details: The patient was taken to the operating room placed in supine position at which time satisfactory general endotracheal anesthesia was administered. The lower abdomen both groins and the left lower extremity were prepped with Betadine scrub and solution draped in routine sterile manner. A longitudinal incision was made in the left inguinal area through the previous scar carried down through subcutaneous tissue. Left limb of aortobifemoral bypass graft was dissected free for proximal control. He was thrombosed. The native common femoral artery was severely diseased with calcific plaque and was dissected free proximally. Superficial femoral artery was known to be chronically occluded was encircled with a vessel loop. The profunda was a large vessel its secondary branches were dissected free and encircled with vessel. There was a known stenosis at the origin of the profundus previous angiogram 3 years earlier. Patient was then heparinized. Longitudinal opening made in the hood of the left limb of the aortobifemoral graft and the common femoral artery. Fresh thrombus filled the left limb of the graft. A 5 and 6 Fogarty catheter passed proximally up into the body of the graft in the left limb was easily thrombectomized with excellent inflow being reestablished. Additional passage of no further proximal clot.  The origin of the profundus was essentially totally occluded with calcific plaque which was the cause of the thrombosis left limb of the graft. Therefore endarterectomy of the common femoral in the origin of the profunda was performed without difficulty. The intima was tacked down with 3 6-0 Prolene sutures. Distal vessel was large and of excellent quality and there was good backbleeding. Dacron patch was then fashioned appropriately and sewn into place with 6-0 Prolene. Prior to completion of the closure proper appropriate flushing was performed antegrade and retrograde closure completed with reestablishment of flow down the profunda. There was an audible flow in the anterior tibial artery at the ankle which was not present preoperatively. Protamine was given to reverse the heparin following adequate hemostasis wound was irrigated with saline closed in layers with Vicryl in a subcuticular fashion with Dermabond patient taken to recovery room in stable condition  Josephina Gip, MD 03/08/2012 10:04 AM

## 2012-03-08 NOTE — Anesthesia Postprocedure Evaluation (Signed)
  Anesthesia Post-op Note  Patient: [name]  Procedure(s) Performed: Procedure(s) (LRB) with comments: EMBOLECTOMY (Left) - THROMBECTOMY OF LEFT LIMB OF AORTO-FEMORAL BYPASS GRAFT PATCH ANGIOPLASTY (Left) - Left fropunda endarterectomy with patch angioplasty  Patient Location: PACU  Anesthesia Type:General  Level of Consciousness: awake, oriented, sedated and patient cooperative  Airway and Oxygen Therapy: Patient Spontanous Breathing  Post-op Pain: mild  Post-op Assessment: Post-op Vital signs reviewed, Patient's Cardiovascular Status Stable, Respiratory Function Stable, Patent Airway, No signs of Nausea or vomiting and Pain level controlled  Post-op Vital Signs: stable  Complications: No apparent anesthesia complications

## 2012-03-08 NOTE — Progress Notes (Signed)
Utilization Review Completed.Dorcas Carrow T11/14/2013

## 2012-03-08 NOTE — Preoperative (Signed)
Beta Blockers   Reason not to administer Beta Blockers:Not Applicable 

## 2012-03-08 NOTE — Anesthesia Procedure Notes (Signed)
Procedure Name: Intubation Date/Time: 03/08/2012 7:54 AM Performed by: [name] Pre-anesthesia Checklist: Patient identified, Emergency Drugs available, Suction available, Patient being monitored and Timeout performed Patient Re-evaluated:Patient Re-evaluated prior to inductionOxygen Delivery Method: Circle system utilized Preoxygenation: Pre-oxygenation with 100% oxygen Intubation Type: IV induction Ventilation: Mask ventilation without difficulty and Oral airway inserted - appropriate to patient size Laryngoscope Size: Miller and 3 Grade View: Grade I Tube type: Oral Tube size: 8.0 mm Number of attempts: 1 Airway Equipment and Method: Stylet and LTA kit utilized Placement Confirmation: breath sounds checked- equal and bilateral,  positive ETCO2 and ETT inserted through vocal cords under direct vision Secured at: 24 cm Tube secured with: Tape Dental Injury: Teeth and Oropharynx as per pre-operative assessment

## 2012-03-09 ENCOUNTER — Encounter (HOSPITAL_COMMUNITY): Payer: Self-pay | Admitting: Vascular Surgery

## 2012-03-09 DIAGNOSIS — Z48812 Encounter for surgical aftercare following surgery on the circulatory system: Secondary | ICD-10-CM

## 2012-03-09 LAB — GLUCOSE, CAPILLARY
Glucose-Capillary: 150 mg/dL — ABNORMAL HIGH (ref 70–99)
Glucose-Capillary: 86 mg/dL (ref 70–99)

## 2012-03-09 LAB — CBC
Platelets: 138 10*3/uL — ABNORMAL LOW (ref 150–400)
RDW: 17.7 % — ABNORMAL HIGH (ref 11.5–15.5)
WBC: 9.8 10*3/uL (ref 4.0–10.5)

## 2012-03-09 LAB — BASIC METABOLIC PANEL
Chloride: 104 mEq/L (ref 96–112)
Creatinine, Ser: 1.62 mg/dL — ABNORMAL HIGH (ref 0.50–1.35)
GFR calc Af Amer: 45 mL/min — ABNORMAL LOW (ref >90)
Potassium: 3.8 mEq/L (ref 3.5–5.1)
Sodium: 135 mEq/L (ref 135–145)

## 2012-03-09 NOTE — Evaluation (Signed)
Physical Therapy Evaluation Patient Details Name: [name] MRN: [id number] DOB: 03/14/1931 Today's Date: 03/09/2012 Time: 4098-1191 PT Time Calculation (min): 18 min  PT Assessment / Plan / Recommendation Clinical Impression  [name] is 78 y/o male s/p thrombectomy of LLE of aorto-fem bypass graft. Presents to PT today with limited mobility and balance secondary to pain, weakness and sensory changes. Will benefit physical therapy in the acute setting to maxmimize safety with mobility in prep for d/c home. Rec pt use RW at home as well as HHPT to f/u on balance.     PT Assessment  Patient needs continued PT services    Follow Up Recommendations  Home health PT;Supervision for mobility/OOB    Does the patient have the potential to tolerate intense rehabilitation      Barriers to Discharge Inaccessible home environment      Equipment Recommendations  None recommended by PT    Recommendations for Other Services     Frequency Min 3X/week    Precautions / Restrictions Precautions Precautions: Fall Restrictions Weight Bearing Restrictions: No         Mobility  Bed Mobility Bed Mobility: Supine to Sit;Sitting - Scoot to Edge of Bed Supine to Sit: 6: Modified independent (Device/Increase time);HOB elevated (30 degrees) Sitting - Scoot to Edge of Bed: 6: Modified independent (Device/Increase time) Details for Bed Mobility Assistance: slower because of pain Transfers Transfers: Sit to Stand;Stand to Sit Sit to Stand: 4: Min guard;From bed;With upper extremity assist;From chair/3-in-1 Stand to Sit: 4: Min guard;With armrests;To chair/3-in-1;With upper extremity assist Details for Transfer Assistance: cues for safe hand placement and close gaurding for stabiliy initially (pt reaching out for support) Ambulation/Gait Ambulation/Gait Assistance: 4: Min guard Ambulation Distance (Feet): 100 Feet Assistive device: Rolling walker;Straight cane Ambulation/Gait Assistance  Details: Attempted ambulation with cane but pt with lateral instabiliy listing more to the right with slowed corrective reaction, with RW pt more stable however still very flexed trunk (reports it hurts his back to stand up any straighter) Gait Pattern: Trunk flexed;Lateral trunk lean to right Stairs: No    Shoulder Instructions     Exercises General Exercises - Lower Extremity Ankle Circles/Pumps: AROM;Both;10 reps;Seated   PT Diagnosis: Difficulty walking;Abnormality of gait;Generalized weakness;Acute pain  PT Problem List: Decreased strength;Decreased activity tolerance;Decreased balance;Decreased mobility;Decreased knowledge of use of DME;Decreased safety awareness;Pain;Impaired sensation PT Treatment Interventions: DME instruction;Gait training;Stair training;Functional mobility training;Patient/family education;Therapeutic activities;Therapeutic exercise;Balance training   PT Goals Acute Rehab PT Goals PT Goal Formulation: With patient Time For Goal Achievement: 03/16/12 Potential to Achieve Goals: Good Pt will go Sit to Stand: with modified independence PT Goal: Sit to Stand - Progress: Goal set today Pt will go Stand to Sit: with modified independence PT Goal: Stand to Sit - Progress: Goal set today Pt will Transfer Bed to Chair/Chair to Bed: with modified independence PT Transfer Goal: Bed to Chair/Chair to Bed - Progress: Goal set today Pt will Ambulate: >150 feet;with modified independence;with least restrictive assistive device PT Goal: Ambulate - Progress: Goal set today Pt will Go Up / Down Stairs: 3-5 stairs;with modified independence;with rail(s) PT Goal: Up/Down Stairs - Progress: Goal set today  Visit Information  Last PT Received On: 03/09/12 Assistance Needed: +1    Subjective Data  Subjective: I do OK at home, sometimes I use my cane.  Patient Stated Goal: home   Prior Functioning  Home Living Lives With: Spouse Available Help at Discharge:  Family;Available 24 hours/day (supervision only) Type of Home: House Home  Access: Stairs to enter [employer] of Steps: 5 Entrance Stairs-Rails: Right Home Layout: Laundry or work area in basement;Multi-level;Bed/bath upstairs Alternate Teacher, music of Steps: 1 flight Alternate Level Stairs-Rails: Right Home Adaptive Equipment: Environmental consultant - rolling;Straight cane Prior Function Level of Independence: Independent Able to Take Stairs?: Yes Driving: Yes Vocation: Retired Musician: No difficulties    Cognition  Overall Cognitive Status: Impaired Area of Impairment: Safety/judgement Arousal/Alertness: Awake/alert Orientation Level: Appears intact for tasks assessed Behavior During Session: WFL for tasks performed Safety/Judgement: Impulsive;Decreased safety judgement for tasks assessed    Extremity/Trunk Assessment Right Upper Extremity Assessment RUE ROM/Strength/Tone: [facility] for tasks assessed Left Upper Extremity Assessment LUE ROM/Strength/Tone: WFL for tasks assessed Right Lower Extremity Assessment RLE ROM/Strength/Tone: [facility] for tasks assessed Left Lower Extremity Assessment LLE ROM/Strength/Tone: Unable to fully assess;Due to pain;WFL for tasks assessed LLE ROM/Strength/Tone Deficits: performing DF/PF and SLR without difficulty other than pain LLE Sensation: Deficits LLE Sensation Deficits: diminshed LT with c/o tingling (reports this has improved since surgery) Trunk Assessment Trunk Assessment: Kyphotic (significantly flexed in standing)   Balance    End of Session PT - End of Session Equipment Utilized During Treatment: Gait belt Activity Tolerance: Patient tolerated treatment well Patient left: in chair;with call bell/phone within reach;with nursing in room Nurse Communication: Mobility status  GP     [facility] HELEN 03/09/2012, 1:05 PM

## 2012-03-09 NOTE — Evaluation (Signed)
Occupational Therapy Evaluation Patient Details Name: [name] MRN: [id number] DOB: 1930/11/06 Today's Date: 03/09/2012 Time: 1478-2956 OT Time Calculation (min): 15 min  OT Assessment / Plan / Recommendation Clinical Impression  This 78 y.o. male admitted s/p thrombectomy of LLE of aorto-fem bypass graft.  Pt. with increased pain with OT which limited participation in eval.  Pt. will benefit from OT to maximize safety and independence with BADLs to allow pt. to return home with wife at a supervision level    OT Assessment  Patient needs continued OT Services    Follow Up Recommendations  No OT follow up;Supervision - Intermittent    Barriers to Discharge None    Equipment Recommendations  None recommended by OT;None recommended by PT    Recommendations for Other Services    Frequency  Min 2X/week    Precautions / Restrictions Precautions Precautions: Fall Restrictions Weight Bearing Restrictions: No   Pertinent Vitals/Pain    ADL  Eating/Feeding: Independent Where Assessed - Eating/Feeding: Edge of bed Grooming: Wash/dry hands;Wash/dry face;Teeth care;Set up Where Assessed - Grooming: Supported sitting Upper Body Bathing: Set up Where Assessed - Upper Body Bathing: Supported sitting Lower Body Bathing: Minimal assistance Where Assessed - Lower Body Bathing: Supported sit to stand Upper Body Dressing: Set up Where Assessed - Upper Body Dressing: Unsupported sitting Lower Body Dressing: Minimal assistance Where Assessed - Lower Body Dressing: Supported sit to Pharmacist, hospital: Minimal assistance Toilet Transfer Method: Sit to Barista: Regular height toilet Toileting - Clothing Manipulation and Hygiene: Minimal assistance Where Assessed - Engineer, mining and Hygiene: Standing Tub/Shower Transfer: +1 Total assistance (pt unable due to pain) Tub/Shower Transfer Method: Science writer: Shower  seat with back Equipment Used: Rolling walker Transfers/Ambulation Related to ADLs: ambulates with min A.  Pt. with c/o severe pain Lt. thigh when ambulating in room, and had to sit due to pain.   ADL Comments: Pt. able to access bil. LEs without difficulty to don/doff socks.  Requires min A for standing balance due to pain, and decreased safety awareness    OT Diagnosis: Generalized weakness;Acute pain  OT Problem List: Decreased strength;Decreased activity tolerance;Impaired balance (sitting and/or standing);Decreased safety awareness;Decreased knowledge of use of DME or AE;Pain OT Treatment Interventions: Self-care/ADL training;DME and/or AE instruction;Therapeutic activities;Patient/family education;Balance training   OT Goals Acute Rehab OT Goals OT Goal Formulation: With patient Time For Goal Achievement: 03/16/12 Potential to Achieve Goals: Good ADL Goals Pt Will Perform Grooming: with supervision;Standing at sink ADL Goal: Grooming - Progress: Goal set today Pt Will Transfer to Toilet: with supervision;Ambulation;Regular height toilet ADL Goal: Toilet Transfer - Progress: Goal set today Pt Will Perform Tub/Shower Transfer: with supervision;Ambulation;Shower seat with back;Shower seat without back ADL Goal: Web designer - Progress: Goal set today  Visit Information  Last OT Received On: 03/09/12 Assistance Needed: +1    Subjective Data  Subjective: "Oh my gosh, I can't stand it"  re: pain Lt. thigh when ambulating Patient Stated Goal: Pt. did not state   Prior Functioning     Home Living Lives With: Spouse Available Help at Discharge: Family;Available 24 hours/day (supervision only) Type of Home: House Home Access: Stairs to enter [employer] of Steps: 5 Entrance Stairs-Rails: Right Home Layout: Laundry or work area in basement;Multi-level;Bed/bath upstairs Alternate Teacher, music of Steps: 1 flight Alternate Level Stairs-Rails:  Right Bathroom Shower/Tub: Tub/shower unit (on main level) Firefighter: Standard Home Adaptive Equipment: Walker - rolling;Straight cane;Shower chair with back Prior Function  Level of Independence: Independent Able to Take Stairs?: Yes Driving: Yes Vocation: Retired Musician: No difficulties Dominant Hand: Right         Vision/Perception     Cognition  Overall Cognitive Status: Impaired Area of Impairment: Safety/judgement Arousal/Alertness: Awake/alert Orientation Level: Appears intact for tasks assessed Behavior During Session: WFL for tasks performed Safety/Judgement: Impulsive;Decreased safety judgement for tasks assessed Safety/Judgement - Other Comments: Pt. with poor walker safety    Extremity/Trunk Assessment Right Upper Extremity Assessment RUE ROM/Strength/Tone: WFL for tasks assessed RUE Coordination: WFL - gross/fine motor Left Upper Extremity Assessment LUE ROM/Strength/Tone: WFL for tasks assessed LUE Coordination: WFL - gross/fine motor Trunk Assessment Trunk Assessment: Kyphotic Trunk Exceptions: Pt. maintains flexed hips and trunk when ambulating     Mobility Bed Mobility Bed Mobility: Supine to Sit;Sitting - Scoot to Edge of Bed;Sit to Supine Supine to Sit: 6: Modified independent (Device/Increase time);HOB elevated Sitting - Scoot to Edge of Bed: 6: Modified independent (Device/Increase time) Sit to Supine: 6: Modified independent (Device/Increase time) Transfers Transfers: Sit to Stand;Stand to Sit Sit to Stand: 4: Min assist;From bed;With upper extremity assist Stand to Sit: 4: Min assist;With upper extremity assist;To bed Details for Transfer Assistance: Pt. requires verbal cues for safe hand placement and for walker safety      Shoulder Instructions     Exercise     Balance     End of Session OT - End of Session Activity Tolerance: Patient limited by pain Patient left: in bed;with call bell/phone within  reach;with bed alarm set;with nursing in room Nurse Communication: Patient requests pain meds  GO     Andreah Goheen M 03/09/2012, 6:04 PM

## 2012-03-09 NOTE — Progress Notes (Signed)
VASCULAR LAB PRELIMINARY  ARTERIAL  ABI completed:    RIGHT    LEFT    PRESSURE WAVEFORM  PRESSURE WAVEFORM  BRACHIAL 130 Triphasic  BRACHIAL 136 Triphasic   DP Barely audible Severely dampend DP absent   AT   AT    PT 112 Monophasic  PT >300 Dampened monophasic   PER   PER    GREAT TOE  NA GREAT TOE  NA    RIGHT LEFT  ABI 0.82 Inaccurate due to calcified vessel     [name], RVT 03/09/2012, 9:56 AM

## 2012-03-09 NOTE — Care Management Note (Unsigned)
    Page 1 of 1   03/09/2012     2:40:14 PM   CARE MANAGEMENT NOTE 03/09/2012  Patient:  [name], [name]   Account Number:  [id number]  Date Initiated:  03/09/2012  Documentation initiated by:  Rashida Ladouceur  Subjective/Objective Assessment:   PT ADM S/P THROMBECTOMY OF LLE AND AORTOFEMORAL BYPASS. PTA, PT INDEPENDENT, LIVES WITH SPOUSE.     Action/Plan:   PHYSICAL THERAPY EVALUATION DONE.  RECOMMENDATION IS FOR HOME HEALTH FOLLOW UP AT DC.   Anticipated DC Date:  03/10/2012   Anticipated DC Plan:  HOME W HOME HEALTH SERVICES      DC Planning Services  CM consult      Choice offered to / List presented to:             Status of service:  In process, will continue to follow Medicare Important Message given?   (If response is "NO", the following Medicare IM given date fields will be blank) Date Medicare IM given:   Date Additional Medicare IM given:    Discharge Disposition:    Per UR Regulation:  Reviewed for med. necessity/level of care/duration of stay  If discussed at Long Length of Stay Meetings, dates discussed:    CommentsRosalita Chessman [phone] 03/09/12 MD/PA:  Leona Carry ORDER FOR HOME HEALTH P.T. PATIENT HAS RW AT HOME.

## 2012-03-09 NOTE — Clinical Documentation Improvement (Signed)
Anemia Blood Loss Clarification  THIS DOCUMENT IS NOT A PERMANENT PART OF THE MEDICAL RECORD  RESPOND TO THE THIS QUERY, FOLLOW THE INSTRUCTIONS BELOW:  1. If needed, update documentation for the patient's encounter via the notes activity.  2. Access this query again and click edit on the In [employer].  3. After updating, or not, click F2 to complete all highlighted (required) fields concerning your review. Select "additional documentation in the medical record" OR "no additional documentation provided".  4. Click Sign note button.  5. The deficiency will fall out of your In Basket *Please let us know if you are not able to complete this workflow by phone or e-mail (listed below).        03/09/12  Dear Dr.Lawson/Associates  In an effort to better capture your patient's severity of illness, reflect appropriate length of stay and utilization of resources, a review of the patient medical record has revealed the following indicators.    Based on your clinical judgment, please clarify and document in a progress note and/or discharge summary the clinical condition associated with the following supporting information:  In responding to this query please exercise your independent judgment.  The fact that a query is asked, does not imply that any particular answer is desired or expected.   Possible Clinical Conditions?   " Expected Acute Blood Loss Anemia  " Acute Blood Loss Anemia  " Acute on chronic blood loss anemia  " Precipitous drop in Hematocrit  " Other Condition  " Cannot Clinically Determine     Signs and Symptoms: EBL:375ml, per 11/14 Anesthesia record.  Diagnostics: H&H on 11/14:  12.2/38.1 H&H on 11/15:  9.5/29.8   IV fluids / plasma expanders: 11/14:  Albumin, human 5%: , per 11/14 Anesthesia record  Medications: 11/15:  Ferrous sulfate 325mg  po 3x/week  Reviewed: No additional documentation provided.                                       Thank  You,  Marciano Sequin,  Clinical Documentation Specialist:  Pager: [phone]  Phone: [phone]  Health Information Management [place]

## 2012-03-10 MED ORDER — HYDROCODONE-ACETAMINOPHEN 10-325 MG PO TABS: 1.0000 | ORAL_TABLET | ORAL | Status: DC | PRN

## 2012-03-10 NOTE — Discharge Summary (Signed)
[facility] Discharge Summary   Patient ID:  [name] MRN: [id number] DOB/AGE: 76/06/1930 76 y.o.  Admit date: 03/08/2012 Discharge date: 03/10/2012 Date of Surgery: 03/08/2012 Surgeon: Surgeon(s): Pryor Ochoa, MD  Admission Diagnosis: ischemic left leg  Discharge Diagnoses:  ischemic left leg  Secondary Diagnoses: Past Medical History  Diagnosis Date  . Hypertension   . Anemia   . PAD (peripheral artery disease) 10/10    Angiogram  Dr Myra Gianotti , severe stenosis left profunda after insertion of aorto-bifemoral  bypass graft-total occulsionof left SFA. Collateral flow through the profunda femoral artery.  Marland Kitchen CAD in native artery 2003    by cath   . Hypercholesteremia   . Thyroid disease   . Thyroiditis, unspecified   . Osteoarthritis of spine   . Olecranon bursitis   . 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 GERD (gastroesophageal reflux disease)   . Diabetes mellitus   . History of colonic polyps   . Iron deficiency anemia, unspecified   . Leg pain   . Numbness   . History of colonoscopy   . Chronic kidney disease   . History of blood transfusion     Procedure(s): EMBOLECTOMY PATCH ANGIOPLASTY  Discharged Condition: good  HPI:  78 year old male patient of Dr. Hart Rochester is followed status post a previous aortobifem bypass graft in 2002. The patient states he did have a cryoablation of a kidney lesion Friday and this past Saturday the 9th of Nov, he developed coldness and pain in his left lower extremity with decreased sensation. The patient called asking to be evaluated we brought him in for such.    Hospital Course:  [name] is a 78 y.o. male is S/P Left Procedure(s): EMBOLECTOMY PATCH ANGIOPLASTY Extubated: POD # 0 Post-op wounds clean, dry, intact or healing well Pt. Ambulating, voiding and taking PO diet without difficulty. Pt pain controlled with PO pain meds. Labs as  below Complications:none  Consults:     Significant Diagnostic Studies: CBC Lab Results  Component Value Date   WBC 9.8 03/09/2012   HGB 9.5* 03/09/2012   HCT 29.8* 03/09/2012   MCV 85.1 03/09/2012   PLT 138* 03/09/2012    BMET    Component Value Date/Time   NA 135 03/09/2012 0436   K 3.8 03/09/2012 0436   CL 104 03/09/2012 0436   CO2 21 03/09/2012 0436   GLUCOSE 115* 03/09/2012 0436   BUN 23 03/09/2012 0436   CREATININE 1.62* 03/09/2012 0436   CALCIUM 8.1* 03/09/2012 0436   CALCIUM 9.0 06/29/2010 2210   GFRNONAA 39* 03/09/2012 0436   GFRAA 45* 03/09/2012 0436   COAG Lab Results  Component Value Date   INR 1.17 03/08/2012   INR 1.16 02/28/2012   INR 1.30 02/19/2009     Disposition:  Discharge to :Home Discharge Orders    Future Appointments: Provider: Department: Dept Phone: Center:   03/29/2012 11:15 AM Kathleene Hazel, MD [facility] Main Office [place]) [phone] [id number]   05/04/2012 9:30 AM Romero Belling, MD [facility] PRIMARY [facility] ENDOCRINOLOGY [phone] None   02/05/2013 9:30 AM [facility] [phone] [facility]   02/05/2013 10:00 AM [facility] [phone] [facility]   02/05/2013 11:00 AM Pryor Ochoa, MD [facility] [phone] [facility]     Future Orders Please Complete By Expires   Resume previous diet      Driving Restrictions  Comments:   No driving for 2 weeks   Lifting restrictions      Comments:   No lifting for 8 weeks   Call MD for:  temperature >100.5      Call MD for:  redness, tenderness, or signs of infection (pain, swelling, bleeding, redness, odor or green/yellow discharge around incision site)      Call MD for:  severe or increased pain, loss or decreased feeling  in affected limb(s)         [name], [name]  Home Medication Instructions [id number]   Printed on:03/10/12 0827  Medication Information                     calcitRIOL (ROCALTROL) 0.5 MCG capsule Take 0.5 mcg by mouth daily.            Vitamin D, Ergocalciferol, (DRISDOL) 50000 UNITS CAPS Take 50,000 Units by mouth 2 (two) times a week. On Tuesday and Saturday           ferrous sulfate 325 (65 FE) MG tablet Take 325 mg by mouth 3 (three) times a week.           dipyridamole-aspirin (AGGRENOX) 200-25 MG per 12 hr capsule Take 1 capsule by mouth 2 (two) times daily.           ezetimibe-simvastatin (VYTORIN) 10-40 MG per tablet Take 1 tablet by mouth every morning.           hydrochlorothiazide (HYDRODIURIL) 25 MG tablet Take 25 mg by mouth every morning.           atenolol (TENORMIN) 25 MG tablet Take 25 mg by mouth every morning.           insulin lispro protamine-insulin lispro (HUMALOG 75/25) (75-25) 100 UNIT/ML SUSP Inject 11-16 Units into the skin 2 (two) times daily with a meal. Inject into the skin 2 (two) times daily with a meal, 16 units with breakfast, and 11 units with the evening meal.           sildenafil (VIAGRA) 100 MG tablet Take 100 mg by mouth daily as needed. For sexual activity           HYDROcodone-acetaminophen (NORCO) 10-325 MG per tablet Take 1 tablet by mouth every 4 (four) hours as needed. For pain            Verbal and written Discharge instructions given to the patient. Wound care per Discharge AVS Follow-up Information    Follow up with Josephina Gip, MD. In 2 weeks. (sent)    Contact information:   [address] [place] [address] [phone]          Signed: Clinton Gallant [facility] 03/10/2012, 8:27 AM

## 2012-03-10 NOTE — Progress Notes (Addendum)
[facility] [place]  Progress Note Bypass Surgery  Date of Surgery: 03/08/2012  Procedure(s): EMBOLECTOMY PATCH ANGIOPLASTY Surgeon: Surgeon(s): Pryor Ochoa, MD  2 Days Post-Op  History of Present Illness  [name] is a 78 y.o. male who is S/P Procedure(s): EMBOLECTOMY PATCH ANGIOPLASTY left.  The patient's pre-op symptoms of pain are Improved . Patients pain is well controlled.    VASC. LAB Studies:       ABI completed:   RIGHT    LEFT     PRESSURE  WAVEFORM   PRESSURE  WAVEFORM   BRACHIAL  130  Triphasic  BRACHIAL  136  Triphasic   DP  Barely audible  Severely dampend  DP  absent    AT    AT     PT  112  Monophasic  PT  >300  Dampened monophasic   PER    PER     GREAT TOE   NA  GREAT TOE   NA     RIGHT  LEFT   ABI  0.82  Inaccurate due to calcified vessel         Imaging: No results found.  Significant Diagnostic Studies: CBC Lab Results  Component Value Date   WBC 9.8 03/09/2012   HGB 9.5* 03/09/2012   HCT 29.8* 03/09/2012   MCV 85.1 03/09/2012   PLT 138* 03/09/2012    BMET     Component Value Date/Time   NA 135 03/09/2012 0436   K 3.8 03/09/2012 0436   CL 104 03/09/2012 0436   CO2 21 03/09/2012 0436   GLUCOSE 115* 03/09/2012 0436   BUN 23 03/09/2012 0436   CREATININE 1.62* 03/09/2012 0436   CALCIUM 8.1* 03/09/2012 0436   CALCIUM 9.0 06/29/2010 2210   GFRNONAA 39* 03/09/2012 0436   GFRAA 45* 03/09/2012 0436    COAG Lab Results  Component Value Date   INR 1.17 03/08/2012   INR 1.16 02/28/2012   INR 1.30 02/19/2009   No results found for this basename: PTT    Physical Examination  BP Readings from Last 3 Encounters:  03/10/12 118/60  03/10/12 118/60  03/06/12 134/74   Temp Readings from Last 3 Encounters:  03/10/12 98.8 F (37.1 C) Oral  03/10/12 98.8 F (37.1 C) Oral  03/03/12 97.2 F (36.2 C) Oral   SpO2 Readings from Last 3 Encounters:  03/10/12 99%  03/10/12 99%  03/06/12 100%   Pulse  Readings from Last 3 Encounters:  03/10/12 74  03/10/12 74  03/06/12 66    Pt is A&O x 3 left lower extremity: Incision/s is/are clean,dry.intact, and  healing without hematoma, erythema or drainage Limb is warm; with good color    Assessment/Plan: Pt. Doing well Post-op pain is controlled Wounds are clean, dry, intact or healing well PT/OT for ambulation F/U in the office in 2 weeks with Dr. Harrell Gave, EMMA [facility] [phone] 03/10/2012 8:20 AM   Addendum  I have independently interviewed and examined the patient, and I agree with the physician assistant's findings.  ABI document improved flow in Left leg but continued tibial artery disease.  Leonides Sake, MD [facility] [place] Office: [phone] Pager: [phone]  03/10/2012, 10:16 AM

## 2012-03-10 NOTE — Progress Notes (Signed)
Patient given discharge information packet and all instructions. Volunteer services discharged him via wheelchair with his belongings.  Delma Freeze 03/10/2012

## 2012-03-12 ENCOUNTER — Telehealth: Payer: Self-pay | Admitting: Vascular Surgery

## 2012-03-12 ENCOUNTER — Telehealth: Payer: Self-pay

## 2012-03-12 NOTE — Telephone Encounter (Addendum)
Message copied by Shari Prows on Mon Mar 12, 2012 10:45 AM ------      Message from: Melene Plan      Created: Mon Mar 12, 2012  9:36 AM                   ----- Message -----         From: Lars Mage, PA         Sent: 03/10/2012   8:25 AM           To: Melene Plan, RN            F/U in 2 weeks with Dr. Hart Rochester post thrombectomy and femoral endarterectomy.  I scheduled an appt for the above pt on 03/27/12 at 2:15pm w/ JDL. He is aware of the appt and I also mailed an appt letter to his home. awt

## 2012-03-12 NOTE — Telephone Encounter (Signed)
Pt. called to report swelling of left leg, that has increased since he got up this AM.  Stated his incision looks good/ denies and separation of incision or any drainage.  Denies fever or chills.  States there is an area in left thigh approx. 5-6 " above knee that is tender.  Denies inflammation or warmth in the area of left thigh.  Advised to elevate his legs 3-4 times / day for 30 minutes each time, at a minimum.  Also, encouraged to stay mobile during day, in between resting.  Advised to call office if further concerns or symptoms worsen.  Verb. Understanding.

## 2012-03-13 ENCOUNTER — Telehealth: Payer: Self-pay | Admitting: Vascular Surgery

## 2012-03-13 NOTE — Telephone Encounter (Signed)
Message copied by Pryor Ochoa on Tue Mar 13, 2012  1:28 PM ------      Message from: Rodman Pickle      Created: Fri Mar 09, 2012  2:22 PM      Regarding: [phone]       Please note query issued 03/09/12.

## 2012-03-15 ENCOUNTER — Other Ambulatory Visit: Payer: Self-pay | Admitting: Interventional Radiology

## 2012-03-15 ENCOUNTER — Other Ambulatory Visit: Payer: Self-pay | Admitting: Emergency Medicine

## 2012-03-15 DIAGNOSIS — N2889 Other specified disorders of kidney and ureter: Secondary | ICD-10-CM

## 2012-03-15 DIAGNOSIS — C649 Malignant neoplasm of unspecified kidney, except renal pelvis: Secondary | ICD-10-CM

## 2012-03-26 ENCOUNTER — Encounter: Payer: Self-pay | Admitting: Vascular Surgery

## 2012-03-27 ENCOUNTER — Encounter: Payer: Self-pay | Admitting: Vascular Surgery

## 2012-03-27 ENCOUNTER — Ambulatory Visit (INDEPENDENT_AMBULATORY_CARE_PROVIDER_SITE_OTHER): Payer: Medicare Other | Admitting: Vascular Surgery

## 2012-03-27 VITALS — BP 150/70 | HR 67 | Resp 16 | Ht 74.0 in | Wt 147.0 lb

## 2012-03-27 DIAGNOSIS — I70219 Atherosclerosis of native arteries of extremities with intermittent claudication, unspecified extremity: Secondary | ICD-10-CM | POA: Insufficient documentation

## 2012-03-27 NOTE — Progress Notes (Signed)
Subjective:     Patient ID: [name], male   DOB: [birth year], 78 y.o.   MRN: [id number]  HPI this 78 year old male returns for initial followup regarding her recent procedure which included thrombectomy of left limb aortobifemoral bypass graft with profundoplasty for subacute occlusion left limb of aortobifemoral graft. He is doing well since his discharge with no pain in the left leg. He has had some postoperative swelling particularly below the knee and down ankle and foot area. This has improved with elevation. He has no history of DVT. He is beginning to ambulate increasing amounts.  Review of Systems     Objective:   Physical ExamBP 150/70  Pulse 67  Resp 16  Ht 6\' 2"  (1.88 m)  Wt 147 lb (66.679 kg)  BMI 18.87 kg/m2  Gen. elderly male no apparent stress alert and oriented x3 Left ankle incision well healed with 3+ femoral pulse No palpable pulses and foot with known left superficial femoral occlusion. Left foot pink and well perfused. 1+ edema at most in the left ankle and foot area none in pretibial area    Assessment:     Doing well post thrombectomy left limb aortobifemoral bypass with profundoplasty    Plan:     Return in 3 months with ABIs and further followup with nurse practitioner

## 2012-03-28 ENCOUNTER — Other Ambulatory Visit: Payer: Self-pay | Admitting: *Deleted

## 2012-03-28 DIAGNOSIS — I739 Peripheral vascular disease, unspecified: Secondary | ICD-10-CM

## 2012-03-28 DIAGNOSIS — Z48812 Encounter for surgical aftercare following surgery on the circulatory system: Secondary | ICD-10-CM

## 2012-03-28 LAB — CREATININE WITH EST GFR: Creat: 1.72 mg/dL — ABNORMAL HIGH (ref 0.50–1.35)

## 2012-03-29 ENCOUNTER — Ambulatory Visit: Payer: Medicare Other | Admitting: Cardiovascular Disease

## 2012-04-02 ENCOUNTER — Other Ambulatory Visit: Payer: Self-pay | Admitting: Endocrinology

## 2012-04-05 ENCOUNTER — Ambulatory Visit
Admission: RE | Admit: 2012-04-05 | Discharge: 2012-04-05 | Disposition: A | Discharge status: Home / Self Care | Payer: Medicare Other | Source: Ambulatory Visit | Attending: Radiology | Admitting: Radiology

## 2012-04-05 ENCOUNTER — Ambulatory Visit (HOSPITAL_COMMUNITY)
Admission: RE | Admit: 2012-04-05 | Discharge: 2012-04-05 | Disposition: A | Discharge status: Home / Self Care | Payer: Medicare Other | Source: Ambulatory Visit | Attending: Interventional Radiology | Admitting: Interventional Radiology

## 2012-04-05 VITALS — BP 137/71 | HR 67 | Temp 97.7°F | Resp 22

## 2012-04-05 DIAGNOSIS — M4120 Other idiopathic scoliosis, site unspecified: Secondary | ICD-10-CM | POA: Insufficient documentation

## 2012-04-05 DIAGNOSIS — K8689 Other specified diseases of pancreas: Secondary | ICD-10-CM | POA: Insufficient documentation

## 2012-04-05 DIAGNOSIS — N2889 Other specified disorders of kidney and ureter: Secondary | ICD-10-CM

## 2012-04-05 DIAGNOSIS — I517 Cardiomegaly: Secondary | ICD-10-CM | POA: Insufficient documentation

## 2012-04-05 DIAGNOSIS — N289 Disorder of kidney and ureter, unspecified: Secondary | ICD-10-CM | POA: Insufficient documentation

## 2012-04-05 MED ORDER — GADOBENATE DIMEGLUMINE 529 MG/ML IV SOLN
14.0000 mL | Freq: Once | INTRAVENOUS | Status: AC | PRN
  Administered 2012-04-05: 14 mL via INTRAVENOUS

## 2012-04-11 NOTE — Progress Notes (Signed)
Conrad ID: [name], male   DOB: 1931/03/14, 78 y.o.   MRN: [id number]  ESTABLISHED Conrad OFFICE VISIT  Chief Complaint: Status post percutaneous cryoablation of a left renal carcinoma on 03/02/2012.  History: [name] returns for initial follow-up and is 1 month status post ablation. Biopsy of Steve left renal mass revealed evidence of clear cell renal carcinoma, Fuhrman nuclear grade 2. Additional abdominal fat pad biopsy at that time, performed at Steve request of Dr. Clifton James, also shows evidence of amyloidosis. [name] has no complaints of pain, hematuria or dysuria. Chronic renal insufficiency is stable and has returned to baseline after slight increase in creatinine Steve day after Steve procedure to 1.95. Steve latest creatinine is 1.72.  Shortly after Steve ablation procedure, [name] did develop ischemia of Steve left lower extremity due to occlusion of Steve left limb of an aortobifemoral bypass graft. [name] underwent thrombectomy of Steve occluded bypass graft limb as well as common femoral and profunda femoral endarterectomy and patch angioplasty by Dr. Hart Rochester on 03/08/2012. He tolerated Steve procedure well and has been doing well postoperatively.  Review of Systems: See above.  Exam: Vital signs: Blood pressure 137/71, pulse 67, respirations 22, temperature 97.7, oxygen saturation 99% on room air. Abdomen: Soft and nontender. No flank tenderness.  Labs: BUN 39, Creatinine 1.72 on 03/15/2012.  Imaging: MRI of Steve abdomen was performed today and demonstrates cryoablation defect encompassing Steve left upper pole renal carcinoma without evidence of residual enhancing tumor. No complications are evident.  Assessment and Plan: [name] is doing well after recent percutaneous cryoablation of a biopsy proven left renal carcinoma. Renal function has returned to baseline. [name] is scheduled to follow up with Dr. Eliott Nine soon regarding chronic kidney disease. He  will also be following up with Dr. Clifton James regarding management of cardiac amyloidosis which is now biopsy proven after a positive fat pad biopsy. I will see [name] back in 5 months to reassess Steve ablation site.

## 2012-04-26 ENCOUNTER — Other Ambulatory Visit: Payer: Self-pay | Admitting: Endocrinology

## 2012-04-26 MED ORDER — ATENOLOL 25 MG PO TABS: 25.0000 mg | ORAL_TABLET | Freq: Every morning | ORAL | Status: DC

## 2012-05-04 ENCOUNTER — Encounter (INDEPENDENT_AMBULATORY_CARE_PROVIDER_SITE_OTHER): Payer: Medicare Other

## 2012-05-04 ENCOUNTER — Ambulatory Visit (INDEPENDENT_AMBULATORY_CARE_PROVIDER_SITE_OTHER): Payer: Medicare Other | Admitting: Endocrinology

## 2012-05-04 VITALS — BP 130/78 | HR 74 | Temp 97.8°F | Wt 155.0 lb

## 2012-05-04 DIAGNOSIS — N289 Disorder of kidney and ureter, unspecified: Secondary | ICD-10-CM

## 2012-05-04 DIAGNOSIS — N2889 Other specified disorders of kidney and ureter: Secondary | ICD-10-CM

## 2012-05-04 DIAGNOSIS — R600 Localized edema: Secondary | ICD-10-CM | POA: Insufficient documentation

## 2012-05-04 DIAGNOSIS — R609 Edema, unspecified: Secondary | ICD-10-CM

## 2012-05-04 DIAGNOSIS — E1049 Type 1 diabetes mellitus with other diabetic neurological complication: Secondary | ICD-10-CM

## 2012-05-04 MED ORDER — SILDENAFIL CITRATE 100 MG PO TABS: 100.0000 mg | ORAL_TABLET | Freq: Every day | ORAL | Status: DC | PRN

## 2012-05-04 NOTE — Patient Instructions (Addendum)
check your blood sugar twice a day.  vary the time of day when you check, between before the 3 meals, and at bedtime.  also check if you have symptoms of your blood sugar being too high or too low.  please keep a record of the readings and bring it to your next appointment here.  please call us sooner if your blood sugar goes below 70, or if it stays over 200.   Please come back for a follow-up appointment in 3 months.  blood tests are being requested for you today.  You will be contacted with results.   Please come back for a "medicare wellness" visit after 08/03/12.   Let's check for a blood clot today.

## 2012-05-04 NOTE — Progress Notes (Signed)
Subjective:    Patient ID: [name], male    DOB: 1931/01/05, 79 y.o.   MRN: [id number]  HPI Pt returns for f/u of insulin-requiring DM (dx'ed 1996, possibly due to pancreatic failure; complicated by CAD, peripheral sensory neuropathy, renal insufficiency, and PAD; he has done better on a simpler bid insulin regimen).  no cbg record, but states cbg's are well-controlled.   Pt states 2 weeks of slight swelling of the left leg, but no assoc pain. Past Medical History  Diagnosis Date  . Hypertension   . Anemia   . PAD (peripheral artery disease) 10/10    Angiogram  Dr Myra Gianotti , severe stenosis left profunda after insertion of aorto-bifemoral  bypass graft-total occulsionof left SFA. Collateral flow through the profunda femoral artery.  Marland Kitchen CAD in native artery 2003    by cath   . Hypercholesteremia   . Thyroid disease   . Thyroiditis, unspecified   . Osteoarthritis of spine   . Olecranon bursitis   . 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 GERD (gastroesophageal reflux disease)   . Diabetes mellitus   . History of colonic polyps   . Iron deficiency anemia, unspecified   . Leg pain   . Numbness   . History of colonoscopy   . Chronic kidney disease   . History of blood transfusion     Past Surgical History  Procedure Date  . Appendectomy   . Aortobifemoral bypass 2002  . Electrocardiogram 01/12/2006  . Stress cardiolite 04/14/2003  . Pr vein bypass graft,aorto-fem-pop 2002  . Renal artery stent 11/16/2010    Right renal artery  . Eye surgery     Cataract  . Embolectomy 03/08/2012    Procedure: EMBOLECTOMY;  Surgeon: Pryor Ochoa, MD;  Location: [facility] OR;  Service: Vascular;  Laterality: Left;  THROMBECTOMY OF LEFT LIMB OF AORTO-FEMORAL BYPASS GRAFT  . Patch angioplasty 03/08/2012    Procedure: PATCH ANGIOPLASTY;  Surgeon: Pryor Ochoa, MD;  Location: [facility] OR;  Service: Vascular;  Laterality: Left;  Left fropunda endarterectomy with patch angioplasty     History   Social History  . Marital Status: Married    Spouse Name: N/A    Number of Children: 2  . Years of Education: N/A   Occupational History  . Hydrologist     Retired   Social History Main Topics  . Smoking status: Current Every Day Smoker -- 0.5 packs/day for 60 years    Types: Cigarettes  . Smokeless tobacco: Never Used  . Alcohol Use: No  . Drug Use: No  . Sexually Active: No   Other Topics Concern  . 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 atenolol (TENORMIN) 25 MG tablet TAKE 1 TABLET DAILY  90 tablet  2  . atenolol (TENORMIN) 25 MG tablet Take 1 tablet (25 mg total) by mouth every morning.  30 tablet  3  . calcitRIOL (ROCALTROL) 0.5 MCG capsule Take 0.5 mcg by mouth daily.       Marland Kitchen dipyridamole-aspirin (AGGRENOX) 200-25 MG per 12 hr capsule Take 1 capsule by mouth 2 (two) times daily.      Marland Kitchen ezetimibe-simvastatin (VYTORIN) 10-40 MG per tablet Take 1 tablet by mouth every morning.      . ferrous sulfate 325 (65 FE) MG tablet  Take 325 mg by mouth 3 (three) times a week.      . hydrochlorothiazide (HYDRODIURIL) 25 MG tablet Take 25 mg by mouth every morning.      Marland Kitchen HYDROcodone-acetaminophen (NORCO) 10-325 MG per tablet Take 1 tablet by mouth every 4 (four) hours as needed. For pain  30 tablet  0  . insulin lispro protamine-insulin lispro (HUMALOG 75/25) (75-25) 100 UNIT/ML SUSP Inject 11-16 Units into the skin 2 (two) times daily with a meal. Inject into the skin 2 (two) times daily with a meal, 16 units with breakfast, and 11 units with the evening meal.      . ONE TOUCH ULTRA TEST test strip USE AS DIRECTED  100 each  5  . Vitamin D, Ergocalciferol, (DRISDOL) 50000 UNITS CAPS Take 50,000 Units by mouth 2 (two) times a week. On Tuesday and Saturday        Allergies   Allergen Reactions  . Clopidogrel Bisulfate Hives    Family History  Problem Relation Age of Onset  . Kidney disease Sister   . Heart attack Father   . Coronary artery disease Father   . Cancer Mother     BP 130/78  Pulse 74  Temp 97.8 F (36.6 C) (Oral)  Wt 155 lb (70.308 kg)  SpO2 96%  Review of Systems Denies chest pain, sob, and hypoglycemia    Objective:   Physical Exam VITAL SIGNS:  See vs page GENERAL: no distress Pulses: dorsalis pedis absent bilat.   Feet: no deformity.  no ulcer on the feet.  feet are of normal color and temp.  1+ left leg edema, but none on the right. Neuro: sensation is intact to touch on the feet, but decreased from normal.     Assessment & Plan:  Edema, new, high risk for DVT DM, apparently well-controlled

## 2012-05-11 ENCOUNTER — Telehealth: Payer: Self-pay | Admitting: Cardiovascular Disease

## 2012-05-11 MED ORDER — HYDROCHLOROTHIAZIDE 25 MG PO TABS: 25.0000 mg | ORAL_TABLET | Freq: Every morning | ORAL | Status: DC

## 2012-05-11 NOTE — Telephone Encounter (Signed)
No answer on home phone. Left message on cell phone to call back. Refill sent

## 2012-05-11 NOTE — Telephone Encounter (Signed)
Spoke with pt. He reports chest discomfort that comes and goes. Started about a week ago when he went to bed. Daily dose of aspirin helps. No pain at present time. Not related to exertion. Some shortness of breath with discomfort. Pt has appt with Dr. Clifton James on May 17, 2012.  He is aware to go to ED if pain worsens prior this appt.

## 2012-05-11 NOTE — Telephone Encounter (Signed)
Follow-up: ° ° ° °Patient called in returning your call.  Please call back. °

## 2012-05-11 NOTE — Telephone Encounter (Signed)
Pt needs a refill hydrochiorothiazide 25 mg QD [facility] on [address]   New Problem   Pt has been having some discomfort in his chest not really pain but pressure and he wants to let us know about it

## 2012-05-16 NOTE — Telephone Encounter (Signed)
Agree. Thanks, chris 

## 2012-05-17 ENCOUNTER — Ambulatory Visit (INDEPENDENT_AMBULATORY_CARE_PROVIDER_SITE_OTHER): Payer: Medicare Other | Admitting: Cardiovascular Disease

## 2012-05-17 ENCOUNTER — Encounter: Payer: Self-pay | Admitting: Cardiovascular Disease

## 2012-05-17 VITALS — BP 136/70 | HR 68 | Resp 18 | Ht 74.0 in | Wt 152.6 lb

## 2012-05-17 DIAGNOSIS — I2510 Atherosclerotic heart disease of native coronary artery without angina pectoris: Secondary | ICD-10-CM

## 2012-05-17 DIAGNOSIS — E859 Amyloidosis, unspecified: Secondary | ICD-10-CM

## 2012-05-17 DIAGNOSIS — I422 Other hypertrophic cardiomyopathy: Secondary | ICD-10-CM

## 2012-05-17 MED ORDER — HYDROCHLOROTHIAZIDE 25 MG PO TABS: 25.0000 mg | ORAL_TABLET | Freq: Every morning | ORAL | Status: DC

## 2012-05-17 NOTE — Progress Notes (Signed)
History of Present Illness: 79 yo AAM with h/o PVD, CAD, HTN, hyperlipidemia, DM and amyloidosis here for cardiac followup. His last cardiac cath was in October of 2003 and showed moderate disease. No coronary interventions were performed. He has a previous aortobifemoral bypass performed by Dr. Hart Rochester in 2002 with endarterectomy of the distal aorta as well as the right common femoral and distal external iliac artery and recent thrombectomy of left limb aortobifemoral bypass graft with profundoplasty for subacute occlusion left limb of aortobifemoral graft per Dr. Hart Rochester on 03/08/12. He was last seen in our office October 2013. He has been found to have a left renal mass and has had an ablation per Dr. Retta Mac. I arranged an echo and Lexiscan stress myoview. Stress test 01/09/12 without ischemia. Echo with normal LV function but there was severe left ventricular hypertrophy which was worrisome for infiltrative cardiomyopathy such as amyloidosis. There is grade 4 diastolic dysfunction with restrictive features. Cardiac MRI was arranged 01/23/12 and was consistent with amyloidosis. Fat pad biopsy confirmed amyloidosis.    He is here today for cardiac follow up. He is feeling well overall. No chest pain, SOB, palpitations, near syncope or syncope. He continues to smoke cigarettes. Overall doing well. Some swelling in his left leg. Lower ext venous doppler negative for DVT two weeks ago. No pain left leg.   Primary Care Physician: Romero Belling  Last Lipid Profile:Lipid Panel     Component Value Date/Time   CHOL 100 08/04/2011 1015   TRIG 56.0 08/04/2011 1015   HDL 45.80 08/04/2011 1015   CHOLHDL 2 08/04/2011 1015   VLDL 11.2 08/04/2011 1015   LDLCALC 43 08/04/2011 1015     Past Medical History  Diagnosis Date  . Hypertension   . Anemia   . PAD (peripheral artery disease) 10/10    Angiogram  Dr Myra Gianotti , severe stenosis left profunda after insertion of aorto-bifemoral  bypass graft-total occulsionof  left SFA. Collateral flow through the profunda femoral artery.  Marland Kitchen CAD in native artery 2003    by cath   . Hypercholesteremia   . Thyroid disease   . Thyroiditis, unspecified   . Osteoarthritis of spine   . Olecranon bursitis   . 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 GERD (gastroesophageal reflux disease)   . Diabetes mellitus   . History of colonic polyps   . Iron deficiency anemia, unspecified   . Leg pain   . Numbness   . History of colonoscopy   . Chronic kidney disease   . History of blood transfusion     Past Surgical History  Procedure Date  . Appendectomy   . Aortobifemoral bypass 2002  . Electrocardiogram 01/12/2006  . Stress cardiolite 04/14/2003  . Pr vein bypass graft,aorto-fem-pop 2002  . Renal artery stent 11/16/2010    Right renal artery  . Eye surgery     Cataract  . Embolectomy 03/08/2012    Procedure: EMBOLECTOMY;  Surgeon: Pryor Ochoa, MD;  Location: [facility];  Service: Vascular;  Laterality: Left;  THROMBECTOMY OF LEFT LIMB OF AORTO-FEMORAL BYPASS GRAFT  . Patch angioplasty 03/08/2012    Procedure: PATCH ANGIOPLASTY;  Surgeon: Pryor Ochoa, MD;  Location: [facility] OR;  Service: Vascular;  Laterality: Left;  Left fropunda endarterectomy with patch angioplasty    Current Outpatient Prescriptions  Medication Sig Dispense Refill  . atenolol (TENORMIN) 25 MG tablet TAKE 1 TABLET DAILY  90 tablet  2  . atenolol (TENORMIN) 25 MG tablet  Take 1 tablet (25 mg total) by mouth every morning.  30 tablet  3  . calcitRIOL (ROCALTROL) 0.5 MCG capsule Take 0.5 mcg by mouth daily.       Marland Kitchen dipyridamole-aspirin (AGGRENOX) 200-25 MG per 12 hr capsule Take 1 capsule by mouth 2 (two) times daily.      Marland Kitchen ezetimibe-simvastatin (VYTORIN) 10-40 MG per tablet Take 1 tablet by mouth every morning.      . ferrous sulfate 325 (65 FE) MG tablet Take 325 mg by mouth 3 (three) times a week.      . hydrochlorothiazide (HYDRODIURIL) 25 MG tablet Take 1 tablet (25 mg  total) by mouth every morning.  30 tablet  6  . HYDROcodone-acetaminophen (NORCO) 10-325 MG per tablet Take 1 tablet by mouth every 4 (four) hours as needed. For pain  30 tablet  0  . insulin lispro protamine-insulin lispro (HUMALOG 75/25) (75-25) 100 UNIT/ML SUSP Inject 11-16 Units into the skin 2 (two) times daily with a meal. Inject into the skin 2 (two) times daily with a meal, 16 units with breakfast, and 11 units with the evening meal.      . ONE TOUCH ULTRA TEST test strip USE AS DIRECTED  100 each  5  . sildenafil (VIAGRA) 100 MG tablet Take 1 tablet (100 mg total) by mouth daily as needed. For sexual activity  5 tablet  11  . Vitamin D, Ergocalciferol, (DRISDOL) 50000 UNITS CAPS Take 50,000 Units by mouth 2 (two) times a week. On Tuesday and Saturday        Allergies  Allergen Reactions  . Clopidogrel Bisulfate Hives    History   Social History  . Marital Status: Married    Spouse Name: N/A    Number of Children: 2  . Years of Education: N/A   Occupational History  . Hydrologist     Retired   Social History Main Topics  . Smoking status: Current Every Day Smoker -- 0.5 packs/day for 60 years    Types: Cigarettes  . Smokeless tobacco: Never Used  . Alcohol Use: No  . Drug Use: No  . Sexually Active: No   Other Topics Concern  . 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 Kidney disease Sister   . Heart attack Father   . Coronary artery disease Father   . Cancer Mother     Review of Systems:  As stated in the HPI and otherwise negative.   BP 136/70  Pulse 68  Resp 18  Ht 6\' 2"  (1.88 m)  Wt 152 lb 9.6 oz (69.219 kg)  BMI 19.59 kg/m2  SpO2 92%  Physical Examination: General: Well developed, well nourished, NAD HEENT: OP clear, mucus membranes moist SKIN: warm, dry. No rashes. Neuro: No focal  deficits Musculoskeletal: Muscle strength 5/5 all ext Psychiatric: Mood and affect normal Neck: No JVD, no carotid bruits, no thyromegaly, no lymphadenopathy. Lungs:Clear bilaterally, no wheezes, rhonci, crackles Cardiovascular: Regular rate and rhythm. No murmurs, gallops or rubs. Abdomen:Soft. Bowel sounds present. Non-tender.  Extremities: No lower extremity edema. Pulses are 2 + in the bilateral DP/PT.   Cardiac MRI: 01/23/12:  1. Normal LV size with moderate to severe LVH, somewhat asymmetric involvement of the septum compared to the lateral wall. No systolic anterior motion of the mitral valve. EF 44% with mild global hypokinesis, basal  inferior severe hypokinesis to akinesis.  2. Widespread delayed enhancement pattern that does not appear to be primarily in a coronary distribution, including involvement of the interatrial septum and the RV free wall. This raises concern for an infiltrative disease with cardiac amyloidosis being highest on the differential. The inferior wall motion abnormality in association with basal inferior/inferoseptal/inferolateral subendocardial delayed enhancement could be a coronary disease pattern (prior NSTEMI) but given overall picture it may simply be a manifestation of the infiltrative disease.  3. Needs full workup for cardiac amyloidosis, would start with serum/urine immunofixation and fat pad biopsy.  Assessment and Plan:  1. CAD: He is known to have moderately severe CAD with totally occluded RCA and moderate disease in LAD and Circumflex by cath in 2003. Stress test 01/09/12 without ischemia. Echo with normal LV function but there is severe left ventricular hypertrophy which appears to be consistent with an infiltrative cardiomyopathy such as amyloidosis. Cardiac MRI c/w amyloidosis confirmed by biopsy.  Continue beta blocker, statin, ASA.   2. Hypertrophic cardiomyopathy: Features c/w restrictive cardiomyopathy. Cardiac MRI c/w amyloidosis  confirmed by biopsy. I have discussed the etiology of this with the patient. Treatment options are limited. Will discuss with colleagues. May consider referral to [facility] for further evaluation but he has expressed no desire to be aggressive in further treatment.   3. Tobacco abuse: He is advised to stop smoking.

## 2012-05-17 NOTE — Patient Instructions (Addendum)
Your physician wants you to follow-up in: 3-4 months.   You will receive a reminder letter in the mail two months in advance. If you don't receive a letter, please call our office to schedule the follow-up appointment.  

## 2012-06-04 ENCOUNTER — Other Ambulatory Visit: Payer: Self-pay | Admitting: Endocrinology

## 2012-06-12 ENCOUNTER — Other Ambulatory Visit: Payer: Self-pay | Admitting: *Deleted

## 2012-06-12 NOTE — Telephone Encounter (Signed)
Patient request refill [facility] for BD PEN Needles. Form to be given to Dr. Everardo All.

## 2012-06-13 ENCOUNTER — Other Ambulatory Visit: Payer: Self-pay | Admitting: Endocrinology

## 2012-06-13 MED ORDER — INSULIN PEN NEEDLE 31G X 8 MM MISC: 1.0000 | Freq: Two times a day (BID) | Status: DC

## 2012-06-14 ENCOUNTER — Other Ambulatory Visit: Payer: Self-pay

## 2012-06-14 MED ORDER — INSULIN PEN NEEDLE 31G X 8 MM MISC: 1.0000 | Freq: Two times a day (BID) | Status: DC

## 2012-06-18 ENCOUNTER — Other Ambulatory Visit: Payer: Self-pay | Admitting: Endocrinology

## 2012-06-18 MED ORDER — INSULIN PEN NEEDLE 31G X 8 MM MISC: 1.0000 | Freq: Two times a day (BID) | Status: DC

## 2012-06-25 ENCOUNTER — Other Ambulatory Visit: Payer: Self-pay | Admitting: Endocrinology

## 2012-07-02 ENCOUNTER — Encounter: Payer: Self-pay | Admitting: Neurosurgery

## 2012-07-03 ENCOUNTER — Encounter (INDEPENDENT_AMBULATORY_CARE_PROVIDER_SITE_OTHER): Payer: Medicare Other | Admitting: *Deleted

## 2012-07-03 ENCOUNTER — Ambulatory Visit: Payer: Medicare Other | Admitting: Neurosurgery

## 2012-07-03 DIAGNOSIS — I701 Atherosclerosis of renal artery: Secondary | ICD-10-CM

## 2012-07-03 DIAGNOSIS — Z48812 Encounter for surgical aftercare following surgery on the circulatory system: Secondary | ICD-10-CM

## 2012-07-04 ENCOUNTER — Other Ambulatory Visit: Payer: Self-pay | Admitting: *Deleted

## 2012-07-04 DIAGNOSIS — I739 Peripheral vascular disease, unspecified: Secondary | ICD-10-CM

## 2012-07-04 DIAGNOSIS — Z48812 Encounter for surgical aftercare following surgery on the circulatory system: Secondary | ICD-10-CM

## 2012-07-10 ENCOUNTER — Encounter: Payer: Self-pay | Admitting: Vascular Surgery

## 2012-07-25 ENCOUNTER — Other Ambulatory Visit: Payer: Self-pay | Admitting: *Deleted

## 2012-07-25 MED ORDER — HYDROCODONE-ACETAMINOPHEN 10-325 MG PO TABS
1.0000 | ORAL_TABLET | ORAL | Status: DC | PRN
Start: 2012-07-25 — End: 2012-08-08

## 2012-07-31 ENCOUNTER — Other Ambulatory Visit (HOSPITAL_COMMUNITY): Payer: Self-pay | Admitting: Interventional Radiology

## 2012-07-31 DIAGNOSIS — N2889 Other specified disorders of kidney and ureter: Secondary | ICD-10-CM

## 2012-08-01 ENCOUNTER — Other Ambulatory Visit: Payer: Self-pay | Admitting: Emergency Medicine

## 2012-08-01 DIAGNOSIS — C642 Malignant neoplasm of left kidney, except renal pelvis: Secondary | ICD-10-CM

## 2012-08-07 ENCOUNTER — Telehealth: Payer: Self-pay | Admitting: Endocrinology

## 2012-08-07 NOTE — Telephone Encounter (Signed)
Pt left message requesting refill on Hydrocodone / Sherri S.

## 2012-08-08 MED ORDER — HYDROCODONE-ACETAMINOPHEN 10-325 MG PO TABS: 1.0000 | ORAL_TABLET | ORAL | Status: DC | PRN

## 2012-08-08 NOTE — Telephone Encounter (Signed)
i printed "medicare wellness" ov is due

## 2012-08-08 NOTE — Telephone Encounter (Signed)
Spoke w/ pt advised Dr. Everardo All is refilling pain meds. Pt scheduled for CPE in May / Sherri S.

## 2012-08-15 ENCOUNTER — Other Ambulatory Visit: Payer: Self-pay

## 2012-08-15 MED ORDER — HYDROCODONE-ACETAMINOPHEN 10-325 MG PO TABS
1.0000 | ORAL_TABLET | ORAL | Status: DC | PRN
Start: 2012-08-15 — End: 2012-08-23

## 2012-08-21 ENCOUNTER — Encounter: Payer: Self-pay | Admitting: Cardiovascular Disease

## 2012-08-21 ENCOUNTER — Ambulatory Visit (INDEPENDENT_AMBULATORY_CARE_PROVIDER_SITE_OTHER): Payer: Medicare Other | Admitting: Cardiovascular Disease

## 2012-08-21 VITALS — BP 124/72 | HR 64 | Ht 74.0 in | Wt 144.1 lb

## 2012-08-21 DIAGNOSIS — E639 Nutritional deficiency, unspecified: Secondary | ICD-10-CM

## 2012-08-21 DIAGNOSIS — I2510 Atherosclerotic heart disease of native coronary artery without angina pectoris: Secondary | ICD-10-CM

## 2012-08-21 DIAGNOSIS — E8589 Other amyloidosis: Secondary | ICD-10-CM

## 2012-08-21 DIAGNOSIS — Z720 Tobacco use: Secondary | ICD-10-CM

## 2012-08-21 DIAGNOSIS — E854 Organ-limited amyloidosis: Secondary | ICD-10-CM

## 2012-08-21 DIAGNOSIS — F17200 Nicotine dependence, unspecified, uncomplicated: Secondary | ICD-10-CM

## 2012-08-21 LAB — BUN: BUN: 33 mg/dL — ABNORMAL HIGH (ref 6–23)

## 2012-08-21 LAB — CREATININE WITH EST GFR
GFR, Est African American: 34 mL/min — ABNORMAL LOW
GFR, Est Non African American: 30 mL/min — ABNORMAL LOW

## 2012-08-21 NOTE — Patient Instructions (Addendum)
Your physician wants you to follow-up in:  6 months. You will receive a reminder letter in the mail two months in advance. If you don't receive a letter, please call our office to schedule the follow-up appointment.   

## 2012-08-21 NOTE — Progress Notes (Signed)
History of Present Illness: 79 yo AAM with h/o PVD, CAD, HTN, hyperlipidemia, DM and amyloidosis here for cardiac followup. His last cardiac cath was in October of 2003 and showed moderate disease. No coronary interventions were performed. He has a previous aortobifemoral bypass performed by Dr. Hart Rochester in 2002 with endarterectomy of the distal aorta as well as the right common femoral and distal external iliac artery and recent thrombectomy of left limb aortobifemoral bypass graft with profundoplasty for subacute occlusion left limb of aortobifemoral graft per Dr. Hart Rochester on 03/08/12. He was last seen in our office October 2013. He has been found to have a left renal mass and has had an ablation per Dr. Retta Mac. I arranged an echo and Lexiscan stress myoview. Stress test 01/09/12 without ischemia. Echo with normal LV function but there was severe left ventricular hypertrophy which was worrisome for infiltrative cardiomyopathy such as amyloidosis. There is grade 4 diastolic dysfunction with restrictive features. Cardiac MRI was arranged 01/23/12 and was consistent with amyloidosis. Fat pad biopsy confirmed amyloidosis.   He is here today for cardiac follow up. He is feeling well overall. No chest pain, SOB, palpitations, near syncope or syncope. He continues to smoke cigarettes. Some swelling in his left leg but resolves at night. Lower ext venous doppler negative for DVT earlier this year.    Primary Care Physician: Romero Belling  Last Lipid Profile:Lipid Panel     Component Value Date/Time   CHOL 100 08/04/2011 1015   TRIG 56.0 08/04/2011 1015   HDL 45.80 08/04/2011 1015   CHOLHDL 2 08/04/2011 1015   VLDL 11.2 08/04/2011 1015   LDLCALC 43 08/04/2011 1015     Past Medical History  Diagnosis Date  . Hypertension   . Anemia   . PAD (peripheral artery disease) 10/10    Angiogram  Dr Myra Gianotti , severe stenosis left profunda after insertion of aorto-bifemoral  bypass graft-total occulsionof left SFA.  Collateral flow through the profunda femoral artery.  Marland Kitchen CAD in native artery 2003    by cath   . Hypercholesteremia   . Thyroid disease   . Thyroiditis, unspecified   . Osteoarthritis of spine   . Olecranon bursitis   . 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 GERD (gastroesophageal reflux disease)   . Diabetes mellitus   . History of colonic polyps   . Iron deficiency anemia, unspecified   . Leg pain   . Numbness   . History of colonoscopy   . Chronic kidney disease   . History of blood transfusion     Past Surgical History  Procedure Laterality Date  . Appendectomy    . Aortobifemoral bypass  2002  . Electrocardiogram  01/12/2006  . Stress cardiolite  04/14/2003  . Pr vein bypass graft,aorto-fem-pop  2002  . Renal artery stent  11/16/2010    Right renal artery  . Eye surgery      Cataract  . Embolectomy  03/08/2012    Procedure: EMBOLECTOMY;  Surgeon: Pryor Ochoa, MD;  Location: [facility] OR;  Service: Vascular;  Laterality: Left;  THROMBECTOMY OF LEFT LIMB OF AORTO-FEMORAL BYPASS GRAFT  . Patch angioplasty  03/08/2012    Procedure: PATCH ANGIOPLASTY;  Surgeon: Pryor Ochoa, MD;  Location: [facility] OR;  Service: Vascular;  Laterality: Left;  Left fropunda endarterectomy with patch angioplasty    Current Outpatient Prescriptions  Medication Sig Dispense Refill  . AGGRENOX 25-200 MG per 12 hr capsule TAKE 1 CAPSULE TWICE A DAY  3 capsule  1  . atenolol (TENORMIN) 25 MG tablet Take 1 tablet (25 mg total) by mouth every morning.  30 tablet  3  . calcitRIOL (ROCALTROL) 0.5 MCG capsule Take 0.5 mcg by mouth daily.       Marland Kitchen dipyridamole-aspirin (AGGRENOX) 200-25 MG per 12 hr capsule Take 1 capsule by mouth 2 (two) times daily.      Marland Kitchen ezetimibe-simvastatin (VYTORIN) 10-40 MG per tablet Take 1 tablet by mouth every morning.      . ferrous sulfate 325 (65 FE) MG tablet Take 325 mg by mouth 3 (three) times a week.      . hydrochlorothiazide (HYDRODIURIL) 25 MG tablet  Take 1 tablet (25 mg total) by mouth every morning.  90 tablet  3  . HYDROcodone-acetaminophen (NORCO) 10-325 MG per tablet Take 1 tablet by mouth every 4 (four) hours as needed. For pain  30 tablet  0  . insulin lispro protamine-insulin lispro (HUMALOG 75/25) (75-25) 100 UNIT/ML SUSP Inject 11-16 Units into the skin 2 (two) times daily with a meal. Inject into the skin 2 (two) times daily with a meal, 16 units with breakfast, and 11 units with the evening meal.      . Insulin Pen Needle 31G X 8 MM MISC 1 Device by Does not apply route 2 (two) times daily.  180 each  3  . ONE TOUCH ULTRA TEST test strip USE AS DIRECTED  100 each  5  . sildenafil (VIAGRA) 100 MG tablet Take 1 tablet (100 mg total) by mouth daily as needed. For sexual activity  5 tablet  11  . VYTORIN 10-40 MG per tablet TAKE 1 TABLET DAILY  90 tablet  0   No current facility-administered medications for this visit.    Allergies  Allergen Reactions  . Clopidogrel Bisulfate Hives    History   Social History  . Marital Status: Married    Spouse Name: N/A    Number of Children: 2  . Years of Education: N/A   Occupational History  . Hydrologist     Retired   Social History Main Topics  . Smoking status: Current Every Day Smoker -- 0.50 packs/day for 60 years    Types: Cigarettes  . Smokeless tobacco: Never Used  . Alcohol Use: No  . Drug Use: No  . Sexually Active: No   Other Topics Concern  . Not on file   Social History Narrative   Occupation: Retired Scientist, water quality   2 children, 4 grandchildren   Works in yard frequently    Family History  Problem Relation Age of Onset  . Kidney disease Sister   . Heart attack Father   . Coronary artery disease Father   . Cancer Mother     Review of Systems:  As stated in the HPI and otherwise negative.   BP 124/72  Pulse 64  Ht 6\' 2"  (1.88 m)  Wt 144 lb 1.9 oz (65.372 kg)  BMI 18.5 kg/m2  Physical Examination: General: Well developed, well  nourished, NAD HEENT: OP clear, mucus membranes moist SKIN: warm, dry. No rashes. Neuro: No focal deficits Musculoskeletal: Muscle strength 5/5 all ext Psychiatric: Mood and affect normal Neck: No JVD, no carotid bruits, no thyromegaly, no lymphadenopathy. Lungs:Clear bilaterally, no wheezes, rhonci, crackles Cardiovascular: Regular rate and rhythm. No murmurs, gallops or rubs. Abdomen:Soft. Bowel sounds present. Non-tender.  Extremities: No lower extremity edema. Pulses are 2 + in the bilateral DP/PT.  Assessment and Plan:  1. CAD: He is known to have moderately severe CAD with totally occluded RCA and moderate disease in LAD and Circumflex by cath in 2003. Stress test 01/09/12 without ischemia. Echo with normal LV function but there is severe left ventricular hypertrophy which is c/w amyloidosis. Cardiac MRI c/w amyloidosis confirmed by fat pad biopsy. Continue beta blocker, statin, ASA.   2. Hypertrophic cardiomyopathy: Features c/w restrictive cardiomyopathy. Cardiac MRI c/w amyloidosis confirmed by biopsy. I have discussed the etiology of this with the patient. Treatment options are limited. We have talked about referral to a tertiary medical center for further discussion of options but he has expressed no desire to be aggressive in further treatment at this time.   3. Tobacco abuse: He is advised to stop smoking. He does not wish to stop.

## 2012-08-23 ENCOUNTER — Ambulatory Visit (INDEPENDENT_AMBULATORY_CARE_PROVIDER_SITE_OTHER): Payer: Medicare Other | Admitting: Endocrinology

## 2012-08-23 VITALS — BP 118/70 | HR 76 | Wt 147.0 lb

## 2012-08-23 DIAGNOSIS — K769 Liver disease, unspecified: Secondary | ICD-10-CM

## 2012-08-23 DIAGNOSIS — E7800 Pure hypercholesterolemia, unspecified: Secondary | ICD-10-CM

## 2012-08-23 DIAGNOSIS — I10 Essential (primary) hypertension: Secondary | ICD-10-CM

## 2012-08-23 DIAGNOSIS — Z125 Encounter for screening for malignant neoplasm of prostate: Secondary | ICD-10-CM

## 2012-08-23 DIAGNOSIS — D509 Iron deficiency anemia, unspecified: Secondary | ICD-10-CM

## 2012-08-23 DIAGNOSIS — N289 Disorder of kidney and ureter, unspecified: Secondary | ICD-10-CM

## 2012-08-23 DIAGNOSIS — E109 Type 1 diabetes mellitus without complications: Secondary | ICD-10-CM | POA: Insufficient documentation

## 2012-08-23 DIAGNOSIS — E069 Thyroiditis, unspecified: Secondary | ICD-10-CM

## 2012-08-23 DIAGNOSIS — E211 Secondary hyperparathyroidism, not elsewhere classified: Secondary | ICD-10-CM

## 2012-08-23 DIAGNOSIS — E1049 Type 1 diabetes mellitus with other diabetic neurological complication: Secondary | ICD-10-CM

## 2012-08-23 DIAGNOSIS — E876 Hypokalemia: Secondary | ICD-10-CM

## 2012-08-23 LAB — CBC WITH DIFFERENTIAL/PLATELET
Basophils Absolute: 0 10*3/uL (ref 0.0–0.1)
Eosinophils Absolute: 0.3 10*3/uL (ref 0.0–0.7)
Eosinophils Relative: 4.5 % (ref 0.0–5.0)
HCT: 39.3 % (ref 39.0–52.0)
Lymphs Abs: 1 10*3/uL (ref 0.7–4.0)
MCHC: 32.3 g/dL (ref 30.0–36.0)
MCV: 86.1 fl (ref 78.0–100.0)
Monocytes Absolute: 0.5 10*3/uL (ref 0.1–1.0)
Neutrophils Relative %: 70.4 % (ref 43.0–77.0)
Platelets: 112 10*3/uL — ABNORMAL LOW (ref 150.0–400.0)
RDW: 19 % — ABNORMAL HIGH (ref 11.5–14.6)

## 2012-08-23 LAB — LIPID PANEL
LDL Cholesterol: 36 mg/dL (ref 0–99)
Total CHOL/HDL Ratio: 2
Triglycerides: 53 mg/dL (ref 0.0–149.0)

## 2012-08-23 LAB — HEPATIC FUNCTION PANEL
ALT: 33 U/L (ref 0–53)
Alkaline Phosphatase: 102 U/L (ref 39–117)
Bilirubin, Direct: 0.1 mg/dL (ref 0.0–0.3)
Total Bilirubin: 0.8 mg/dL (ref 0.3–1.2)

## 2012-08-23 LAB — BASIC METABOLIC PANEL
Chloride: 114 mEq/L — ABNORMAL HIGH (ref 96–112)
Creatinine, Ser: 1.8 mg/dL — ABNORMAL HIGH (ref 0.4–1.5)
Sodium: 137 mEq/L (ref 135–145)

## 2012-08-23 LAB — URIC ACID: Uric Acid, Serum: 9.2 mg/dL — ABNORMAL HIGH (ref 4.0–7.8)

## 2012-08-23 LAB — IBC PANEL: Iron: 194 ug/dL — ABNORMAL HIGH (ref 42–165)

## 2012-08-23 LAB — HEMOGLOBIN A1C: Hgb A1c MFr Bld: 7.8 % — ABNORMAL HIGH (ref 4.6–6.5)

## 2012-08-23 MED ORDER — ASPIRIN-DIPYRIDAMOLE ER 25-200 MG PO CP12: 1.0000 | ORAL_CAPSULE | Freq: Two times a day (BID) | ORAL | Status: DC

## 2012-08-23 MED ORDER — HYDROCODONE-ACETAMINOPHEN 10-325 MG PO TABS: 1.0000 | ORAL_TABLET | ORAL | Status: DC | PRN

## 2012-08-23 MED ORDER — GLUCOSE BLOOD VI STRP: 1.0000 | ORAL_STRIP | Freq: Three times a day (TID) | Status: DC

## 2012-08-23 MED ORDER — EZETIMIBE-SIMVASTATIN 10-40 MG PO TABS: 1.0000 | ORAL_TABLET | Freq: Every morning | ORAL | Status: DC

## 2012-08-23 NOTE — Progress Notes (Signed)
Subjective:    Patient ID: [name], male    DOB: [birth year], 79 y.o.   MRN: [id number]  HPI The state of at least three ongoing medical problems is addressed today, with interval history of each noted here: Pt returns for f/u of insulin-requiring DM (dx'ed 1996, possibly due to pancreatic failure; complicated by CAD, peripheral sensory neuropathy, renal insufficiency, and PAD; he has done better on a simpler bid insulin regimen).  no cbg record, but states cbg's are well-controlled.  It is in general higher as the day goes on.  denies hypoglycemia HTN: he denies chest pain  Dyslipidemia: he denies SOB Past Medical History  Diagnosis Date  . Hypertension   . Anemia   . PAD (peripheral artery disease) 10/10    Angiogram  Dr Myra Gianotti , severe stenosis left profunda after insertion of aorto-bifemoral  bypass graft-total occulsionof left SFA. Collateral flow through the profunda femoral artery.  Marland Kitchen CAD in native artery 2003    by cath   . Hypercholesteremia   . Thyroid disease   . Thyroiditis, unspecified   . Osteoarthritis of spine   . Olecranon bursitis   . 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 GERD (gastroesophageal reflux disease)   . Diabetes mellitus   . History of colonic polyps   . Iron deficiency anemia, unspecified   . Leg pain   . Numbness   . History of colonoscopy   . Chronic kidney disease   . History of blood transfusion     Past Surgical History  Procedure Laterality Date  . Appendectomy    . Aortobifemoral bypass  2002  . Electrocardiogram  01/12/2006  . Stress cardiolite  04/14/2003  . Pr vein bypass graft,aorto-fem-pop  2002  . Renal artery stent  11/16/2010    Right renal artery  . Eye surgery      Cataract  . Embolectomy  03/08/2012    Procedure: EMBOLECTOMY;  Surgeon: Pryor Ochoa, MD;  Location: [facility] OR;  Service: Vascular;  Laterality: Left;  THROMBECTOMY OF LEFT LIMB OF AORTO-FEMORAL BYPASS GRAFT  . Patch angioplasty   03/08/2012    Procedure: PATCH ANGIOPLASTY;  Surgeon: Pryor Ochoa, MD;  Location: [facility] OR;  Service: Vascular;  Laterality: Left;  Left fropunda endarterectomy with patch angioplasty    History   Social History  . Marital Status: Married    Spouse Name: N/A    Number of Children: 2  . Years of Education: N/A   Occupational History  . Hydrologist     Retired   Social History Main Topics  . Smoking status: Current Every Day Smoker -- 0.50 packs/day for 60 years    Types: Cigarettes  . Smokeless tobacco: Never Used  . Alcohol Use: No  . Drug Use: No  . Sexually Active: No   Other Topics Concern  . Not on file   Social History Narrative   Occupation: Retired Scientist, water quality   2 children, 4 grandchildren   Works in yard frequently    Current Outpatient Prescriptions on File Prior to Visit  Medication Sig Dispense Refill  . atenolol (TENORMIN) 25 MG tablet Take 1 tablet (25 mg total) by mouth every morning.  30 tablet  3  . calcitRIOL (ROCALTROL) 0.5 MCG capsule Take 0.5 mcg by mouth daily.       . ferrous sulfate 325 (65 FE) MG tablet Take 325 mg by mouth 3 (three) times a week.      Marland Kitchen  hydrochlorothiazide (HYDRODIURIL) 25 MG tablet Take 1 tablet (25 mg total) by mouth every morning.  90 tablet  3  . insulin lispro protamine-insulin lispro (HUMALOG 75/25) (75-25) 100 UNIT/ML SUSP Inject 11-16 Units into the skin 2 (two) times daily with a meal. Inject into the skin 2 (two) times daily with a meal, 16 units with breakfast, and 11 units with the evening meal.      . Insulin Pen Needle 31G X 8 MM MISC 1 Device by Does not apply route 2 (two) times daily.  180 each  3  . sildenafil (VIAGRA) 100 MG tablet Take 1 tablet (100 mg total) by mouth daily as needed. For sexual activity  5 tablet  11   No current facility-administered medications on file prior to visit.    Allergies  Allergen Reactions  . Clopidogrel Bisulfate Hives    Family History  Problem Relation  Age of Onset  . Kidney disease Sister   . Heart attack Father   . Coronary artery disease Father   . Cancer Mother     BP 118/70  Pulse 76  Wt 147 lb (66.679 kg)  BMI 18.87 kg/m2  SpO2 97%    Review of Systems Denies brbpr and hematuria    Objective:   Physical Exam VITAL SIGNS:  See vs page GENERAL: no distress       Assessment & Plan:  DM: apparently well-controlled    Subjective:   Patient here for Medicare annual wellness visit and management of other chronic and acute problems.     Risk factors: advanced age    Roster of Physicians Providing Medical Care to Patient:  See "snapshot"   Activities of Daily Living: In your present state of health, do you have any difficulty performing the following activities?:  Preparing food and eating?: No  Bathing yourself: No  Getting dressed: No  Using the toilet:No  Moving around from place to place: No  In the past year have you fallen or had a near fall?: No    Home Safety: Has smoke detector and wears seat belts. No firearms.   Diet and Exercise  Current exercise habits:  Limited by health probs Dietary issues discussed: pt reports a healthy diet   Depression Screen  Q1: Over the past two weeks, have you felt down, depressed or hopeless? no  Q2: Over the past two weeks, have you felt little interest or pleasure in doing things? no   The following portions of the patient's history were reviewed and updated as appropriate: allergies, current medications, past family history, past medical history, past social history, past surgical history and problem list.  Past Medical History  Diagnosis Date  . Hypertension   . Anemia   . PAD (peripheral artery disease) 10/10    Angiogram  Dr Myra Gianotti , severe stenosis left profunda after insertion of aorto-bifemoral  bypass graft-total occulsionof left SFA. Collateral flow through the profunda femoral artery.  Marland Kitchen CAD in native artery 2003    by cath   . Hypercholesteremia    . Thyroid disease   . Thyroiditis, unspecified   . Osteoarthritis of spine   . Olecranon bursitis   . 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 GERD (gastroesophageal reflux disease)   . Diabetes mellitus   . History of colonic polyps   . Iron deficiency anemia, unspecified   . Leg pain   . Numbness   . History of colonoscopy   . Chronic kidney disease   .  History of blood transfusion     Past Surgical History  Procedure Laterality Date  . Appendectomy    . Aortobifemoral bypass  2002  . Electrocardiogram  01/12/2006  . Stress cardiolite  04/14/2003  . Pr vein bypass graft,aorto-fem-pop  2002  . Renal artery stent  11/16/2010    Right renal artery  . Eye surgery      Cataract  . Embolectomy  03/08/2012    Procedure: EMBOLECTOMY;  Surgeon: Pryor Ochoa, MD;  Location: [facility] OR;  Service: Vascular;  Laterality: Left;  THROMBECTOMY OF LEFT LIMB OF AORTO-FEMORAL BYPASS GRAFT  . Patch angioplasty  03/08/2012    Procedure: PATCH ANGIOPLASTY;  Surgeon: Pryor Ochoa, MD;  Location: [facility] OR;  Service: Vascular;  Laterality: Left;  Left fropunda endarterectomy with patch angioplasty    History   Social History  . Marital Status: Married    Spouse Name: N/A    Number of Children: 2  . Years of Education: N/A   Occupational History  . Hydrologist     Retired   Social History Main Topics  . Smoking status: Current Every Day Smoker -- 0.50 packs/day for 60 years    Types: Cigarettes  . Smokeless tobacco: Never Used  . Alcohol Use: No  . Drug Use: No  . Sexually Active: No   Other Topics Concern  . Not on file   Social History Narrative   Occupation: Retired Scientist, water quality   2 children, 4 grandchildren   Works in yard frequently    Current Outpatient Prescriptions on File Prior to Visit  Medication Sig Dispense Refill  . AGGRENOX 25-200 MG per 12 hr capsule TAKE 1 CAPSULE TWICE A DAY  3 capsule  1  . atenolol (TENORMIN) 25 MG tablet  Take 1 tablet (25 mg total) by mouth every morning.  30 tablet  3  . calcitRIOL (ROCALTROL) 0.5 MCG capsule Take 0.5 mcg by mouth daily.       Marland Kitchen dipyridamole-aspirin (AGGRENOX) 200-25 MG per 12 hr capsule Take 1 capsule by mouth 2 (two) times daily.      Marland Kitchen ezetimibe-simvastatin (VYTORIN) 10-40 MG per tablet Take 1 tablet by mouth every morning.      . ferrous sulfate 325 (65 FE) MG tablet Take 325 mg by mouth 3 (three) times a week.      . hydrochlorothiazide (HYDRODIURIL) 25 MG tablet Take 1 tablet (25 mg total) by mouth every morning.  90 tablet  3  . HYDROcodone-acetaminophen (NORCO) 10-325 MG per tablet Take 1 tablet by mouth every 4 (four) hours as needed. For pain  30 tablet  0  . insulin lispro protamine-insulin lispro (HUMALOG 75/25) (75-25) 100 UNIT/ML SUSP Inject 11-16 Units into the skin 2 (two) times daily with a meal. Inject into the skin 2 (two) times daily with a meal, 16 units with breakfast, and 11 units with the evening meal.      . Insulin Pen Needle 31G X 8 MM MISC 1 Device by Does not apply route 2 (two) times daily.  180 each  3  . ONE TOUCH ULTRA TEST test strip USE AS DIRECTED  100 each  5  . sildenafil (VIAGRA) 100 MG tablet Take 1 tablet (100 mg total) by mouth daily as needed. For sexual activity  5 tablet  11  . VYTORIN 10-40 MG per tablet TAKE 1 TABLET DAILY  90 tablet  0   No current facility-administered medications on file prior to visit.  Allergies  Allergen Reactions  . Clopidogrel Bisulfate Hives    Family History  Problem Relation Age of Onset  . Kidney disease Sister   . Heart attack Father   . Coronary artery disease Father   . Cancer Mother     BP 118/70  Pulse 76  Wt 147 lb (66.679 kg)  BMI 18.87 kg/m2  SpO2 97%   Review of Systems  Denies hearing loss, and visual loss Objective:   Vision:  Sees opthalmologist Hearing: grossly normal Body mass index:  See vs page Msk: pt easily and quickly performs "get-up-and-go" from a sitting  position, with the aid of a cane. Cognitive Impairment Assessment: cognition, memory and judgment appear normal.  remembers 1/3 at 5 minutes (? Effort).  Excellent recall.  can easily read and write a sentence.  alert and oriented x 3.     Assessment:   Medicare wellness utd on preventive parameters    Plan:   During the course of the visit the patient was educated and counseled about appropriate screening and preventive services including:        Fall prevention   Diabetes screening  Nutrition counseling   LABS PSA  Patient Instructions (the written plan) was given to the patient.  we discussed code status.  pt requests full code, but would not want to be started or maintained on artificial life-support measures if there was not a reasonable chance of recovery

## 2012-08-23 NOTE — Patient Instructions (Addendum)
please consider these measures for your health:  minimize alcohol.  do not use tobacco products.  have a colonoscopy at least every 10 years from age 79.  keep firearms safely stored.  always use seat belts.  have working smoke alarms in your home.  see an eye doctor and dentist regularly.  never drive under the influence of alcohol or drugs (including prescription drugs).  it is critically important to prevent falling down (keep floor areas well-lit, dry, and free of loose objects.  If you have a cane, walker, or wheelchair, you should use it, even for short trips around the house.  Also, try not to rush). good diet and exercise habits significanly improve the control of your diabetes.  please let me know if you wish to be referred to a dietician.  high blood sugar is very risky to your health.  you should see an eye doctor every year.  You are at higher than average risk for pneumonia and hepatitis-B.  You should be vaccinated against both.   Please come back for a follow-up appointment in 3 months.  blood tests are being requested for you today.  We'll contact you with results.   check your blood sugar 3 times a day.  vary the time of day when you check, between before the 3 meals, and at bedtime.  also check if you have symptoms of your blood sugar being too high or too low.  please keep a record of the readings and bring it to your next appointment here.  please call us sooner if your blood sugar goes below 70, or if you have a lot of readings over 200.

## 2012-08-24 LAB — PTH, INTACT AND CALCIUM: Calcium, Total (PTH): 8.9 mg/dL (ref 8.4–10.5)

## 2012-08-27 ENCOUNTER — Other Ambulatory Visit: Payer: Self-pay | Admitting: Emergency Medicine

## 2012-08-27 DIAGNOSIS — C642 Malignant neoplasm of left kidney, except renal pelvis: Secondary | ICD-10-CM

## 2012-08-28 ENCOUNTER — Ambulatory Visit (HOSPITAL_COMMUNITY): Admission: RE | Admit: 2012-08-28 | Payer: Medicare Other | Source: Ambulatory Visit

## 2012-08-28 ENCOUNTER — Other Ambulatory Visit: Payer: Medicare Other

## 2012-08-29 ENCOUNTER — Other Ambulatory Visit: Payer: Self-pay | Admitting: Endocrinology

## 2012-08-29 MED ORDER — HYDROCHLOROTHIAZIDE 25 MG PO TABS: 12.5000 mg | ORAL_TABLET | Freq: Every morning | ORAL | Status: DC

## 2012-08-30 ENCOUNTER — Other Ambulatory Visit: Payer: Self-pay | Admitting: Endocrinology

## 2012-08-30 MED ORDER — ATORVASTATIN CALCIUM 40 MG PO TABS: 40.0000 mg | ORAL_TABLET | Freq: Every day | ORAL | Status: DC

## 2012-09-10 LAB — CREATININE WITH EST GFR
Creat: 1.84 mg/dL — ABNORMAL HIGH (ref 0.50–1.35)
GFR, Est Non African American: 34 mL/min — ABNORMAL LOW

## 2012-09-10 LAB — BUN: BUN: 33 mg/dL — ABNORMAL HIGH (ref 6–23)

## 2012-10-09 ENCOUNTER — Ambulatory Visit (HOSPITAL_COMMUNITY)
Admission: RE | Admit: 2012-10-09 | Discharge: 2012-10-09 | Disposition: A | Discharge status: Home / Self Care | Payer: Medicare Other | Source: Ambulatory Visit | Attending: Interventional Radiology | Admitting: Interventional Radiology

## 2012-10-09 DIAGNOSIS — K8020 Calculus of gallbladder without cholecystitis without obstruction: Secondary | ICD-10-CM | POA: Insufficient documentation

## 2012-10-09 DIAGNOSIS — N289 Disorder of kidney and ureter, unspecified: Secondary | ICD-10-CM | POA: Insufficient documentation

## 2012-10-09 DIAGNOSIS — N2889 Other specified disorders of kidney and ureter: Secondary | ICD-10-CM

## 2012-10-09 DIAGNOSIS — K861 Other chronic pancreatitis: Secondary | ICD-10-CM | POA: Insufficient documentation

## 2012-10-10 ENCOUNTER — Ambulatory Visit
Admission: RE | Admit: 2012-10-10 | Discharge: 2012-10-10 | Disposition: A | Discharge status: Home / Self Care | Payer: Medicare Other | Source: Ambulatory Visit | Attending: Interventional Radiology | Admitting: Interventional Radiology

## 2012-10-10 DIAGNOSIS — N2889 Other specified disorders of kidney and ureter: Secondary | ICD-10-CM

## 2012-10-10 NOTE — Progress Notes (Signed)
Denies hematuria or any other problems w/ urination.  Denies discomfort associated w/ cryoablation.  Next appointment w/ Dr Mena Goes for August 2014.    No complaints associated w/ cryoablation.   Quintavia Rogstad Carmell [place], [place] 10/10/2012 10:46 AM

## 2012-10-10 NOTE — Progress Notes (Signed)
Patient ID: [name], male   DOB: 1930-08-05, 79 y.o.   MRN: [id number]  ESTABLISHED PATIENT OFFICE VISIT  Chief Complaint: Status post percutaneous ablation of a left clear cell renal carcinoma on 03/02/2012.  History: Mr. Wamser is doing well after ablation with no urinary symptoms or pain. He is scheduled to follow up with Dr. Mena Goes in August. Is only complaint today is some worsening of chronic low back pain.  Review of Systems: No fever or chills. No hematuria or dysuria. No abdominal or flank pain. No nausea or vomiting.  Exam: Vital signs: Blood pressure 133/72, pulse 51, respirations 14, temperature 97.8, oxygen saturation one under percent on room air. General: No acute distress. Abdomen: Soft and nontender. No flank tenderness is elicited.  Labs: BUN 33, creatinine 1.84 and estimated GFR 39 ml/minute on 09/10/2012.  Imaging: MRI of the abdomen was performed without contrast on 10/09/2012. This demonstrates decrease in size of the ablation scar tissue at the level of the treated left renal carcinoma. Gadolinium could not be administered to evaluate enhancement due to chronic renal insufficiency.  Assessment and Plan: Mr. Varnell is doing well post cryoablation of a left renal cell carcinoma. Unenhanced MRI shows no evidence of enlarging tissue at the level of ablation. I have recommended another follow-up in 1 year. Given persistence of chronic renal insufficiency which was also present prior to the procedure, we may have to continue to evaluate the ablation site with unenhanced MRI. Although not as sensitive, this should be adequate to continue to monitor the ablation site for any changes.

## 2012-10-22 ENCOUNTER — Other Ambulatory Visit: Payer: Self-pay | Admitting: Endocrinology

## 2012-10-24 ENCOUNTER — Telehealth: Payer: Self-pay | Admitting: Cardiovascular Disease

## 2012-10-24 ENCOUNTER — Telehealth: Payer: Self-pay

## 2012-10-24 DIAGNOSIS — R609 Edema, unspecified: Secondary | ICD-10-CM

## 2012-10-24 NOTE — Telephone Encounter (Signed)
Left message to call back  

## 2012-10-24 NOTE — Telephone Encounter (Signed)
Pt. Called to report swelling in his legs with left greater than right.  States has been going on for approx. 1 mo.  Denies any redness, inflammation, or pain.  States the "swelling is in the lower legs, and does go down some at night."  Asking about medication for the swelling.  Denies SOB with activity.  States has noticed he "breathes heavier at night".  Advised to contact Dr. Clifton James regarding swelling of legs.  Verb. Understanding.

## 2012-10-24 NOTE — Telephone Encounter (Signed)
New problem   C/O swelling in left knee down to foot.  Please advise on medication.

## 2012-10-29 NOTE — Telephone Encounter (Signed)
Spoke with pt. He reports swelling in left foot since operation in November 2013. This occurs off and on. He now reports swelling in left lower leg below knee for last month. No pain but feels tight. Swelling will sometimes go down with elevation of leg. Some shortness of breath when lying flat but this has not increased lately. He checked bottle of HCTZ for me and reports bottle states to take half tablet daily but he has been taking whole 25 mg tablet HCTZ daily.

## 2012-10-29 NOTE — Telephone Encounter (Signed)
Spoke with pt. He will come in for venous doppler on October 30, 2012 at 9:00 and appt with Dr. Clifton James on November 01, 2012 at 8:30

## 2012-10-29 NOTE — Telephone Encounter (Signed)
Pat, Can we schedule him for a venous doppler of the left leg? I will need to try and find a place to see him on the schedule. Thayer Ohm

## 2012-10-30 ENCOUNTER — Encounter (INDEPENDENT_AMBULATORY_CARE_PROVIDER_SITE_OTHER): Payer: Medicare Other

## 2012-10-30 ENCOUNTER — Encounter: Payer: Self-pay | Admitting: Endocrinology

## 2012-10-30 DIAGNOSIS — R609 Edema, unspecified: Secondary | ICD-10-CM

## 2012-10-30 DIAGNOSIS — M7989 Other specified soft tissue disorders: Secondary | ICD-10-CM

## 2012-10-31 NOTE — Telephone Encounter (Signed)
please call patient: i assume this applies to the may, 2014 labs:  please call patient: Iron is too high. Please d/c iron pill Blood sugar is good. Cholesterol is really good. Please let me know if you want to change to a cheaper generic pill. Please reduce the hctz to 1/2 pill per day. Kidneys are slightly better.

## 2012-11-01 ENCOUNTER — Encounter: Payer: Self-pay | Admitting: Cardiovascular Disease

## 2012-11-01 ENCOUNTER — Ambulatory Visit (INDEPENDENT_AMBULATORY_CARE_PROVIDER_SITE_OTHER): Payer: Medicare Other | Admitting: Cardiovascular Disease

## 2012-11-01 VITALS — BP 140/82 | HR 72 | Wt 147.0 lb

## 2012-11-01 DIAGNOSIS — M7989 Other specified soft tissue disorders: Secondary | ICD-10-CM

## 2012-11-01 MED ORDER — FUROSEMIDE 20 MG PO TABS: 20.0000 mg | ORAL_TABLET | Freq: Every day | ORAL | Status: DC

## 2012-11-01 NOTE — Patient Instructions (Addendum)
Your physician recommends that you schedule a follow-up appointment in:  6 weeks.   Your physician recommends that you return for lab work on November 08, 2012. The lab opens at 7:30 AM    Your physician has recommended you make the following change in your medication:  Start furosemide 20 mg by mouth daily

## 2012-11-01 NOTE — Progress Notes (Signed)
History of Present Illness: 79 yo AAM with h/o PVD, CAD, HTN, hyperlipidemia, DM and LVH with confirmed amyloidosis here for cardiac followup. His last cardiac cath was in October of 2003 and showed moderate disease. No coronary interventions were performed. He has a previous aortobifemoral bypass performed by Dr. Hart Rochester in 2002 with endarterectomy of the distal aorta as well as the right common femoral and distal external iliac artery and recent  thrombectomy of left limb aortobifemoral bypass graft with profundoplasty for subacute occlusion left limb of aortobifemoral graft per Dr. Hart Rochester on 03/08/12.  He has been found to have a left renal mass and has had an ablation per Dr. Retta Mac 11/0/13. I arranged an echo and Lexiscan stress myoview before his ablation procedure. Stress test 01/09/12 without ischemia. Echo with normal LV function but there was severe left ventricular hypertrophy which was worrisome for infiltrative cardiomyopathy such as amyloidosis. There is grade 4 diastolic dysfunction with restrictive features. Cardiac MRI was arranged 01/23/12 and was consistent with amyloidosis. Fat pad biopsy confirmed amyloidosis. We have had discussions regarding cardiac amyloidosis and he has wished not to pursue any therapy or referral to other medical centers for evaluation.   He called out office this week with c/o isolated left leg swelling on/off for 9  Months, worsened over last week. We arranged venous dopplers on 10/30/12 without evidence of DVT. He also has some swelling in right leg. Resolves some at night and when elevating legs. No pain in legs.   Primary Care Physician: Romero Belling  Last Lipid Profile:Lipid Panel     Component Value Date/Time   CHOL 95 08/23/2012 1027   TRIG 53.0 08/23/2012 1027   HDL 48.90 08/23/2012 1027   CHOLHDL 2 08/23/2012 1027   VLDL 10.6 08/23/2012 1027   LDLCALC 36 08/23/2012 1027     Past Medical History  Diagnosis Date  . Hypertension   . Anemia   . PAD  (peripheral artery disease) 10/10    Angiogram  Dr Myra Gianotti , severe stenosis left profunda after insertion of aorto-bifemoral  bypass graft-total occulsionof left SFA. Collateral flow through the profunda femoral artery.  Marland Kitchen CAD in native artery 2003    by cath   . Hypercholesteremia   . Thyroid disease   . Thyroiditis, unspecified   . Osteoarthritis of spine   . Olecranon bursitis   . 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 GERD (gastroesophageal reflux disease)   . Diabetes mellitus   . History of colonic polyps   . Iron deficiency anemia, unspecified   . Leg pain   . Numbness   . History of colonoscopy   . Chronic kidney disease   . History of blood transfusion     Past Surgical History  Procedure Laterality Date  . Appendectomy    . Aortobifemoral bypass  2002  . Electrocardiogram  01/12/2006  . Stress cardiolite  04/14/2003  . Pr vein bypass graft,aorto-fem-pop  2002  . Renal artery stent  11/16/2010    Right renal artery  . Eye surgery      Cataract  . Embolectomy  03/08/2012    Procedure: EMBOLECTOMY;  Surgeon: Pryor Ochoa, MD;  Location: [facility] OR;  Service: Vascular;  Laterality: Left;  THROMBECTOMY OF LEFT LIMB OF AORTO-FEMORAL BYPASS GRAFT  . Patch angioplasty  03/08/2012    Procedure: PATCH ANGIOPLASTY;  Surgeon: Pryor Ochoa, MD;  Location: [facility] OR;  Service: Vascular;  Laterality: Left;  Left fropunda endarterectomy with patch  angioplasty    Current Outpatient Prescriptions  Medication Sig Dispense Refill  . atenolol (TENORMIN) 25 MG tablet Take 1 tablet (25 mg total) by mouth every morning.  30 tablet  3  . atorvastatin (LIPITOR) 40 MG tablet Take 1 tablet (40 mg total) by mouth daily.  90 tablet  3  . calcitRIOL (ROCALTROL) 0.5 MCG capsule Take 0.5 mcg by mouth daily.       Marland Kitchen dipyridamole-aspirin (AGGRENOX) 200-25 MG per 12 hr capsule Take 1 capsule by mouth 2 (two) times daily.  180 capsule  3  . glucose blood (ONE TOUCH ULTRA TEST) test  strip 1 each by Other route 3 (three) times daily. And lancets 3/day 250.01  270 each  12  . HUMALOG MIX 75/25 KWIKPEN (75-25) 100 UNIT/ML SUPN INJECT 16 UNITS WITH BREAKFAST AND 10 UNITS WITH EVENING MEAL  3 mL  6  . hydrochlorothiazide (HYDRODIURIL) 25 MG tablet Take 25 mg by mouth every morning.      Marland Kitchen HYDROcodone-acetaminophen (NORCO) 10-325 MG per tablet Take 1 tablet by mouth every 4 (four) hours as needed. For pain  100 tablet  1  . Insulin Pen Needle 31G X 8 MM MISC 1 Device by Does not apply route 2 (two) times daily.  180 each  3  . sildenafil (VIAGRA) 100 MG tablet Take 1 tablet (100 mg total) by mouth daily as needed. For sexual activity  5 tablet  11   No current facility-administered medications for this visit.    Allergies  Allergen Reactions  . Clopidogrel Bisulfate Hives    History   Social History  . Marital Status: Married    Spouse Name: N/A    Number of Children: 2  . Years of Education: N/A   Occupational History  . Hydrologist     Retired   Social History Main Topics  . Smoking status: Current Every Day Smoker -- 0.50 packs/day for 60 years    Types: Cigarettes  . Smokeless tobacco: Never Used  . Alcohol Use: No  . Drug Use: No  . Sexually Active: No   Other Topics Concern  . Not on file   Social History Narrative   Occupation: Retired Scientist, water quality   2 children, 4 grandchildren   Works in yard frequently    Family History  Problem Relation Age of Onset  . Kidney disease Sister   . Heart attack Father   . Coronary artery disease Father   . Cancer Mother     Review of Systems:  As stated in the HPI and otherwise negative.   BP 140/82  Pulse 72  Wt 147 lb (66.679 kg)  BMI 18.87 kg/m2  Physical Examination: General: Well developed, well nourished, NAD HEENT: OP clear, mucus membranes moist SKIN: warm, dry. No rashes. Neuro: No focal deficits Musculoskeletal: Muscle strength 5/5 all ext Psychiatric: Mood and affect  normal Neck: No JVD, no carotid bruits, no thyromegaly, no lymphadenopathy. Lungs:Clear bilaterally, no wheezes, rhonci, crackles Cardiovascular: Regular rate and rhythm. No murmurs, gallops or rubs. Abdomen:Soft. Bowel sounds present. Non-tender.  Extremities: 1-2+ left lower extremity edema. Trace to 1+ right lower ext edema.   Assessment and Plan:   1. Left leg swelling: This has been present since the thrombectomy of left limb of aortofemoral bypass in November 2013. MRI of abdomen October 10, 2012 without any obvious masses. He also has some swelling in his right leg. I think his swelling is related to his diastolic dysfunction and likely more  severe in the left leg because of his surgical procedure in November. Will attempt gentle diuresis with Lasix 20 mg po Qdaily. Will need repeat BMET next week to check renal function. He has stage III CKD. No plans for repeat imaging of abdomen/pelvis at this time as he has had extensive imaging over last year. Elevate legs when sitting.

## 2012-11-08 ENCOUNTER — Other Ambulatory Visit: Payer: Self-pay | Admitting: *Deleted

## 2012-11-08 ENCOUNTER — Other Ambulatory Visit (INDEPENDENT_AMBULATORY_CARE_PROVIDER_SITE_OTHER): Payer: Medicare Other

## 2012-11-08 DIAGNOSIS — M7989 Other specified soft tissue disorders: Secondary | ICD-10-CM

## 2012-11-08 DIAGNOSIS — R609 Edema, unspecified: Secondary | ICD-10-CM

## 2012-11-08 LAB — BASIC METABOLIC PANEL
CO2: 19 mEq/L (ref 19–32)
Chloride: 112 mEq/L (ref 96–112)
Potassium: 4.6 mEq/L (ref 3.5–5.1)
Sodium: 141 mEq/L (ref 135–145)

## 2012-11-15 ENCOUNTER — Other Ambulatory Visit (INDEPENDENT_AMBULATORY_CARE_PROVIDER_SITE_OTHER): Payer: Medicare Other

## 2012-11-15 DIAGNOSIS — R609 Edema, unspecified: Secondary | ICD-10-CM

## 2012-11-15 LAB — BASIC METABOLIC PANEL
BUN: 47 mg/dL — ABNORMAL HIGH (ref 6–23)
Chloride: 115 mEq/L — ABNORMAL HIGH (ref 96–112)
GFR: 30.97 mL/min — ABNORMAL LOW (ref >60.00)
Glucose, Bld: 125 mg/dL — ABNORMAL HIGH (ref 70–99)
Potassium: 4.1 mEq/L (ref 3.5–5.1)
Sodium: 140 mEq/L (ref 135–145)

## 2012-11-20 ENCOUNTER — Other Ambulatory Visit: Payer: Self-pay | Admitting: *Deleted

## 2012-11-20 DIAGNOSIS — N289 Disorder of kidney and ureter, unspecified: Secondary | ICD-10-CM

## 2012-11-28 ENCOUNTER — Other Ambulatory Visit: Payer: Self-pay

## 2012-11-29 ENCOUNTER — Other Ambulatory Visit (INDEPENDENT_AMBULATORY_CARE_PROVIDER_SITE_OTHER): Payer: Medicare Other

## 2012-11-29 DIAGNOSIS — N289 Disorder of kidney and ureter, unspecified: Secondary | ICD-10-CM

## 2012-11-29 LAB — BASIC METABOLIC PANEL
CO2: 20 mEq/L (ref 19–32)
Calcium: 8.5 mg/dL (ref 8.4–10.5)
Chloride: 112 mEq/L (ref 96–112)
Creatinine, Ser: 2.5 mg/dL — ABNORMAL HIGH (ref 0.4–1.5)
Glucose, Bld: 160 mg/dL — ABNORMAL HIGH (ref 70–99)

## 2012-12-04 ENCOUNTER — Other Ambulatory Visit: Payer: Self-pay | Admitting: Endocrinology

## 2012-12-10 ENCOUNTER — Other Ambulatory Visit: Payer: Self-pay | Admitting: Endocrinology

## 2012-12-10 ENCOUNTER — Other Ambulatory Visit: Payer: Self-pay

## 2012-12-12 ENCOUNTER — Telehealth: Payer: Self-pay

## 2012-12-12 NOTE — Telephone Encounter (Signed)
Pt left voice mail stating he has bilateral swelling in his legs, please advise

## 2012-12-12 NOTE — Telephone Encounter (Signed)
Please advise ov thurs

## 2012-12-13 ENCOUNTER — Ambulatory Visit (INDEPENDENT_AMBULATORY_CARE_PROVIDER_SITE_OTHER): Payer: Medicare Other | Admitting: Endocrinology

## 2012-12-13 ENCOUNTER — Encounter: Payer: Self-pay | Admitting: Endocrinology

## 2012-12-13 VITALS — BP 128/70 | HR 78 | Ht 74.0 in | Wt 155.0 lb

## 2012-12-13 DIAGNOSIS — E1049 Type 1 diabetes mellitus with other diabetic neurological complication: Secondary | ICD-10-CM

## 2012-12-13 DIAGNOSIS — D509 Iron deficiency anemia, unspecified: Secondary | ICD-10-CM

## 2012-12-13 DIAGNOSIS — R600 Localized edema: Secondary | ICD-10-CM

## 2012-12-13 DIAGNOSIS — R609 Edema, unspecified: Secondary | ICD-10-CM

## 2012-12-13 DIAGNOSIS — N289 Disorder of kidney and ureter, unspecified: Secondary | ICD-10-CM

## 2012-12-13 LAB — CBC WITH DIFFERENTIAL/PLATELET
Basophils Absolute: 0 10*3/uL (ref 0.0–0.1)
Eosinophils Relative: 3.6 % (ref 0.0–5.0)
MCV: 85.1 fl (ref 78.0–100.0)
Monocytes Absolute: 0.5 10*3/uL (ref 0.1–1.0)
Monocytes Relative: 9.4 % (ref 3.0–12.0)
Neutrophils Relative %: 70.2 % (ref 43.0–77.0)
Platelets: 117 10*3/uL — ABNORMAL LOW (ref 150.0–400.0)
RDW: 17.4 % — ABNORMAL HIGH (ref 11.5–14.6)
WBC: 5.4 10*3/uL (ref 4.5–10.5)

## 2012-12-13 LAB — IBC PANEL
Iron: 31 ug/dL — ABNORMAL LOW (ref 42–165)
Transferrin: 241.2 mg/dL (ref 212.0–360.0)

## 2012-12-13 LAB — HEMOGLOBIN A1C: Hgb A1c MFr Bld: 8.2 % — ABNORMAL HIGH (ref 4.6–6.5)

## 2012-12-13 LAB — BASIC METABOLIC PANEL
BUN: 47 mg/dL — ABNORMAL HIGH (ref 6–23)
Calcium: 8.8 mg/dL (ref 8.4–10.5)
Creatinine, Ser: 2.5 mg/dL — ABNORMAL HIGH (ref 0.4–1.5)

## 2012-12-13 NOTE — Telephone Encounter (Signed)
Per jan pt to come in at 67 today

## 2012-12-13 NOTE — Progress Notes (Signed)
Subjective:    Patient ID: [name], male    DOB: January 12, 1931, 79 y.o.   MRN: [id number]  HPI Pt states few mos of slight swelling of the legs, but no assoc pain.   His creat worsened on lasix, so it had to be stopped.   Past Medical History  Diagnosis Date  . Hypertension   . Anemia   . PAD (peripheral artery disease) 10/10    Angiogram  Dr Myra Gianotti , severe stenosis left profunda after insertion of aorto-bifemoral  bypass graft-total occulsionof left SFA. Collateral flow through the profunda femoral artery.  Marland Kitchen CAD in native artery 2003    by cath   . Hypercholesteremia   . Thyroid disease   . Thyroiditis, unspecified   . Osteoarthritis of spine   . Olecranon bursitis   . 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 GERD (gastroesophageal reflux disease)   . Diabetes mellitus   . History of colonic polyps   . Iron deficiency anemia, unspecified   . Leg pain   . Numbness   . History of colonoscopy   . Chronic kidney disease   . History of blood transfusion     Past Surgical History  Procedure Laterality Date  . Appendectomy    . Aortobifemoral bypass  2002  . Electrocardiogram  01/12/2006  . Stress cardiolite  04/14/2003  . Pr vein bypass graft,aorto-fem-pop  2002  . Renal artery stent  11/16/2010    Right renal artery  . Eye surgery      Cataract  . Embolectomy  03/08/2012    Procedure: EMBOLECTOMY;  Surgeon: Pryor Ochoa, MD;  Location: [facility] OR;  Service: Vascular;  Laterality: Left;  THROMBECTOMY OF LEFT LIMB OF AORTO-FEMORAL BYPASS GRAFT  . Patch angioplasty  03/08/2012    Procedure: PATCH ANGIOPLASTY;  Surgeon: Pryor Ochoa, MD;  Location: [facility] OR;  Service: Vascular;  Laterality: Left;  Left fropunda endarterectomy with patch angioplasty    History   Social History  . Marital Status: Married    Spouse Name: N/A    Number of Children: 2  . Years of Education: N/A   Occupational History  . Hydrologist     Retired    Social History Main Topics  . Smoking status: Current Every Day Smoker -- 0.50 packs/day for 60 years    Types: Cigarettes  . Smokeless tobacco: Never Used  . Alcohol Use: No  . Drug Use: No  . Sexual Activity: No   Other Topics Concern  . Not on file   Social History Narrative   Occupation: Retired Scientist, water quality   2 children, 4 grandchildren   Works in yard frequently    Current Outpatient Prescriptions on File Prior to Visit  Medication Sig Dispense Refill  . atenolol (TENORMIN) 25 MG tablet Take 1 tablet (25 mg total) by mouth every morning.  30 tablet  3  . atorvastatin (LIPITOR) 40 MG tablet Take 1 tablet (40 mg total) by mouth daily.  90 tablet  3  . calcitRIOL (ROCALTROL) 0.5 MCG capsule Take 0.5 mcg by mouth daily.       Marland Kitchen dipyridamole-aspirin (AGGRENOX) 200-25 MG per 12 hr capsule Take 1 capsule by mouth 2 (two) times daily.  180 capsule  3  . glucose blood (ONE TOUCH ULTRA TEST) test strip 1 each by Other route 3 (three) times daily. And lancets 3/day 250.01  270 each  12  . hydrochlorothiazide (HYDRODIURIL) 25 MG tablet Take 25  mg by mouth every morning.      Marland Kitchen HYDROcodone-acetaminophen (NORCO) 10-325 MG per tablet TAKE 1 TABLET BY MOUTH EVERY 4 HOURS AS NEEDED FOR PAIN  100 tablet  1  . Insulin Pen Needle 31G X 8 MM MISC 1 Device by Does not apply route 2 (two) times daily.  180 each  3  . sildenafil (VIAGRA) 100 MG tablet Take 1 tablet (100 mg total) by mouth daily as needed. For sexual activity  5 tablet  11   No current facility-administered medications on file prior to visit.    Allergies  Allergen Reactions  . Clopidogrel Bisulfate Hives    Family History  Problem Relation Age of Onset  . Kidney disease Sister   . Heart attack Father   . Coronary artery disease Father   . Cancer Mother     BP 128/70  Pulse 78  Ht 6\' 2"  (1.88 m)  Wt 155 lb (70.308 kg)  BMI 19.89 kg/m2  SpO2 97%  Review of Systems Denies sob and n/v    Objective:   Physical  Exam VITAL SIGNS:  See vs page GENERAL: no distress Ext: 2+ left leg edema, and 1+ on the right.    Lab Results  Component Value Date   CREATININE 2.5* 12/13/2012   BUN 47* 12/13/2012   NA 139 12/13/2012   K 4.3 12/13/2012   CL 112 12/13/2012   CO2 19 12/13/2012      Assessment & Plan:  Edema, recurrent Renal failure.  This limits diuretic rx DM: he needs increased rx

## 2012-12-13 NOTE — Patient Instructions (Addendum)
You swelling will get better if you elevate your feet above the rest of your body.   Here is a prescription for compression stockings.   Please come back for a follow-up appointment in 3 months.  blood tests are being requested for you today.  We'll contact you with results.   check your blood sugar 3 times a day.  vary the time of day when you check, between before the 3 meals, and at bedtime.  also check if you have symptoms of your blood sugar being too high or too low.  please keep a record of the readings and bring it to your next appointment here.  please call us sooner if your blood sugar goes below 70, or if you have a lot of readings over 200.

## 2012-12-19 ENCOUNTER — Ambulatory Visit: Payer: Medicare Other | Admitting: Cardiovascular Disease

## 2013-02-04 ENCOUNTER — Encounter: Payer: Self-pay | Admitting: Vascular Surgery

## 2013-02-05 ENCOUNTER — Encounter: Payer: Self-pay | Admitting: Vascular Surgery

## 2013-02-05 ENCOUNTER — Ambulatory Visit (INDEPENDENT_AMBULATORY_CARE_PROVIDER_SITE_OTHER): Payer: Medicare Other | Admitting: Vascular Surgery

## 2013-02-05 ENCOUNTER — Ambulatory Visit (INDEPENDENT_AMBULATORY_CARE_PROVIDER_SITE_OTHER)
Admission: RE | Admit: 2013-02-05 | Discharge: 2013-02-05 | Disposition: A | Discharge status: Home / Self Care | Payer: Medicare Other | Source: Ambulatory Visit | Attending: Vascular Surgery | Admitting: Vascular Surgery

## 2013-02-05 ENCOUNTER — Ambulatory Visit (HOSPITAL_COMMUNITY)
Admission: RE | Admit: 2013-02-05 | Discharge: 2013-02-05 | Disposition: A | Discharge status: Home / Self Care | Payer: Medicare Other | Source: Ambulatory Visit | Attending: Vascular Surgery | Admitting: Vascular Surgery

## 2013-02-05 VITALS — BP 147/84 | HR 65 | Resp 18 | Ht 74.0 in | Wt 155.0 lb

## 2013-02-05 DIAGNOSIS — I701 Atherosclerosis of renal artery: Secondary | ICD-10-CM

## 2013-02-05 DIAGNOSIS — I739 Peripheral vascular disease, unspecified: Secondary | ICD-10-CM

## 2013-02-05 DIAGNOSIS — Z48812 Encounter for surgical aftercare following surgery on the circulatory system: Secondary | ICD-10-CM

## 2013-02-05 NOTE — Progress Notes (Signed)
Subjective:     Patient ID: [name], male   DOB: [birth year], 79 y.o.   MRN: [id number]  HPI this 79 year old male returns for followup regarding his right renal stent and circulation to both lower extremities. He had a right renal stent placed in 2012 by Dr. Myra Gianotti for a severe right renal artery stenosis. He also had stenosis in the left renal artery and had a left renal mass which was eventually treated with cryotherapy. Patient has been stable. Lower extremities have been stable as well with no pain or numbness. He had an acute occlusion of the left limb of his aortobifemoral graft one year ago requiring thrombectomy with revision into the profundus femoris artery on the left. He has no specific complaints of his lower extremities at the present time. He did have severe edema recently which was treated with diuretics successfully.  Past Medical History  Diagnosis Date  . Hypertension   . Anemia   . PAD (peripheral artery disease) 10/10    Angiogram  Dr Myra Gianotti , severe stenosis left profunda after insertion of aorto-bifemoral  bypass graft-total occulsionof left SFA. Collateral flow through the profunda femoral artery.  Marland Kitchen CAD in native artery 2003    by cath   . Hypercholesteremia   . Thyroid disease   . Thyroiditis, unspecified   . Osteoarthritis of spine   . Olecranon bursitis   . 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 GERD (gastroesophageal reflux disease)   . Diabetes mellitus   . History of colonic polyps   . Iron deficiency anemia, unspecified   . Leg pain   . Numbness   . History of colonoscopy   . Chronic kidney disease   . History of blood transfusion   . Stroke     History  Substance Use Topics  . Smoking status: Current Every Day Smoker -- 0.75 packs/day for 60 years    Types: Cigarettes  . Smokeless tobacco: Never Used  . Alcohol Use: No    Family History  Problem Relation Age of Onset  . Kidney disease Sister   . Heart attack  Father   . Coronary artery disease Father   . Cancer Mother     Allergies  Allergen Reactions  . Clopidogrel Bisulfate Hives    Current outpatient prescriptions:atenolol (TENORMIN) 25 MG tablet, Take 1 tablet (25 mg total) by mouth every morning., Disp: 30 tablet, Rfl: 3;  atorvastatin (LIPITOR) 40 MG tablet, Take 1 tablet (40 mg total) by mouth daily., Disp: 90 tablet, Rfl: 3;  calcitRIOL (ROCALTROL) 0.5 MCG capsule, Take 0.5 mcg by mouth daily. , Disp: , Rfl:  dipyridamole-aspirin (AGGRENOX) 200-25 MG per 12 hr capsule, Take 1 capsule by mouth 2 (two) times daily., Disp: 180 capsule, Rfl: 3;  glucose blood (ONE TOUCH ULTRA TEST) test strip, 1 each by Other route 3 (three) times daily. And lancets 3/day 250.01, Disp: 270 each, Rfl: 12;  hydrochlorothiazide (HYDRODIURIL) 25 MG tablet, Take 25 mg by mouth every morning., Disp: , Rfl:  HYDROcodone-acetaminophen (NORCO) 10-325 MG per tablet, TAKE 1 TABLET BY MOUTH EVERY 4 HOURS AS NEEDED FOR PAIN, Disp: 100 tablet, Rfl: 1;  Insulin Lispro Prot & Lispro (HUMALOG MIX 75/25 KWIKPEN) (75-25) 100 UNIT/ML SUPN, , Disp: , Rfl: ;  Insulin Pen Needle 31G X 8 MM MISC, 1 Device by Does not apply route 2 (two) times daily., Disp: 180 each, Rfl: 3 sildenafil (VIAGRA) 100 MG tablet, Take 1 tablet (100 mg total) by mouth  daily as needed. For sexual activity, Disp: 5 tablet, Rfl: 11  BP 147/84  Pulse 65  Resp 18  Ht 6\' 2"  (1.88 m)  Wt 155 lb (70.308 kg)  BMI 19.89 kg/m2  SpO2 100%  Body mass index is 19.89 kg/(m^2).           Review of Systems complains of pain in legs with walking, swelling in lower extremities, weakness in legs. Other systems negative and complete review of systems    Objective:   Physical Exam BP 147/84  Pulse 65  Resp 18  Ht 6\' 2"  (1.88 m)  Wt 155 lb (70.308 kg)  BMI 19.89 kg/m2  SpO2 100%  Gen.-alert and oriented x3 in no apparent distress HEENT normal for age Lungs no rhonchi or wheezing Cardiovascular regular  rhythm no murmurs carotid pulses 3+ palpable no bruits audible Abdomen soft nontender no palpable masses Musculoskeletal free of  major deformities Skin clear -no rashes Neurologic normal Lower extremities 3+ femoral pulses palpable bilaterally. Absent popliteal and distal pulses bilaterally. No evidence of ischemia.  Today I ordered duplex scan of the renal arteries which was essentially unchanged from previous studies with no evidence of severely elevated velocities at the origin of the right renal artery where the stent is located. Kidney size remains stable at 9.5 cm on the right and 10.8 on the left. Lower extremity arterial ABIs were performed. The tibial vessels are noncompressible. There is good flow to both lower extremities with a widely patent aortobifemoral bypass graft        Assessment:     Stable right renal artery stent with previous cryotherapy left renal mass Stable lower extremity ABIs-asymptomatic    Plan:     Return in one year with repeat ABIs and duplex scan bilateral renal arteries and see nurse practitioner

## 2013-02-05 NOTE — Addendum Note (Signed)
Addended by: Sharee Pimple on: 02/05/2013 12:02 PM   Modules accepted: Orders

## 2013-02-12 ENCOUNTER — Other Ambulatory Visit: Payer: Self-pay

## 2013-02-28 ENCOUNTER — Other Ambulatory Visit: Payer: Self-pay

## 2013-03-25 ENCOUNTER — Telehealth: Payer: Self-pay

## 2013-03-25 MED ORDER — HYDROCODONE-ACETAMINOPHEN 10-325 MG PO TABS: 1.0000 | ORAL_TABLET | ORAL | Status: DC | PRN

## 2013-03-25 NOTE — Telephone Encounter (Signed)
Patient called requesting a refill for Hydrocodone. Patient was last seen on 8/21/214 and medication was last filled on 12/10/2012.   Thanks!

## 2013-03-25 NOTE — Addendum Note (Signed)
Addended by: Romero Belling on: 03/25/2013 02:42 PM   Modules accepted: Orders

## 2013-03-25 NOTE — Telephone Encounter (Signed)
i printed 

## 2013-03-28 ENCOUNTER — Other Ambulatory Visit: Payer: Self-pay

## 2013-03-28 MED ORDER — INSULIN LISPRO PROT & LISPRO (75-25 MIX) 100 UNIT/ML KWIKPEN: PEN_INJECTOR | SUBCUTANEOUS | Status: DC

## 2013-04-10 ENCOUNTER — Ambulatory Visit (INDEPENDENT_AMBULATORY_CARE_PROVIDER_SITE_OTHER): Payer: Medicare Other | Admitting: Endocrinology

## 2013-04-10 ENCOUNTER — Ambulatory Visit
Admission: RE | Admit: 2013-04-10 | Discharge: 2013-04-10 | Disposition: A | Discharge status: Home / Self Care | Payer: Medicare Other | Source: Ambulatory Visit | Attending: Endocrinology | Admitting: Endocrinology

## 2013-04-10 ENCOUNTER — Encounter: Payer: Self-pay | Admitting: Endocrinology

## 2013-04-10 VITALS — BP 124/78 | HR 65 | Temp 97.5°F | Ht 74.0 in | Wt 121.0 lb

## 2013-04-10 DIAGNOSIS — R918 Other nonspecific abnormal finding of lung field: Secondary | ICD-10-CM | POA: Insufficient documentation

## 2013-04-10 DIAGNOSIS — R634 Abnormal weight loss: Secondary | ICD-10-CM

## 2013-04-10 DIAGNOSIS — E109 Type 1 diabetes mellitus without complications: Secondary | ICD-10-CM

## 2013-04-10 DIAGNOSIS — N289 Disorder of kidney and ureter, unspecified: Secondary | ICD-10-CM

## 2013-04-10 LAB — BASIC METABOLIC PANEL
BUN: 47 mg/dL — ABNORMAL HIGH (ref 6–23)
CO2: 18 mEq/L — ABNORMAL LOW (ref 19–32)
Calcium: 8.7 mg/dL (ref 8.4–10.5)
Chloride: 111 mEq/L (ref 96–112)
Creatinine, Ser: 2.6 mg/dL — ABNORMAL HIGH (ref 0.4–1.5)
Potassium: 4.2 mEq/L (ref 3.5–5.1)

## 2013-04-10 LAB — CBC WITH DIFFERENTIAL/PLATELET
Basophils Absolute: 0 10*3/uL (ref 0.0–0.1)
Basophils Relative: 0.7 % (ref 0.0–3.0)
Eosinophils Absolute: 0.1 10*3/uL (ref 0.0–0.7)
HCT: 36 % — ABNORMAL LOW (ref 39.0–52.0)
Hemoglobin: 11.6 g/dL — ABNORMAL LOW (ref 13.0–17.0)
Lymphocytes Relative: 15 % (ref 12.0–46.0)
Lymphs Abs: 0.8 10*3/uL (ref 0.7–4.0)
MCHC: 32.2 g/dL (ref 30.0–36.0)
MCV: 86.3 fl (ref 78.0–100.0)
Neutro Abs: 4.1 10*3/uL (ref 1.4–7.7)
Platelets: 119 10*3/uL — ABNORMAL LOW (ref 150.0–400.0)
RBC: 4.17 Mil/uL — ABNORMAL LOW (ref 4.22–5.81)
RDW: 18.8 % — ABNORMAL HIGH (ref 11.5–14.6)

## 2013-04-10 LAB — HEPATIC FUNCTION PANEL
ALT: 21 U/L (ref 0–53)
AST: 35 U/L (ref 0–37)
Alkaline Phosphatase: 109 U/L (ref 39–117)
Bilirubin, Direct: 0.2 mg/dL (ref 0.0–0.3)
Total Bilirubin: 0.9 mg/dL (ref 0.3–1.2)
Total Protein: 7.3 g/dL (ref 6.0–8.3)

## 2013-04-10 LAB — TSH: TSH: 1.64 u[IU]/mL (ref 0.35–5.50)

## 2013-04-10 NOTE — Patient Instructions (Signed)
blood and urine tests are being requested for you today.  We'll contact you with results. Let's also check a ct.  you will receive a phone call, about a day and time for an appointment. here are some tests for blood in the bowels.  please follow the instructions, and return to the lab at the old office ([address] Please come back for a follow-up appointment in 1 month.

## 2013-04-10 NOTE — Progress Notes (Signed)
Subjective:    Patient ID: [name], male    DOB: 12/26/1930, 79 y.o.   MRN: [id number]  HPI Pt states few mos of slight pain at both anterior thighs, and assoc weight loss. no cbg record, but states cbg's are well-controlled.  He denies hypoglycemia.   Past Medical History  Diagnosis Date  . Hypertension   . Anemia   . PAD (peripheral artery disease) 10/10    Angiogram  Dr [name] , severe stenosis left profunda after insertion of aorto-bifemoral  bypass graft-total occulsionof left SFA. Collateral flow through the profunda femoral artery.  Marland Kitchen CAD in native artery 2003    by cath   . Hypercholesteremia   . Thyroid disease   . Thyroiditis, unspecified   . Osteoarthritis of spine   . Olecranon bursitis   . 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 GERD (gastroesophageal reflux disease)   . Diabetes mellitus   . History of colonic polyps   . Iron deficiency anemia, unspecified   . Leg pain   . Numbness   . History of colonoscopy   . Chronic kidney disease   . History of blood transfusion   . Stroke     Past Surgical History  Procedure Laterality Date  . Appendectomy    . Aortobifemoral bypass  2002  . Electrocardiogram  01/12/2006  . Stress cardiolite  04/14/2003  . Pr vein bypass graft,aorto-fem-pop  2002  . Renal artery stent  11/16/2010    Right renal artery  . Eye surgery      Cataract  . Embolectomy  03/08/2012    Procedure: EMBOLECTOMY;  Surgeon: Steve Ochoa, MD;  Location: [facility] OR;  Service: Vascular;  Laterality: Left;  THROMBECTOMY OF LEFT LIMB OF AORTO-FEMORAL BYPASS GRAFT  . Patch angioplasty  03/08/2012    Procedure: PATCH ANGIOPLASTY;  Surgeon: Steve Ochoa, MD;  Location: [facility] OR;  Service: Vascular;  Laterality: Left;  Left fropunda endarterectomy with patch angioplasty    History   Social History  . Marital Status: Married    Spouse Name: N/A    Number of Children: 2  . Years of Education: N/A   Occupational History    . Hydrologist     Retired   Social History Main Topics  . Smoking status: Current Every Day Smoker -- 0.75 packs/day for 60 years    Types: Cigarettes  . Smokeless tobacco: Never Used  . Alcohol Use: No  . Drug Use: No  . Sexual Activity: No   Other Topics Concern  . Not on file   Social History Narrative   Occupation: Retired Scientist, water quality   2 children, 4 grandchildren   Works in yard frequently    Current Outpatient Prescriptions on File Prior to Visit  Medication Sig Dispense Refill  . atenolol (TENORMIN) 25 MG tablet Take 1 tablet (25 mg total) by mouth every morning.  30 tablet  3  . atorvastatin (LIPITOR) 40 MG tablet Take 1 tablet (40 mg total) by mouth daily.  90 tablet  3  . calcitRIOL (ROCALTROL) 0.5 MCG capsule Take 0.5 mcg by mouth daily.       Marland Kitchen dipyridamole-aspirin (AGGRENOX) 200-25 MG per 12 hr capsule Take 1 capsule by mouth 2 (two) times daily.  180 capsule  3  . glucose blood (ONE TOUCH ULTRA TEST) test strip 1 each by Other route 3 (three) times daily. And lancets 3/day 250.01  270 each  12  . hydrochlorothiazide (  HYDRODIURIL) 25 MG tablet Take 25 mg by mouth every morning.      Marland Kitchen HYDROcodone-acetaminophen (NORCO) 10-325 MG per tablet Take 1 tablet by mouth every 4 (four) hours as needed.  100 tablet  0  . Insulin Lispro Prot & Lispro (HUMALOG MIX 75/25 KWIKPEN) (75-25) 100 UNIT/ML SUPN Inject under the skin twice daily 13 units with breakfast and 9 units with evening meal  40 mL  1  . Insulin Pen Needle 31G X 8 MM MISC 1 Device by Does not apply route 2 (two) times daily.  180 each  3  . sildenafil (VIAGRA) 100 MG tablet Take 1 tablet (100 mg total) by mouth daily as needed. For sexual activity  5 tablet  11   No current facility-administered medications on file prior to visit.    Allergies  Allergen Reactions  . Clopidogrel Bisulfate Hives    Family History  Problem Relation Age of Onset  . Kidney disease Sister   . Heart attack Father    . Coronary artery disease Father   . Cancer Mother    BP 124/78  Pulse 65  Temp(Src) 97.5 F (36.4 C) (Oral)  Ht 6\' 2"  (1.88 m)  Wt 121 lb (54.885 kg)  BMI 15.53 kg/m2  Review of Systems He says appetite is poor.  Denies brbpr, cough, and hematuria.    Objective:   Physical Exam VS: see vs page GEN: no distress HEAD: head: no deformity eyes: no periorbital swelling, no proptosis external nose and ears are normal mouth: no lesion seen NECK: supple, thyroid is not enlarged CHEST WALL: no deformity LUNGS: clear to auscultation BREASTS:  No gynecomastia CV: reg rate and rhythm, no murmur ABD: abdomen is soft, nontender.  no hepatosplenomegaly.  not distended.  no hernia.   MUSCULOSKELETAL: muscle bulk and strength are grossly decreased from normal.  no obvious joint swelling.   EXTEMITIES: no deformity.   no edema PULSES: dorsalis pedis intact bilat.   NEURO:  cn 2-12 grossly intact.   readily moves all 4's.  sensation is intact to touch on the feet.   SKIN:  Normal texture and temperature.  No rash or suspicious lesion is visible.   NODES:  None palpable at the neck PSYCH: alert, well-oriented.  Does not appear anxious nor depressed.   CXR: ? Of mets    Assessment & Plan:  Weight loss: uncertain etiology Renal failure: in this context, we can't do contrast CT Abnormal CXR: ? Of mets

## 2013-04-12 ENCOUNTER — Ambulatory Visit
Admission: RE | Admit: 2013-04-12 | Discharge: 2013-04-12 | Disposition: A | Discharge status: Home / Self Care | Payer: Medicare Other | Source: Ambulatory Visit | Attending: Endocrinology | Admitting: Endocrinology

## 2013-04-12 ENCOUNTER — Telehealth: Payer: Self-pay

## 2013-04-12 ENCOUNTER — Other Ambulatory Visit (INDEPENDENT_AMBULATORY_CARE_PROVIDER_SITE_OTHER): Payer: Medicare Other

## 2013-04-12 ENCOUNTER — Other Ambulatory Visit: Payer: Self-pay | Admitting: Endocrinology

## 2013-04-12 ENCOUNTER — Other Ambulatory Visit: Payer: Self-pay

## 2013-04-12 DIAGNOSIS — R918 Other nonspecific abnormal finding of lung field: Secondary | ICD-10-CM

## 2013-04-12 DIAGNOSIS — R634 Abnormal weight loss: Secondary | ICD-10-CM

## 2013-04-12 DIAGNOSIS — N289 Disorder of kidney and ureter, unspecified: Secondary | ICD-10-CM

## 2013-04-12 DIAGNOSIS — Z79899 Other long term (current) drug therapy: Secondary | ICD-10-CM | POA: Insufficient documentation

## 2013-04-12 LAB — URINALYSIS, ROUTINE W REFLEX MICROSCOPIC
Bilirubin Urine: NEGATIVE
Hgb urine dipstick: NEGATIVE
Leukocytes, UA: NEGATIVE
Specific Gravity, Urine: 1.025 (ref 1.000–1.030)
Urine Glucose: NEGATIVE
Urobilinogen, UA: 0.2 (ref 0.0–1.0)

## 2013-04-12 NOTE — Telephone Encounter (Signed)
Erskine Squibb from [facility] called results from test. CT Chest abnormal bilateral pulmonary nodule. Left upper lobe pulmonary mass consistent with pulmonary metastasis. Soft tissue density 4cm. Upper pole left kidney. Recommends revaluation with MRI with and without contrast if renal function allows.

## 2013-04-15 ENCOUNTER — Other Ambulatory Visit: Payer: Medicare Other

## 2013-04-15 DIAGNOSIS — Z79899 Other long term (current) drug therapy: Secondary | ICD-10-CM

## 2013-04-15 LAB — PROTIME-INR: Prothrombin Time: 14 s — ABNORMAL HIGH (ref 10.2–12.4)

## 2013-04-16 ENCOUNTER — Ambulatory Visit (INDEPENDENT_AMBULATORY_CARE_PROVIDER_SITE_OTHER): Payer: Medicare Other | Admitting: Pulmonary Disease

## 2013-04-16 ENCOUNTER — Encounter: Payer: Self-pay | Admitting: Pulmonary Disease

## 2013-04-16 VITALS — BP 110/62 | HR 66 | Temp 97.5°F | Ht 74.0 in | Wt 152.0 lb

## 2013-04-16 DIAGNOSIS — R918 Other nonspecific abnormal finding of lung field: Secondary | ICD-10-CM | POA: Insufficient documentation

## 2013-04-16 NOTE — Assessment & Plan Note (Signed)
The patient has a history of renal cell carcinoma that was treated with cryoablation. 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 On top of this, he has an extensive smoking history, and is at high risk for bronchogenic cancer. His current CT chest shows numerous bilateral pulmonary nodules, and a 3.5 cm left upper lobe mass. I suspect this is all related to renal cell cancer, but I cannot exclude a concomitant bronchogenic cancer in the left upper lobe given its disproportionate size. I do not think this has anything to do with his prior history of amyloid. He will probably need a biopsy of his left upper lobe mass, but I am concerned about the potential bleeding risk. He is currently on Aggrenox, and renal cell cancer is one of those with known increased bleeding risk with biopsy. I think it would be prudent to do a PET scan to see if there is an easier site to biopsy for possible metastatic disease. I will also send a note to his cardiologist to see if that is okay to discontinue his Aggrenox for biopsy. Finally, I think he needs to be referred to oncology for further evaluation.

## 2013-04-16 NOTE — Patient Instructions (Addendum)
Will refer you to cancer doctor for evaluation Will schedule for PET scan Will send a note to your cardiologist to see if it is safe for you to come off your aggrenox for possible biopsy Will call you with results after pet scan.

## 2013-04-16 NOTE — Progress Notes (Signed)
   Subjective:    Patient ID: [name], male    DOB: 1930/07/07, 79 y.o.   MRN: [id number]  HPI The patient is an 79 year old male who I've been asked to see for pulmonary nodules. The patient has a history of renal cell carcinoma which was treated with cryoablation the end of last year. He also had a biopsy of a fat pad which showed amyloid. He recently was found to have nodules on a chest x-ray, and a followup CT verified the results. He was found to have a 3.5 cm mass in the left upper lobe, as well as 12 nodules bilaterally. He also had bilateral pleural effusions consistent with heart failure, and a 4 cm mass of his left kidney. The patient denies any cough or hemoptysis, but has had a poor appetite with weight loss. He has not had any chest pain, but has had some shortness of breath over his usual baseline.  Complicating all of this, the patient has a greater than 50 year history of smoking, and continues to do so. He is also on Aggrenox for vascular disease.   Review of Systems  Constitutional: Positive for appetite change and fatigue. Negative for fever and unexpected weight change.  HENT: Negative for congestion, dental problem, ear pain, nosebleeds, postnasal drip, rhinorrhea, sinus pressure, sneezing, sore throat and trouble swallowing.   Eyes: Negative for redness and itching.  Respiratory: Positive for shortness of breath ( with exertion and lying down at night). Negative for cough, chest tightness and wheezing.   Cardiovascular: Negative for palpitations and leg swelling.  Gastrointestinal: Negative for nausea and vomiting.  Genitourinary: Negative for dysuria.  Musculoskeletal: Negative for joint swelling.  Skin: Negative for rash.  Neurological: Negative for headaches.  Hematological: Does not bruise/bleed easily.  Psychiatric/Behavioral: Negative for dysphoric mood. The patient is not nervous/anxious.        Objective:   Physical Exam Constitutional:  Thin male,  no  acute distress  HENT:  Nares patent without discharge  Oropharynx without exudate, palate and uvula are normal  Eyes:  Perrla, eomi, no scleral icterus  Neck:  No JVD, no TMG  Cardiovascular:  Normal rate, regular rhythm, no rubs or gallops.  2/6 sem        Intact distal pulses but decreased  Pulmonary :  Normal breath sounds, no stridor or respiratory distress   No rhonchi, or wheezing.  +bilat crackles in bases.   Abdominal:  Soft, nondistended, bowel sounds present.  No tenderness noted.   Musculoskeletal:  + lower extremity edema noted.  Lymph Nodes:  No cervical lymphadenopathy noted  Skin:  No cyanosis noted  Neurologic:  Alert, appropriate, moves all 4 extremities without obvious deficit.         Assessment & Plan:

## 2013-04-17 ENCOUNTER — Telehealth: Payer: Self-pay | Admitting: Internal Medicine

## 2013-04-17 ENCOUNTER — Telehealth: Payer: Self-pay | Admitting: Endocrinology

## 2013-04-17 NOTE — Telephone Encounter (Signed)
C/D 04/17/13 for appt. 04/22/13 °

## 2013-04-17 NOTE — Telephone Encounter (Signed)
Dr Everardo All please advise if ct biopsy need to be done. See Dr Shelle Iron office note on 04/16/13. Please advise.  Thanks

## 2013-04-17 NOTE — Telephone Encounter (Signed)
Pt informed. Pt is going for bx next Monday.

## 2013-04-17 NOTE — Telephone Encounter (Signed)
Yes, it needs to be done.  please call patient: d/c aggrenox.  Resume 2 days after bx.

## 2013-04-17 NOTE — Telephone Encounter (Signed)
S/W PT AND GVE NP APPT 12/29 @ 1:30 W/DR. MOHAMED REFERRING DR. Marcelyn Bruins DX- PULMONARY NODULES WELCOME PACKET MAILED.

## 2013-04-19 ENCOUNTER — Other Ambulatory Visit: Payer: Self-pay | Admitting: *Deleted

## 2013-04-19 DIAGNOSIS — R918 Other nonspecific abnormal finding of lung field: Secondary | ICD-10-CM

## 2013-04-22 ENCOUNTER — Ambulatory Visit: Payer: Medicare Other

## 2013-04-22 ENCOUNTER — Ambulatory Visit (HOSPITAL_BASED_OUTPATIENT_CLINIC_OR_DEPARTMENT_OTHER): Payer: Medicare Other | Admitting: Internal Medicine

## 2013-04-22 ENCOUNTER — Encounter: Payer: Self-pay | Admitting: Internal Medicine

## 2013-04-22 ENCOUNTER — Other Ambulatory Visit (HOSPITAL_BASED_OUTPATIENT_CLINIC_OR_DEPARTMENT_OTHER): Payer: Medicare Other

## 2013-04-22 VITALS — BP 137/69 | HR 69 | Temp 97.5°F | Resp 18 | Ht 74.0 in | Wt 155.0 lb

## 2013-04-22 DIAGNOSIS — E079 Disorder of thyroid, unspecified: Secondary | ICD-10-CM

## 2013-04-22 DIAGNOSIS — Z87891 Personal history of nicotine dependence: Secondary | ICD-10-CM

## 2013-04-22 DIAGNOSIS — R918 Other nonspecific abnormal finding of lung field: Secondary | ICD-10-CM

## 2013-04-22 DIAGNOSIS — N289 Disorder of kidney and ureter, unspecified: Secondary | ICD-10-CM

## 2013-04-22 DIAGNOSIS — E119 Type 2 diabetes mellitus without complications: Secondary | ICD-10-CM

## 2013-04-22 DIAGNOSIS — N189 Chronic kidney disease, unspecified: Secondary | ICD-10-CM

## 2013-04-22 DIAGNOSIS — I2510 Atherosclerotic heart disease of native coronary artery without angina pectoris: Secondary | ICD-10-CM

## 2013-04-22 DIAGNOSIS — R599 Enlarged lymph nodes, unspecified: Secondary | ICD-10-CM

## 2013-04-22 LAB — COMPREHENSIVE METABOLIC PANEL (CC13)
ALT: 18 U/L (ref 0–55)
AST: 25 U/L (ref 5–34)
Calcium: 8.4 mg/dL (ref 8.4–10.4)
Chloride: 115 mEq/L — ABNORMAL HIGH (ref 98–109)
Creatinine: 2.7 mg/dL — ABNORMAL HIGH (ref 0.7–1.3)
Sodium: 143 mEq/L (ref 136–145)
Total Bilirubin: 0.81 mg/dL (ref 0.20–1.20)
Total Protein: 7.2 g/dL (ref 6.4–8.3)

## 2013-04-22 LAB — CBC WITH DIFFERENTIAL/PLATELET
BASO%: 1 % (ref 0.0–2.0)
Basophils Absolute: 0.1 10*3/uL (ref 0.0–0.1)
Eosinophils Absolute: 0.1 10*3/uL (ref 0.0–0.5)
HGB: 11.8 g/dL — ABNORMAL LOW (ref 13.0–17.1)
LYMPH%: 16.1 % (ref 14.0–49.0)
MCV: 89.9 fL (ref 79.3–98.0)
MONO#: 0.5 10*3/uL (ref 0.1–0.9)
MONO%: 9.8 % (ref 0.0–14.0)
NEUT#: 3.6 10*3/uL (ref 1.5–6.5)
RDW: 18.9 % — ABNORMAL HIGH (ref 11.0–14.6)
WBC: 5.1 10*3/uL (ref 4.0–10.3)
lymph#: 0.8 10*3/uL — ABNORMAL LOW (ref 0.9–3.3)

## 2013-04-22 NOTE — Progress Notes (Signed)
[facility] [phone]   [phone]  CONSULT NOTE  REFERRING PHYSICIAN: Dr. Marcelyn Bruins  REASON FOR CONSULTATION:  79 years old African American male with questionable metastatic carcinoma  HPI [name] is a 79 y.o. male was past medical history significant for multiple medical problems including diabetes mellitus, left renal mass, adrenal insufficiency, peripheral vascular disease, dyslipidemia, history of coronary artery disease, thyroiditis and long history of smoking. During routine evaluation at his primary care physician's office, there was a concern about significant weight loss which initiated further evaluation including chest x-ray which was performed on 04/10/2013 and it showed COPD with cardiac enlargement with bilateral small pleural effusions and bilateral pulmonary nodules suspicious for metastatic disease. This was followed by a CT scan of the chest without contrast on 04/12/2013 and it showed bilateral pulmonary nodules of varying size. There was also a mass in the left upper lobe measured 3.5 CM in the medial left upper lobe. There was also an example nodule in the right lower lobe measuring 1.6 CM and index nodule in the left lung measuring 1.0 CM. There were approximately 12 nodules combined in the left and right lungs. There is no axillary or supraclavicular lymphadenopathy evident on the noncontrast exam and no clear mediastinal adenopathy. CT of the abdomen on the same day showed a mass in the upper pole of the left kidney measuring 4.0 CM and pelvis to the 4.0 CM viable ablation sites seen on comparison MRI from 10/09/2012 and this has soft tissue attenuation and central calcifications.  Of note, the patient had ultrasound guided core biopsy of the abdominal fat pad as well as CT guided core biopsy of the left adrenal mass and CT guided percutaneous cryoablation of the left renal mass under the care of Dr. Fredia Sorrow on 03/02/2012. The  final pathology (Accession #: [id number]) showed that the soft tissue needle core biopsy of the abdominal fat pad was consistent with Amyloidosis and the biopsy of the left renal mass was consistent with clear cell renal cell carcinoma with associated stromal fibrosis, Furhman nuclear grade II. The core biopsies show neoplastic cells with clear cytoplasm associated with dense eosinophilic stroma. Immunohistochemical stains were performed and the neoplastic cells are strongly positive for CD10, vimentin and negative for cytokeratin 7, CD68 and Factor XIIIa. Congo red and crystal violet stains show no evidence of amyloid deposition with appropriate control. The patient was seen by Dr. Shelle Iron and he was concerned about proceeding with biopsy of one of the pulmonary nodules because of the high-risk of pulmonary hemorrhage if this is consistent with renal cell carcinoma. The patient is scheduled for a PET scan on 04/26/2013. The patient is also followed by Dr. Eliott Nine for his renal insufficiency and Dr. Mena Goes for his renal mass. When seen today he was accompanied by his wife. The patient continues to have pain in the legs especially after having thrombectomy of the left limb of the aortobifem.BPG ON. He denied having any significant chest pain but continues to have shortness breath with exertion and mild cough with no hemoptysis. The patient denied having any current weight loss or night sweats. He has no visual changes or headache.  HPI  Past Medical History  Diagnosis Date  . Hypertension   . Anemia   . PAD (peripheral artery disease) 10/10    Angiogram  Dr Myra Gianotti , severe stenosis left profunda after insertion of aorto-bifemoral  bypass graft-total occulsionof left SFA. Collateral flow through the profunda femoral artery.  Marland Kitchen  CAD in native artery 2003    by cath   . Hypercholesteremia   . Thyroid disease   . Thyroiditis, unspecified   . Osteoarthritis of spine   . Olecranon bursitis   . 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 GERD (gastroesophageal reflux disease)   . Diabetes mellitus   . History of colonic polyps   . Iron deficiency anemia, unspecified   . Leg pain   . Numbness   . History of colonoscopy   . Chronic kidney disease   . History of blood transfusion   . Stroke     Past Surgical History  Procedure Laterality Date  . Appendectomy    . Aortobifemoral bypass  2002  . Electrocardiogram  01/12/2006  . Stress cardiolite  04/14/2003  . Pr vein bypass graft,aorto-fem-pop  2002  . Renal artery stent  11/16/2010    Right renal artery  . Eye surgery      Cataract  . Embolectomy  03/08/2012    Procedure: EMBOLECTOMY;  Surgeon: Pryor Ochoa, MD;  Location: [facility] OR;  Service: Vascular;  Laterality: Left;  THROMBECTOMY OF LEFT LIMB OF AORTO-FEMORAL BYPASS GRAFT  . Patch angioplasty  03/08/2012    Procedure: PATCH ANGIOPLASTY;  Surgeon: Pryor Ochoa, MD;  Location: [facility] OR;  Service: Vascular;  Laterality: Left;  Left fropunda endarterectomy with patch angioplasty    Family History  Problem Relation Age of Onset  . Kidney disease Sister   . Heart attack Father   . Coronary artery disease Father   . Cancer Mother     Social History History  Substance Use Topics  . Smoking status: Current Every Day Smoker -- 0.75 packs/day for 60 years    Types: Cigarettes  . Smokeless tobacco: Never Used  . Alcohol Use: No    Allergies  Allergen Reactions  . Clopidogrel Bisulfate Hives    Current Outpatient Prescriptions  Medication Sig Dispense Refill  . atenolol (TENORMIN) 25 MG tablet Take 1 tablet (25 mg total) by mouth every morning.  30 tablet  3  . atorvastatin (LIPITOR) 40 MG tablet Take 1 tablet (40 mg total) by mouth daily.  90 tablet  3  . calcitRIOL (ROCALTROL) 0.5 MCG capsule Take 0.5 mcg by mouth daily.       Marland Kitchen dipyridamole-aspirin (AGGRENOX) 200-25 MG per 12 hr capsule Take 1 capsule by mouth 2 (two) times daily.  180 capsule  3  .  furosemide (LASIX) 80 MG tablet       . glucose blood (ONE TOUCH ULTRA TEST) test strip 1 each by Other route 3 (three) times daily. And lancets 3/day 250.01  270 each  12  . hydrochlorothiazide (HYDRODIURIL) 25 MG tablet Take 25 mg by mouth every morning.      Marland Kitchen HYDROcodone-acetaminophen (NORCO) 10-325 MG per tablet Take 1 tablet by mouth every 4 (four) hours as needed.  100 tablet  0  . Insulin Lispro Prot & Lispro (HUMALOG MIX 75/25 KWIKPEN) (75-25) 100 UNIT/ML SUPN Inject under the skin twice daily 13 units with breakfast and 9 units with evening meal  40 mL  1  . Insulin Pen Needle 31G X 8 MM MISC 1 Device by Does not apply route 2 (two) times daily.  180 each  3  . sildenafil (VIAGRA) 100 MG tablet Take 1 tablet (100 mg total) by mouth daily as needed. For sexual activity  5 tablet  11   No current facility-administered medications for this  visit.    Review of Systems  Constitutional: positive for fatigue Eyes: negative Ears, nose, mouth, throat, and face: negative Respiratory: positive for cough and dyspnea on exertion Cardiovascular: negative Gastrointestinal: negative Genitourinary:negative Integument/breast: negative Hematologic/lymphatic: negative Musculoskeletal:positive for muscle weakness Neurological: negative Behavioral/Psych: negative Endocrine: negative Allergic/Immunologic: negative  Physical Exam  ZOX:WRUEA, healthy, no distress, well nourished and well developed SKIN: skin color, texture, turgor are normal, no rashes or significant lesions HEAD: Normocephalic, No masses, lesions, tenderness or abnormalities EYES: normal, PERRLA EARS: External ears normal, Canals clear OROPHARYNX:no exudate, no erythema and lips, buccal mucosa, and tongue normal  NECK: supple, no adenopathy, no JVD LYMPH:  no palpable lymphadenopathy, no hepatosplenomegaly LUNGS: clear to auscultation , and palpation HEART: regular rate & rhythm, no murmurs and no gallops ABDOMEN:abdomen  soft, non-tender, normal bowel sounds and no masses or organomegaly BACK: Back symmetric, no curvature., No CVA tenderness EXTREMITIES:no joint deformities, effusion, or inflammation, 1+ edema bilaterally.  NEURO: alert & oriented x 3 with fluent speech, no focal motor/sensory deficits  PERFORMANCE STATUS: ECOG 2-3  LABORATORY DATA: Lab Results  Component Value Date   WBC 5.1 04/22/2013   HGB 11.8* 04/22/2013   HCT 37.9* 04/22/2013   MCV 89.9 04/22/2013   PLT 92* 04/22/2013      Chemistry      Component Value Date/Time   NA 143 04/22/2013 1342   NA 139 04/10/2013 1049   K 3.9 04/22/2013 1342   K 4.2 04/10/2013 1049   CL 111 04/10/2013 1049   CO2 16* 04/22/2013 1342   CO2 18* 04/10/2013 1049   BUN 44.7* 04/22/2013 1342   BUN 47* 04/10/2013 1049   CREATININE 2.7* 04/22/2013 1342   CREATININE 2.6* 04/10/2013 1049   CREATININE 1.84* 09/10/2012 0948      Component Value Date/Time   CALCIUM 8.4 04/22/2013 1342   CALCIUM 8.7 04/10/2013 1049   CALCIUM 8.9 08/23/2012 0447   ALKPHOS 115 04/22/2013 1342   ALKPHOS 109 04/10/2013 1049   AST 25 04/22/2013 1342   AST 35 04/10/2013 1049   ALT 18 04/22/2013 1342   ALT 21 04/10/2013 1049   BILITOT 0.81 04/22/2013 1342   BILITOT 0.9 04/10/2013 1049       RADIOGRAPHIC STUDIES: Ct Abdomen Pelvis Wo Contrast  04/12/2013   CLINICAL DATA:  Lung nodules on chest radiograph. History of smoking and COPD as well as renal failure  EXAM: CT CHEST, ABDOMEN AND PELVIS WITHOUT CONTRAST  TECHNIQUE: Multidetector CT imaging of the chest, abdomen and pelvis was performed following the standard protocol without IV contrast.  COMPARISON:  CT 06/02/2011, MRI 04/05/2012, 10/09/2012 is  FINDINGS: CT CHEST FINDINGS  There are bilateral round pulmonary nodules of varying. There is a left upper lobe mass. The mass measures 3.5 cm in the medial left upper lobe (image 17). Example nodule in the right lower lobe measures 16 mm (image 46, series 3). Index nodule in  the left lung measures 10 mm (image 21). There approximately 12 nodules combined in the left and right lungs.  No axillary or supraclavicular lymphadenopathy evident on this noncontrast exam. No clear mediastinal adenopathy. No clear hilar adenopathy. No pericardial fluid. Esophagus is grossly normal.  There bilateral small pleural effusions greater on the left.  CT ABDOMEN AND PELVIS FINDINGS  No focal hepatic lesion on this noncontrast exam. The gallbladder is distended. There is extensive calcification of the pancreas with duct dilatation suggesting chronic pancreatitis. Spleen is small. Adrenal glands are grossly normal with difficult a evaluate.  There is a mass in the upper pole of the left kidney measuring 4 cm (image 19, series 2). This compares to the 4 cm cryoablation site seen on comparison MRI 10/09/2012. This has soft tissue attenuation and central calcifications.  This stomach were, small bowel a and colon are unremarkable. Abdominal aorta is heavily calcified. There is a repair of abdominal aorta noted.  Bladder and prostate gland appear normal. No pelvic lymphadenopathy. There is anasarca the soft tissues of the thorax and abdomen. View of the bone windows demonstrates no aggressive osseous lesion.  IMPRESSION: 1. Bilateral pulmonary nodule and left upper lobe pulmonary mass consistent with pulmonary metastasis. 2. Soft tissue density measuring 4 cm upper pole of the left kidney corresponds to the cryoablation site seen on comparison exams. Recommend re-evaluation with MRI with and without contrast if renal function allows. 3. Bilateral pleural effusions and anasarca of the soft tissues. 4. Chronic pancreatitis. These results will be called to the ordering clinician or representative by the Radiologist Assistant, and communication documented in the PACS Dashboard.   Electronically Signed   By: Genevive Bi M.D.   On: 04/12/2013 10:27   Dg Chest 2 View  04/10/2013   CLINICAL DATA:  Weight loss.   Smoker.  Renal cell carcinoma  EXAM: CHEST  2 VIEW  COMPARISON:  02/28/2012  FINDINGS: COPD with pulmonary hyperinflation. Cardiac enlargement. Negative for edema. Small pleural effusions are present. Mild bibasilar airspace disease.  2 cm left upper lobe mass lesion. Question additional multiple pulmonary nodules.  IMPRESSION: COPD.  Cardiac enlargement with bilateral small pleural effusions. Negative for edema.  Bilateral pulmonary nodules are present. This pattern is suspicious for metastatic disease. CT chest with contrast suggested for further evaluation.   Electronically Signed   By: Marlan Palau M.D.   On: 04/10/2013 13:09   Ct Chest Wo Contrast  04/12/2013   CLINICAL DATA:  Lung nodules on chest radiograph. History of smoking and COPD as well as renal failure  EXAM: CT CHEST, ABDOMEN AND PELVIS WITHOUT CONTRAST  TECHNIQUE: Multidetector CT imaging of the chest, abdomen and pelvis was performed following the standard protocol without IV contrast.  COMPARISON:  CT 06/02/2011, MRI 04/05/2012, 10/09/2012 is  FINDINGS: CT CHEST FINDINGS  There are bilateral round pulmonary nodules of varying. There is a left upper lobe mass. The mass measures 3.5 cm in the medial left upper lobe (image 17). Example nodule in the right lower lobe measures 16 mm (image 46, series 3). Index nodule in the left lung measures 10 mm (image 21). There approximately 12 nodules combined in the left and right lungs.  No axillary or supraclavicular lymphadenopathy evident on this noncontrast exam. No clear mediastinal adenopathy. No clear hilar adenopathy. No pericardial fluid. Esophagus is grossly normal.  There bilateral small pleural effusions greater on the left.  CT ABDOMEN AND PELVIS FINDINGS  No focal hepatic lesion on this noncontrast exam. The gallbladder is distended. There is extensive calcification of the pancreas with duct dilatation suggesting chronic pancreatitis. Spleen is small. Adrenal glands are grossly normal with  difficult a evaluate. There is a mass in the upper pole of the left kidney measuring 4 cm (image 19, series 2). This compares to the 4 cm cryoablation site seen on comparison MRI 10/09/2012. This has soft tissue attenuation and central calcifications.  This stomach were, small bowel a and colon are unremarkable. Abdominal aorta is heavily calcified. There is a repair of abdominal aorta noted.  Bladder and prostate gland appear normal.  No pelvic lymphadenopathy. There is anasarca the soft tissues of the thorax and abdomen. View of the bone windows demonstrates no aggressive osseous lesion.  IMPRESSION: 1. Bilateral pulmonary nodule and left upper lobe pulmonary mass consistent with pulmonary metastasis. 2. Soft tissue density measuring 4 cm upper pole of the left kidney corresponds to the cryoablation site seen on comparison exams. Recommend re-evaluation with MRI with and without contrast if renal function allows. 3. Bilateral pleural effusions and anasarca of the soft tissues. 4. Chronic pancreatitis. These results will be called to the ordering clinician or representative by the Radiologist Assistant, and communication documented in the PACS Dashboard.   Electronically Signed   By: Genevive Bi M.D.   On: 04/12/2013 10:27    ASSESSMENT: This is a very pleasant 79 years old [place] American male with questionable metastatic renal cell carcinoma with bilateral pulmonary nodules.    PLAN: I have a lengthy discussion with the patient and his wife today about his current disease status and treatment options. This is highly suspicious for renal cell carcinoma but a tissue diagnosis would be very valuable to confirm his condition. The patient is scheduled for a PET scan on 04/26/2013. We would consider biopsy of any accessible lesion other than the pulmonary nodule if available, otherwise we may consider fine-needle aspiration or core biopsy by interventional radiology for the pulmonary nodule. Unfortunately  the patient has a lot of comorbidities and does not show definitely be a good candidate for systemic therapy if we confirm a diagnosis of metastatic renal cell carcinoma but this will be discussed with the patient in details after we confirm the diagnosis. I will complete the staging workup I ordered an MRI of the brain to rule out brain metastasis. I will arrange for the patient to come back for followup visit in 2 weeks for reevaluation and more detailed discussion of his options after the PET scan. The patient was advised to call immediately if he has any concerning symptoms in the interval.  The patient voices understanding of current disease status and treatment options and is in agreement with the current care plan.  All questions were answered. The patient knows to call the clinic with any problems, questions or concerns. We can certainly see the patient much sooner if necessary.  Thank you so much for allowing me to participate in the care of [name]. I will continue to follow up the patient with you and assist in his care.  I spent 40 minutes counseling the patient face to face. The total time spent in the appointment was 60 minutes.  Dustin Bumbaugh K. 04/22/2013, 3:14 PM

## 2013-04-22 NOTE — Telephone Encounter (Signed)
appts made and printed. Pt is aware that cs will call w/ appt for MRI Brain.Steve Kitchentd

## 2013-04-22 NOTE — Patient Instructions (Signed)
Followup visit in 2 weeks. 

## 2013-04-22 NOTE — Progress Notes (Signed)
Checked in new pateint with no financial issues.  °

## 2013-04-26 ENCOUNTER — Encounter (HOSPITAL_COMMUNITY): Payer: Medicare Other

## 2013-04-29 ENCOUNTER — Telehealth: Payer: Self-pay

## 2013-04-29 NOTE — Telephone Encounter (Signed)
[facility] from [facility] called concerning CT Biopsy. Dr. Gwenette Greet has cancelled his order and no other order has been put in. Stated note had been sent from her to Dr. Please advise, Thanks!

## 2013-05-03 ENCOUNTER — Encounter (HOSPITAL_COMMUNITY)
Admission: RE | Admit: 2013-05-03 | Discharge: 2013-05-03 | Disposition: A | Discharge status: Home / Self Care | Payer: Medicare Other | Source: Ambulatory Visit | Attending: Pulmonary Disease | Admitting: Pulmonary Disease

## 2013-05-03 ENCOUNTER — Encounter (HOSPITAL_COMMUNITY): Payer: Self-pay

## 2013-05-03 ENCOUNTER — Ambulatory Visit (HOSPITAL_COMMUNITY)
Admission: RE | Admit: 2013-05-03 | Discharge: 2013-05-03 | Disposition: A | Discharge status: Home / Self Care | Payer: Medicare Other | Source: Ambulatory Visit | Attending: Internal Medicine | Admitting: Internal Medicine

## 2013-05-03 DIAGNOSIS — N289 Disorder of kidney and ureter, unspecified: Secondary | ICD-10-CM | POA: Insufficient documentation

## 2013-05-03 DIAGNOSIS — R918 Other nonspecific abnormal finding of lung field: Secondary | ICD-10-CM | POA: Insufficient documentation

## 2013-05-03 DIAGNOSIS — C7800 Secondary malignant neoplasm of unspecified lung: Secondary | ICD-10-CM | POA: Insufficient documentation

## 2013-05-03 DIAGNOSIS — N19 Unspecified kidney failure: Secondary | ICD-10-CM | POA: Insufficient documentation

## 2013-05-03 DIAGNOSIS — Z85528 Personal history of other malignant neoplasm of kidney: Secondary | ICD-10-CM | POA: Insufficient documentation

## 2013-05-03 DIAGNOSIS — I6789 Other cerebrovascular disease: Secondary | ICD-10-CM | POA: Insufficient documentation

## 2013-05-03 DIAGNOSIS — G319 Degenerative disease of nervous system, unspecified: Secondary | ICD-10-CM | POA: Insufficient documentation

## 2013-05-03 DIAGNOSIS — J90 Pleural effusion, not elsewhere classified: Secondary | ICD-10-CM | POA: Insufficient documentation

## 2013-05-03 LAB — GLUCOSE, CAPILLARY: Glucose-Capillary: 100 mg/dL — ABNORMAL HIGH (ref 70–99)

## 2013-05-03 MED ORDER — FLUDEOXYGLUCOSE F - 18 (FDG) INJECTION
19.8000 | Freq: Once | INTRAVENOUS | Status: AC | PRN
  Administered 2013-05-03: 19.8 via INTRAVENOUS

## 2013-05-06 ENCOUNTER — Other Ambulatory Visit: Payer: Self-pay | Admitting: Pulmonary Disease

## 2013-05-06 DIAGNOSIS — J90 Pleural effusion, not elsewhere classified: Secondary | ICD-10-CM

## 2013-05-07 ENCOUNTER — Other Ambulatory Visit (HOSPITAL_BASED_OUTPATIENT_CLINIC_OR_DEPARTMENT_OTHER): Payer: Medicare Other

## 2013-05-07 ENCOUNTER — Encounter: Payer: Self-pay | Admitting: Internal Medicine

## 2013-05-07 ENCOUNTER — Ambulatory Visit (HOSPITAL_COMMUNITY)
Admission: RE | Admit: 2013-05-07 | Discharge: 2013-05-07 | Disposition: A | Discharge status: Home / Self Care | Payer: Medicare Other | Source: Ambulatory Visit | Attending: Pulmonary Disease | Admitting: Pulmonary Disease

## 2013-05-07 ENCOUNTER — Ambulatory Visit (HOSPITAL_BASED_OUTPATIENT_CLINIC_OR_DEPARTMENT_OTHER): Payer: Medicare Other | Admitting: Internal Medicine

## 2013-05-07 ENCOUNTER — Telehealth: Payer: Self-pay | Admitting: Internal Medicine

## 2013-05-07 ENCOUNTER — Ambulatory Visit (HOSPITAL_COMMUNITY)
Admission: RE | Admit: 2013-05-07 | Discharge: 2013-05-07 | Disposition: A | Discharge status: Home / Self Care | Payer: Medicare Other | Source: Ambulatory Visit | Attending: Radiology | Admitting: Radiology

## 2013-05-07 VITALS — BP 129/81 | HR 79 | Temp 97.1°F | Resp 18 | Ht 74.0 in | Wt 132.1 lb

## 2013-05-07 DIAGNOSIS — I517 Cardiomegaly: Secondary | ICD-10-CM | POA: Insufficient documentation

## 2013-05-07 DIAGNOSIS — E119 Type 2 diabetes mellitus without complications: Secondary | ICD-10-CM

## 2013-05-07 DIAGNOSIS — R918 Other nonspecific abnormal finding of lung field: Secondary | ICD-10-CM | POA: Diagnosis not present

## 2013-05-07 DIAGNOSIS — J90 Pleural effusion, not elsewhere classified: Secondary | ICD-10-CM | POA: Diagnosis not present

## 2013-05-07 DIAGNOSIS — R222 Localized swelling, mass and lump, trunk: Secondary | ICD-10-CM | POA: Diagnosis not present

## 2013-05-07 DIAGNOSIS — I2510 Atherosclerotic heart disease of native coronary artery without angina pectoris: Secondary | ICD-10-CM

## 2013-05-07 DIAGNOSIS — R911 Solitary pulmonary nodule: Secondary | ICD-10-CM

## 2013-05-07 DIAGNOSIS — J9819 Other pulmonary collapse: Secondary | ICD-10-CM | POA: Diagnosis not present

## 2013-05-07 DIAGNOSIS — I10 Essential (primary) hypertension: Secondary | ICD-10-CM

## 2013-05-07 DIAGNOSIS — R5381 Other malaise: Secondary | ICD-10-CM

## 2013-05-07 DIAGNOSIS — R5383 Other fatigue: Secondary | ICD-10-CM

## 2013-05-07 DIAGNOSIS — N2889 Other specified disorders of kidney and ureter: Secondary | ICD-10-CM

## 2013-05-07 LAB — COMPREHENSIVE METABOLIC PANEL (CC13)
ALK PHOS: 119 U/L (ref 40–150)
ALT: 21 U/L (ref 0–55)
AST: 35 U/L — ABNORMAL HIGH (ref 5–34)
Albumin: 3.6 g/dL (ref 3.5–5.0)
Anion Gap: 15 mEq/L — ABNORMAL HIGH (ref 3–11)
BUN: 64.3 mg/dL — AB (ref 7.0–26.0)
CO2: 17 mEq/L — ABNORMAL LOW (ref 22–29)
Calcium: 8.7 mg/dL (ref 8.4–10.4)
Chloride: 111 mEq/L — ABNORMAL HIGH (ref 98–109)
Creatinine: 2.8 mg/dL — ABNORMAL HIGH (ref 0.7–1.3)
GLUCOSE: 224 mg/dL — AB (ref 70–140)
Potassium: 4.4 mEq/L (ref 3.5–5.1)
Sodium: 143 mEq/L (ref 136–145)
Total Bilirubin: 0.87 mg/dL (ref 0.20–1.20)
Total Protein: 7.5 g/dL (ref 6.4–8.3)

## 2013-05-07 LAB — CBC WITH DIFFERENTIAL/PLATELET
BASO%: 0.4 % (ref 0.0–2.0)
BASOS ABS: 0 10*3/uL (ref 0.0–0.1)
EOS%: 2.4 % (ref 0.0–7.0)
Eosinophils Absolute: 0.1 10*3/uL (ref 0.0–0.5)
HEMATOCRIT: 39.6 % (ref 38.4–49.9)
HGB: 12.6 g/dL — ABNORMAL LOW (ref 13.0–17.1)
LYMPH%: 15.5 % (ref 14.0–49.0)
MCH: 27.7 pg (ref 27.2–33.4)
MCHC: 31.8 g/dL — ABNORMAL LOW (ref 32.0–36.0)
MCV: 87 fL (ref 79.3–98.0)
MONO#: 0.4 10*3/uL (ref 0.1–0.9)
MONO%: 7.3 % (ref 0.0–14.0)
NEUT#: 4 10*3/uL (ref 1.5–6.5)
NEUT%: 74.4 % (ref 39.0–75.0)
Platelets: 117 10*3/uL — ABNORMAL LOW (ref 140–400)
RBC: 4.55 10*6/uL (ref 4.20–5.82)
RDW: 17.1 % — ABNORMAL HIGH (ref 11.0–14.6)
WBC: 5.4 10*3/uL (ref 4.0–10.3)
lymph#: 0.8 10*3/uL — ABNORMAL LOW (ref 0.9–3.3)
nRBC: 0 % (ref 0–0)

## 2013-05-07 LAB — GLUCOSE, SEROUS FLUID: GLUCOSE FL: 98 mg/dL

## 2013-05-07 LAB — BODY FLUID CELL COUNT WITH DIFFERENTIAL
EOS FL: 0 %
Lymphs, Fluid: 55 %
Monocyte-Macrophage-Serous Fluid: 44 % — ABNORMAL LOW (ref 50–90)
Neutrophil Count, Fluid: 1 % (ref 0–25)
Other Cells, Fluid: 0 %
Total Nucleated Cell Count, Fluid: 163 cu mm (ref 0–1000)

## 2013-05-07 LAB — PROTEIN, BODY FLUID: Total protein, fluid: 3.3 g/dL

## 2013-05-07 LAB — LACTATE DEHYDROGENASE, PLEURAL OR PERITONEAL FLUID: LD, Fluid: 169 U/L — ABNORMAL HIGH (ref 3–23)

## 2013-05-07 NOTE — Procedures (Signed)
Successful US guided left thoracentesis. Yielded 1.1 liters of clear serous fluid. Pt tolerated procedure well. No immediate complications.  Specimen was sent for labs. CXR ordered.  Tsosie Billing D PA-C 05/07/2013 12:17 PM

## 2013-05-07 NOTE — Patient Instructions (Signed)
Smoking Cessation Quitting smoking is important to your health and has many advantages. However, it is not always easy to quit since nicotine is a very addictive drug. Often times, people try 3 times or more before being able to quit. This document explains the best ways for you to prepare to quit smoking. Quitting takes hard work and a lot of effort, but you can do it. ADVANTAGES OF QUITTING SMOKING  You will live longer, feel better, and live better.  Your body will feel the impact of quitting smoking almost immediately.  Within 20 minutes, blood pressure decreases. Your pulse returns to its normal level.  After 8 hours, carbon monoxide levels in the blood return to normal. Your oxygen level increases.  After 24 hours, the chance of having a heart attack starts to decrease. Your breath, hair, and body stop smelling like smoke.  After 48 hours, damaged nerve endings begin to recover. Your sense of taste and smell improve.  After 72 hours, the body is virtually free of nicotine. Your bronchial tubes relax and breathing becomes easier.  After 2 to 12 weeks, lungs can hold more air. Exercise becomes easier and circulation improves.  The risk of having a heart attack, stroke, cancer, or lung disease is greatly reduced.  After 1 year, the risk of coronary heart disease is cut in half.  After 5 years, the risk of stroke falls to the same as a nonsmoker.  After 10 years, the risk of lung cancer is cut in half and the risk of other cancers decreases significantly.  After 15 years, the risk of coronary heart disease drops, usually to the level of a nonsmoker.  If you are pregnant, quitting smoking will improve your chances of having a healthy baby.  The people you live with, especially any children, will be healthier.  You will have extra money to spend on things other than cigarettes. QUESTIONS TO THINK ABOUT BEFORE ATTEMPTING TO QUIT You may want to talk about your answers with your  caregiver.  Why do you want to quit?  If you tried to quit in the past, what helped and what did not?  What will be the most difficult situations for you after you quit? How will you plan to handle them?  Who can help you through the tough times? Your family? Friends? A caregiver?  What pleasures do you get from smoking? What ways can you still get pleasure if you quit? Here are some questions to ask your caregiver:  How can you help me to be successful at quitting?  What medicine do you think would be best for me and how should I take it?  What should I do if I need more help?  What is smoking withdrawal like? How can I get information on withdrawal? GET READY  Set a quit date.  Change your environment by getting rid of all cigarettes, ashtrays, matches, and lighters in your home, car, or work. Do not let people smoke in your home.  Review your past attempts to quit. Think about what worked and what did not. GET SUPPORT AND ENCOURAGEMENT You have a better chance of being successful if you have help. You can get support in many ways.  Tell your family, friends, and co-workers that you are going to quit and need their support. Ask them not to smoke around you.  Get individual, group, or telephone counseling and support. Programs are available at local hospitals and health centers. Call your local health department for   information about programs in your area.  Spiritual beliefs and practices may help some smokers quit.  Download a "quit meter" on your computer to keep track of quit statistics, such as how long you have gone without smoking, cigarettes not smoked, and money saved.  Get a self-help book about quitting smoking and staying off of tobacco. LEARN NEW SKILLS AND BEHAVIORS  Distract yourself from urges to smoke. Talk to someone, go for a walk, or occupy your time with a task.  Change your normal routine. Take a different route to work. Drink tea instead of coffee.  Eat breakfast in a different place.  Reduce your stress. Take a hot bath, exercise, or read a book.  Plan something enjoyable to do every day. Reward yourself for not smoking.  Explore interactive web-based programs that specialize in helping you quit. GET MEDICINE AND USE IT CORRECTLY Medicines can help you stop smoking and decrease the urge to smoke. Combining medicine with the above behavioral methods and support can greatly increase your chances of successfully quitting smoking.  Nicotine replacement therapy helps deliver nicotine to your body without the negative effects and risks of smoking. Nicotine replacement therapy includes nicotine gum, lozenges, inhalers, nasal sprays, and skin patches. Some may be available over-the-counter and others require a prescription.  Antidepressant medicine helps people abstain from smoking, but how this works is unknown. This medicine is available by prescription.  Nicotinic receptor partial agonist medicine simulates the effect of nicotine in your brain. This medicine is available by prescription. Ask your caregiver for advice about which medicines to use and how to use them based on your health history. Your caregiver will tell you what side effects to look out for if you choose to be on a medicine or therapy. Carefully read the information on the package. Do not use any other product containing nicotine while using a nicotine replacement product.  RELAPSE OR DIFFICULT SITUATIONS Most relapses occur within the first 3 months after quitting. Do not be discouraged if you start smoking again. Remember, most people try several times before finally quitting. You may have symptoms of withdrawal because your body is used to nicotine. You may crave cigarettes, be irritable, feel very hungry, cough often, get headaches, or have difficulty concentrating. The withdrawal symptoms are only temporary. They are strongest when you first quit, but they will go away within  10 14 days. To reduce the chances of relapse, try to:  Avoid drinking alcohol. Drinking lowers your chances of successfully quitting.  Reduce the amount of caffeine you consume. Once you quit smoking, the amount of caffeine in your body increases and can give you symptoms, such as a rapid heartbeat, sweating, and anxiety.  Avoid smokers because they can make you want to smoke.  Do not let weight gain distract you. Many smokers will gain weight when they quit, usually less than 10 pounds. Eat a healthy diet and stay active. You can always lose the weight gained after you quit.  Find ways to improve your mood other than smoking. FOR MORE INFORMATION  www.smokefree.gov  Document Released: 04/05/2001 Document Revised: 10/11/2011 Document Reviewed: 07/21/2011 [employer] Patient Information 2014 [employer].  

## 2013-05-07 NOTE — Telephone Encounter (Signed)
gv and printed appt sched an [facility] for pt for Jan.. °

## 2013-05-07 NOTE — Progress Notes (Signed)
[facility] [phone]   [phone]  OFFICE PROGRESS NOTE  Renato Shin, MD [address] [place] [address]  DIAGNOSIS: questionable metastatic renal cell carcinoma  PRIOR THERAPY: None  CURRENT THERAPY: None  INTERVAL HISTORY: [name] 80 y.o. male returns to the clinic today for follow up visit accompanied by his son. The patient is feeling fine today with no specific complaints except for mild fatigue and shortness of breath which is improved after he underwent ultrasound guided left thoracentesis earlier today. He denied having any significant fever or chills. He denied having any significant weight loss or night sweats. He has no nausea or vomiting. The patient underwent several studies recently including MRI of the brain that showed no evidence for metastatic disease to the brain. He also had a PET scan and he is here for evaluation and discussion of his scan results.   MEDICAL HISTORY: Past Medical History  Diagnosis Date  . Hypertension   . Anemia   . PAD (peripheral artery disease) 10/10    Angiogram  Dr Trula Slade , severe stenosis left profunda after insertion of aorto-bifemoral  bypass graft-total occulsionof left SFA. Collateral flow through the profunda femoral artery.  Marland Kitchen CAD in native artery 2003    by cath   . Hypercholesteremia   . Thyroid disease   . Thyroiditis, unspecified   . Osteoarthritis of spine   . Olecranon bursitis   . 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 GERD (gastroesophageal reflux disease)   . Diabetes mellitus   . History of colonic polyps   . Iron deficiency anemia, unspecified   . Leg pain   . Numbness   . History of colonoscopy   . Chronic kidney disease   . History of blood transfusion   . Stroke     ALLERGIES:  is allergic to clopidogrel bisulfate.  MEDICATIONS:  Current Outpatient Prescriptions  Medication Sig Dispense Refill  . atenolol  (TENORMIN) 25 MG tablet Take 1 tablet (25 mg total) by mouth every morning.  30 tablet  3  . atorvastatin (LIPITOR) 40 MG tablet Take 1 tablet (40 mg total) by mouth daily.  90 tablet  3  . calcitRIOL (ROCALTROL) 0.5 MCG capsule Take 0.5 mcg by mouth daily.       Marland Kitchen dipyridamole-aspirin (AGGRENOX) 200-25 MG per 12 hr capsule Take 1 capsule by mouth 2 (two) times daily.  180 capsule  3  . furosemide (LASIX) 80 MG tablet       . glucose blood (ONE TOUCH ULTRA TEST) test strip 1 each by Other route 3 (three) times daily. And lancets 3/day 250.01  270 each  12  . hydrochlorothiazide (HYDRODIURIL) 25 MG tablet Take 25 mg by mouth every morning.      Marland Kitchen HYDROcodone-acetaminophen (NORCO) 10-325 MG per tablet Take 1 tablet by mouth every 4 (four) hours as needed.  100 tablet  0  . Insulin Lispro Prot & Lispro (HUMALOG MIX 75/25 KWIKPEN) (75-25) 100 UNIT/ML SUPN Inject under the skin twice daily 13 units with breakfast and 9 units with evening meal  40 mL  1  . Insulin Pen Needle 31G X 8 MM MISC 1 Device by Does not apply route 2 (two) times daily.  180 each  3  . sildenafil (VIAGRA) 100 MG tablet Take 1 tablet (100 mg total) by mouth daily as needed. For sexual activity  5 tablet  11  No current facility-administered medications for this visit.    SURGICAL HISTORY:  Past Surgical History  Procedure Laterality Date  . Appendectomy    . Aortobifemoral bypass  2002  . Electrocardiogram  01/12/2006  . Stress cardiolite  04/14/2003  . Pr vein bypass graft,aorto-fem-pop  2002  . Renal artery stent  11/16/2010    Right renal artery  . Eye surgery      Cataract  . Embolectomy  03/08/2012    Procedure: EMBOLECTOMY;  Surgeon: Mal Misty, MD;  Location: [facility] OR;  Service: Vascular;  Laterality: Left;  THROMBECTOMY OF LEFT LIMB OF AORTO-FEMORAL BYPASS GRAFT  . Patch angioplasty  03/08/2012    Procedure: PATCH ANGIOPLASTY;  Surgeon: Mal Misty, MD;  Location: [facility];  Service: Vascular;  Laterality:  Left;  Left fropunda endarterectomy with patch angioplasty    REVIEW OF SYSTEMS:  Constitutional: positive for fatigue Eyes: negative Ears, nose, mouth, throat, and face: negative Respiratory: positive for dyspnea on exertion Cardiovascular: negative Gastrointestinal: negative Genitourinary:negative Integument/breast: negative Hematologic/lymphatic: negative Musculoskeletal:positive for muscle weakness Neurological: negative Behavioral/Psych: negative Endocrine: negative Allergic/Immunologic: negative   PHYSICAL EXAMINATION: General appearance: alert, cooperative and fatigued Head: Normocephalic, without obvious abnormality, atraumatic Neck: no adenopathy, no JVD, supple, symmetrical, trachea midline and thyroid not enlarged, symmetric, no tenderness/mass/nodules Lymph nodes: Cervical, supraclavicular, and axillary nodes normal. Resp: diminished breath sounds LLL and dullness to percussion LLL Back: symmetric, no curvature. ROM normal. No CVA tenderness. Cardio: regular rate and rhythm, S1, S2 normal, no murmur, click, rub or gallop GI: soft, non-tender; bowel sounds normal; no masses,  no organomegaly Extremities: extremities normal, atraumatic, no cyanosis or edema Neurologic: Alert and oriented X 3, normal strength and tone. Normal symmetric reflexes. Normal coordination and gait  ECOG PERFORMANCE STATUS: 2 - Symptomatic, <50% confined to bed  Blood pressure 129/81, pulse 79, temperature 97.1 F (36.2 C), temperature source Oral, resp. rate 18, height 6\' 2"  (1.88 m), weight 132 lb 1.6 oz (59.92 kg).  LABORATORY DATA: Lab Results  Component Value Date   WBC 5.4 05/07/2013   HGB 12.6* 05/07/2013   HCT 39.6 05/07/2013   MCV 87.0 05/07/2013   PLT 117* 05/07/2013      Chemistry      Component Value Date/Time   NA 143 04/22/2013 1342   NA 139 04/10/2013 1049   K 3.9 04/22/2013 1342   K 4.2 04/10/2013 1049   CL 111 04/10/2013 1049   CO2 16* 04/22/2013 1342   CO2 18*  04/10/2013 1049   BUN 44.7* 04/22/2013 1342   BUN 47* 04/10/2013 1049   CREATININE 2.7* 04/22/2013 1342   CREATININE 2.6* 04/10/2013 1049   CREATININE 1.84* 09/10/2012 0948      Component Value Date/Time   CALCIUM 8.4 04/22/2013 1342   CALCIUM 8.7 04/10/2013 1049   CALCIUM 8.9 08/23/2012 0447   ALKPHOS 115 04/22/2013 1342   ALKPHOS 109 04/10/2013 1049   AST 25 04/22/2013 1342   AST 35 04/10/2013 1049   ALT 18 04/22/2013 1342   ALT 21 04/10/2013 1049   BILITOT 0.81 04/22/2013 1342   BILITOT 0.9 04/10/2013 1049       RADIOGRAPHIC STUDIES: Ct Abdomen Pelvis Wo Contrast  04/12/2013   CLINICAL DATA:  Lung nodules on chest radiograph. History of smoking and COPD as well as renal failure  EXAM: CT CHEST, ABDOMEN AND PELVIS WITHOUT CONTRAST  TECHNIQUE: Multidetector CT imaging of the chest, abdomen and pelvis was performed following the standard protocol without IV contrast.  COMPARISON:  CT 06/02/2011, MRI 04/05/2012, 10/09/2012 is  FINDINGS: CT CHEST FINDINGS  There are bilateral round pulmonary nodules of varying. There is a left upper lobe mass. The mass measures 3.5 cm in the medial left upper lobe (image 17). Example nodule in the right lower lobe measures 16 mm (image 46, series 3). Index nodule in the left lung measures 10 mm (image 21). There approximately 12 nodules combined in the left and right lungs.  No axillary or supraclavicular lymphadenopathy evident on this noncontrast exam. No clear mediastinal adenopathy. No clear hilar adenopathy. No pericardial fluid. Esophagus is grossly normal.  There bilateral small pleural effusions greater on the left.  CT ABDOMEN AND PELVIS FINDINGS  No focal hepatic lesion on this noncontrast exam. The gallbladder is distended. There is extensive calcification of the pancreas with duct dilatation suggesting chronic pancreatitis. Spleen is small. Adrenal glands are grossly normal with difficult a evaluate. There is a mass in the upper pole of the left  kidney measuring 4 cm (image 19, series 2). This compares to the 4 cm cryoablation site seen on comparison MRI 10/09/2012. This has soft tissue attenuation and central calcifications.  This stomach were, small bowel a and colon are unremarkable. Abdominal aorta is heavily calcified. There is a repair of abdominal aorta noted.  Bladder and prostate gland appear normal. No pelvic lymphadenopathy. There is anasarca the soft tissues of the thorax and abdomen. View of the bone windows demonstrates no aggressive osseous lesion.  IMPRESSION: 1. Bilateral pulmonary nodule and left upper lobe pulmonary mass consistent with pulmonary metastasis. 2. Soft tissue density measuring 4 cm upper pole of the left kidney corresponds to the cryoablation site seen on comparison exams. Recommend re-evaluation with MRI with and without contrast if renal function allows. 3. Bilateral pleural effusions and anasarca of the soft tissues. 4. Chronic pancreatitis. These results will be called to the ordering clinician or representative by the Radiologist Assistant, and communication documented in the PACS Dashboard.   Electronically Signed   By: Suzy Bouchard M.D.   On: 04/12/2013 10:27   Dg Chest 1 View  05/07/2013   CLINICAL DATA:  Post left-sided thoracentesis.  EXAM: CHEST - 1 VIEW  COMPARISON:  03/09/2012 chest x-ray.  05/03/2013 PET-CT.  FINDINGS: When compared to the PET CT, decrease in size of left-sided pleural effusion. Left pleural fluid remains with left base atelectasis. No gross pneumothorax.  Mass left upper lobe and scattered smaller pulmonary nodules once again noted, better defined on PET-CT.  Pulmonary vascular prominence.  Cardiomegaly.  Prominent vascular calcifications.  IMPRESSION: When compared to the PET CT, decrease in size of left-sided pleural effusion. Left pleural fluid remains with left base atelectasis. No gross pneumothorax.  Mass left upper lobe and scattered smaller pulmonary nodules once again noted,  better defined on PET-CT.  Pulmonary vascular prominence.  Cardiomegaly.   Electronically Signed   By: Chauncey Cruel M.D.   On: 05/07/2013 12:40   Dg Chest 2 View  04/10/2013   CLINICAL DATA:  Weight loss.  Smoker.  Renal cell carcinoma  EXAM: CHEST  2 VIEW  COMPARISON:  02/28/2012  FINDINGS: COPD with pulmonary hyperinflation. Cardiac enlargement. Negative for edema. Small pleural effusions are present. Mild bibasilar airspace disease.  2 cm left upper lobe mass lesion. Question additional multiple pulmonary nodules.  IMPRESSION: COPD.  Cardiac enlargement with bilateral small pleural effusions. Negative for edema.  Bilateral pulmonary nodules are present. This pattern is suspicious for metastatic disease. CT chest with contrast  suggested for further evaluation.   Electronically Signed   By: Franchot Gallo M.D.   On: 04/10/2013 13:09   Ct Chest Wo Contrast  04/12/2013   CLINICAL DATA:  Lung nodules on chest radiograph. History of smoking and COPD as well as renal failure  EXAM: CT CHEST, ABDOMEN AND PELVIS WITHOUT CONTRAST  TECHNIQUE: Multidetector CT imaging of the chest, abdomen and pelvis was performed following the standard protocol without IV contrast.  COMPARISON:  CT 06/02/2011, MRI 04/05/2012, 10/09/2012 is  FINDINGS: CT CHEST FINDINGS  There are bilateral round pulmonary nodules of varying. There is a left upper lobe mass. The mass measures 3.5 cm in the medial left upper lobe (image 17). Example nodule in the right lower lobe measures 16 mm (image 46, series 3). Index nodule in the left lung measures 10 mm (image 21). There approximately 12 nodules combined in the left and right lungs.  No axillary or supraclavicular lymphadenopathy evident on this noncontrast exam. No clear mediastinal adenopathy. No clear hilar adenopathy. No pericardial fluid. Esophagus is grossly normal.  There bilateral small pleural effusions greater on the left.  CT ABDOMEN AND PELVIS FINDINGS  No focal hepatic lesion on  this noncontrast exam. The gallbladder is distended. There is extensive calcification of the pancreas with duct dilatation suggesting chronic pancreatitis. Spleen is small. Adrenal glands are grossly normal with difficult a evaluate. There is a mass in the upper pole of the left kidney measuring 4 cm (image 19, series 2). This compares to the 4 cm cryoablation site seen on comparison MRI 10/09/2012. This has soft tissue attenuation and central calcifications.  This stomach were, small bowel a and colon are unremarkable. Abdominal aorta is heavily calcified. There is a repair of abdominal aorta noted.  Bladder and prostate gland appear normal. No pelvic lymphadenopathy. There is anasarca the soft tissues of the thorax and abdomen. View of the bone windows demonstrates no aggressive osseous lesion.  IMPRESSION: 1. Bilateral pulmonary nodule and left upper lobe pulmonary mass consistent with pulmonary metastasis. 2. Soft tissue density measuring 4 cm upper pole of the left kidney corresponds to the cryoablation site seen on comparison exams. Recommend re-evaluation with MRI with and without contrast if renal function allows. 3. Bilateral pleural effusions and anasarca of the soft tissues. 4. Chronic pancreatitis. These results will be called to the ordering clinician or representative by the Radiologist Assistant, and communication documented in the PACS Dashboard.   Electronically Signed   By: Suzy Bouchard M.D.   On: 04/12/2013 10:27   Mr Brain Wo Contrast  05/03/2013   CLINICAL DATA:  Pulmonary nodules. Rule out metastatic disease. Renal failure.  EXAM: MRI HEAD WITHOUT CONTRAST  TECHNIQUE: Multiplanar, multiecho pulse sequences of the brain and surrounding structures were obtained without intravenous contrast.  COMPARISON:  MRI 07/06/2006  FINDINGS: Intravenous contrast was not administered due to renal failure.  Moderate atrophy. Mild chronic microvascular ischemic changes in the white matter. Chronic lacune  right thalamus. Negative for acute infarct.  Negative for mass or edema.  No intracranial hemorrhage.  CSF fluid collection lateral to the left cerebellum was present in 2008 and appears benign  IMPRESSION: Atrophy and chronic microvascular ischemic change. No acute infarct or mass.  Negative for metastatic disease on unenhanced images.   Electronically Signed   By: Franchot Gallo M.D.   On: 05/03/2013 10:38   Nm Pet Image Initial (pi) Skull Base To Thigh  05/03/2013   CLINICAL DATA:  Initial treatment strategy for lung  lesions. History of renal cell cancer.  EXAM: NUCLEAR MEDICINE PET SKULL BASE TO THIGH  FASTING BLOOD GLUCOSE:  Value: 100mg /dl  TECHNIQUE: 19.8 mCi F-18 FDG was injected intravenously. CT data was obtained and used for attenuation correction and anatomic localization only. (This was not acquired as a diagnostic CT examination.) Additional exam technical data entered on technologist worksheet.  COMPARISON:  CT scan 04/12/2013  FINDINGS: NECK  No hypermetabolic lymph nodes in the neck.  CHEST  Numerous bilateral pulmonary nodules demonstrating neoplastic range FDG uptake with SUV max ranging between 2.0 and 6.0. Findings consistent with pulmonary metastatic disease. There is also a large left pleural effusion, increased since prior CT scan. There are pleural masses also demonstrating FDG uptake of between 3.0 and 5.6.  ABDOMEN/PELVIS  The left renal mass has been previously treated. There is FDG uptake with SUV max is 6.5. Findings suspicious for residual tumor but post ablation changes also possible.  SKELETON  No focal hypermetabolic activity to suggest skeletal metastasis.  IMPRESSION: 1. Diffuse pulmonary metastatic disease. 2. Enlarging left-sided pleural effusion with FDG positive pleural nodules consistent with pleural metastasis. 3. Neoplastic range uptake in the left upper lobe renal mass which has been previously treated. Findings could be due to post ablation changes or persistent tumor.  4. No findings for osseous metastatic disease.   Electronically Signed   By: Kalman Jewels M.D.   On: 05/03/2013 12:11   US Thoracentesis Asp Pleural Space W/img Guide  05/07/2013   CLINICAL DATA:  Left pleural effusion  EXAM: ULTRASOUND GUIDED left THORACENTESIS  COMPARISON:  None.  FINDINGS: A total of approximately 1.1 liters of clear serous fluid was removed. A fluid sample wassent for laboratory analysis.  IMPRESSION: Successful ultrasound guided left thoracentesis yielding 1.1 liters of pleural fluid.  Read By:  Tsosie Billing PA-C  PROCEDURE: An ultrasound guided thoracentesis was thoroughly discussed with the patient and questions answered. The benefits, risks, alternatives and complications were also discussed. The patient understands and wishes to proceed with the procedure. Written consent was obtained.  Ultrasound was performed to localize and mark an adequate pocket of fluid in the left chest. The area was then prepped and draped in the normal sterile fashion. 1% Lidocaine was used for local anesthesia. Under ultrasound guidance a 19 gauge Yueh catheter was introduced. Thoracentesis was performed. The catheter was removed and a dressing applied.  Complications:  none   Electronically Signed   By: Arne Cleveland M.D.   On: 05/07/2013 12:59    ASSESSMENT AND PLAN: this is a very pleasant 80 years old [place] American male with questionable metastatic renal cell carcinoma with bilateral pulmonary nodules in addition to large left pleural effusion status post ultrasound-guided left thoracentesis earlier today with drainage of 1.1 L of pleural fluid. The pleural fluid was sent for cytologic evaluation. This may help [place] to establish a diagnosis.  I have a lengthy discussion with the patient and his son today about his current status. I also discussed his case at the weekly thoracic conference and the recommendation was to consider CT-guided biopsy of the left upper lobe lung mass if the left sided  thoracentesis was negative for malignancy. I recommended for the patient to come back for follow up visit in one week for evaluation once the final cytology is available for discussion of his treatment options or arrangements of  CT guided biopsy of the left upper lobe lung mass if needed. The patient and his son agree to the current  plan. The patient voices understanding of current disease status and treatment options and is in agreement with the current care plan.  All questions were answered. The patient knows to call the clinic with any problems, questions or concerns. We can certainly see the patient much sooner if necessary.  I spent 15 minutes counseling the patient face to face. The total time spent in the appointment was 25 minutes.  Disclaimer: This note was dictated with voice recognition software. Similar sounding words can inadvertently be transcribed and may not be corrected upon review.

## 2013-05-10 ENCOUNTER — Other Ambulatory Visit: Payer: Self-pay | Admitting: *Deleted

## 2013-05-10 MED ORDER — HYDROCODONE-ACETAMINOPHEN 10-325 MG PO TABS: 1.0000 | ORAL_TABLET | ORAL | Status: DC | PRN

## 2013-05-11 LAB — BODY FLUID CULTURE: Culture: NO GROWTH

## 2013-05-13 ENCOUNTER — Other Ambulatory Visit: Payer: Self-pay | Admitting: *Deleted

## 2013-05-13 MED ORDER — HYDROCHLOROTHIAZIDE 25 MG PO TABS: 25.0000 mg | ORAL_TABLET | Freq: Every morning | ORAL | Status: DC

## 2013-05-16 ENCOUNTER — Other Ambulatory Visit (HOSPITAL_BASED_OUTPATIENT_CLINIC_OR_DEPARTMENT_OTHER): Payer: Medicare Other

## 2013-05-16 ENCOUNTER — Ambulatory Visit (HOSPITAL_BASED_OUTPATIENT_CLINIC_OR_DEPARTMENT_OTHER): Payer: Medicare Other | Admitting: Internal Medicine

## 2013-05-16 ENCOUNTER — Encounter: Payer: Self-pay | Admitting: Internal Medicine

## 2013-05-16 VITALS — BP 124/78 | HR 92 | Temp 97.9°F | Resp 18 | Ht 74.0 in | Wt 152.8 lb

## 2013-05-16 DIAGNOSIS — R5381 Other malaise: Secondary | ICD-10-CM

## 2013-05-16 DIAGNOSIS — C649 Malignant neoplasm of unspecified kidney, except renal pelvis: Secondary | ICD-10-CM

## 2013-05-16 DIAGNOSIS — R5383 Other fatigue: Secondary | ICD-10-CM

## 2013-05-16 DIAGNOSIS — N2889 Other specified disorders of kidney and ureter: Secondary | ICD-10-CM

## 2013-05-16 DIAGNOSIS — R911 Solitary pulmonary nodule: Secondary | ICD-10-CM

## 2013-05-16 DIAGNOSIS — J90 Pleural effusion, not elsewhere classified: Secondary | ICD-10-CM

## 2013-05-16 DIAGNOSIS — R222 Localized swelling, mass and lump, trunk: Secondary | ICD-10-CM

## 2013-05-16 DIAGNOSIS — R0602 Shortness of breath: Secondary | ICD-10-CM

## 2013-05-16 LAB — COMPREHENSIVE METABOLIC PANEL (CC13)
ALBUMIN: 3.6 g/dL (ref 3.5–5.0)
ALT: 20 U/L (ref 0–55)
AST: 28 U/L (ref 5–34)
Alkaline Phosphatase: 98 U/L (ref 40–150)
Anion Gap: 12 mEq/L — ABNORMAL HIGH (ref 3–11)
BUN: 61.8 mg/dL — ABNORMAL HIGH (ref 7.0–26.0)
CALCIUM: 9 mg/dL (ref 8.4–10.4)
CHLORIDE: 114 meq/L — AB (ref 98–109)
CO2: 18 mEq/L — ABNORMAL LOW (ref 22–29)
CREATININE: 2.7 mg/dL — AB (ref 0.7–1.3)
Glucose: 108 mg/dl (ref 70–140)
POTASSIUM: 3.8 meq/L (ref 3.5–5.1)
Sodium: 144 mEq/L (ref 136–145)
Total Bilirubin: 0.76 mg/dL (ref 0.20–1.20)
Total Protein: 7.2 g/dL (ref 6.4–8.3)

## 2013-05-16 LAB — CBC WITH DIFFERENTIAL/PLATELET
BASO%: 0.4 % (ref 0.0–2.0)
Basophils Absolute: 0 10*3/uL (ref 0.0–0.1)
EOS ABS: 0.1 10*3/uL (ref 0.0–0.5)
EOS%: 1.5 % (ref 0.0–7.0)
HEMATOCRIT: 39 % (ref 38.4–49.9)
HGB: 12.4 g/dL — ABNORMAL LOW (ref 13.0–17.1)
LYMPH%: 12.5 % — ABNORMAL LOW (ref 14.0–49.0)
MCH: 27.6 pg (ref 27.2–33.4)
MCHC: 31.8 g/dL — ABNORMAL LOW (ref 32.0–36.0)
MCV: 86.7 fL (ref 79.3–98.0)
MONO#: 0.4 10*3/uL (ref 0.1–0.9)
MONO%: 7.2 % (ref 0.0–14.0)
NEUT%: 78.4 % — AB (ref 39.0–75.0)
NEUTROS ABS: 4.2 10*3/uL (ref 1.5–6.5)
NRBC: 0 % (ref 0–0)
PLATELETS: 112 10*3/uL — AB (ref 140–400)
RBC: 4.5 10*6/uL (ref 4.20–5.82)
RDW: 17 % — ABNORMAL HIGH (ref 11.0–14.6)
WBC: 5.4 10*3/uL (ref 4.0–10.3)
lymph#: 0.7 10*3/uL — ABNORMAL LOW (ref 0.9–3.3)

## 2013-05-16 LAB — LACTATE DEHYDROGENASE (CC13): LDH: 328 U/L — ABNORMAL HIGH (ref 125–245)

## 2013-05-16 NOTE — Progress Notes (Signed)
[facility] [phone]   [phone]  OFFICE PROGRESS NOTE  Renato Shin, MD [address]  [place] [address]  DIAGNOSIS: questionable metastatic renal cell carcinoma  PRIOR THERAPY: None  CURRENT THERAPY: None  INTERVAL HISTORY: [name] 80 y.o. male returns to the clinic today for follow up visit accompanied by his wife. The patient is feeling fine today with no specific complaints except for mild fatigue and shortness of breath.  He denied having any significant fever or chills. He denied having any significant weight loss or night sweats. He has no nausea or vomiting. He underwent left sided thoracentesis on 05/07/2013 and the final cytology showed atypical cells. The patient is here today for evaluation and discussion of his condition and treatment.  MEDICAL HISTORY: Past Medical History  Diagnosis Date  . Hypertension   . Anemia   . PAD (peripheral artery disease) 10/10    Angiogram  Dr Trula Slade , severe stenosis left profunda after insertion of aorto-bifemoral  bypass graft-total occulsionof left SFA. Collateral flow through the profunda femoral artery.  Marland Kitchen CAD in native artery 2003    by cath   . Hypercholesteremia   . Thyroid disease   . Thyroiditis, unspecified   . Osteoarthritis of spine   . Olecranon bursitis   . 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 GERD (gastroesophageal reflux disease)   . Diabetes mellitus   . History of colonic polyps   . Iron deficiency anemia, unspecified   . Leg pain   . Numbness   . History of colonoscopy   . Chronic kidney disease   . History of blood transfusion   . Stroke     ALLERGIES:  is allergic to clopidogrel bisulfate.  MEDICATIONS:  Current Outpatient Prescriptions  Medication Sig Dispense Refill  . atenolol (TENORMIN) 25 MG tablet Take 1 tablet (25 mg total) by mouth every morning.  30 tablet  3  . atorvastatin (LIPITOR) 40 MG  tablet Take 1 tablet (40 mg total) by mouth daily.  90 tablet  3  . calcitRIOL (ROCALTROL) 0.5 MCG capsule Take 0.5 mcg by mouth daily.       . furosemide (LASIX) 80 MG tablet       . glucose blood (ONE TOUCH ULTRA TEST) test strip 1 each by Other route 3 (three) times daily. And lancets 3/day 250.01  270 each  12  . hydrochlorothiazide (HYDRODIURIL) 25 MG tablet Take 1 tablet (25 mg total) by mouth every morning.  90 tablet  0  . HYDROcodone-acetaminophen (NORCO) 10-325 MG per tablet Take 1 tablet by mouth every 4 (four) hours as needed.  100 tablet  0  . Insulin Lispro Prot & Lispro (HUMALOG MIX 75/25 KWIKPEN) (75-25) 100 UNIT/ML SUPN Inject under the skin twice daily 13 units with breakfast and 9 units with evening meal  40 mL  1  . Insulin Pen Needle 31G X 8 MM MISC 1 Device by Does not apply route 2 (two) times daily.  180 each  3  . dipyridamole-aspirin (AGGRENOX) 200-25 MG per 12 hr capsule Take 1 capsule by mouth 2 (two) times daily.  180 capsule  3  . sildenafil (VIAGRA) 100 MG tablet Take 1 tablet (100 mg total) by mouth daily as needed. For sexual activity  5 tablet  11   No current facility-administered medications for this visit.    SURGICAL HISTORY:  Past Surgical History  Procedure Laterality  Date  . Appendectomy    . Aortobifemoral bypass  2002  . Electrocardiogram  01/12/2006  . Stress cardiolite  04/14/2003  . Pr vein bypass graft,aorto-fem-pop  2002  . Renal artery stent  11/16/2010    Right renal artery  . Eye surgery      Cataract  . Embolectomy  03/08/2012    Procedure: EMBOLECTOMY;  Surgeon: Mal Misty, MD;  Location: [facility] OR;  Service: Vascular;  Laterality: Left;  THROMBECTOMY OF LEFT LIMB OF AORTO-FEMORAL BYPASS GRAFT  . Patch angioplasty  03/08/2012    Procedure: PATCH ANGIOPLASTY;  Surgeon: Mal Misty, MD;  Location: [facility];  Service: Vascular;  Laterality: Left;  Left fropunda endarterectomy with patch angioplasty    REVIEW OF SYSTEMS:  Constitutional:  positive for fatigue Eyes: negative Ears, nose, mouth, throat, and face: negative Respiratory: positive for dyspnea on exertion Cardiovascular: negative Gastrointestinal: negative Genitourinary:negative Integument/breast: negative Hematologic/lymphatic: negative Musculoskeletal:positive for muscle weakness Neurological: negative Behavioral/Psych: negative Endocrine: negative Allergic/Immunologic: negative   PHYSICAL EXAMINATION: General appearance: alert, cooperative and fatigued Head: Normocephalic, without obvious abnormality, atraumatic Neck: no adenopathy, no JVD, supple, symmetrical, trachea midline and thyroid not enlarged, symmetric, no tenderness/mass/nodules Lymph nodes: Cervical, supraclavicular, and axillary nodes normal. Resp: diminished breath sounds LLL and dullness to percussion LLL Back: symmetric, no curvature. ROM normal. No CVA tenderness. Cardio: regular rate and rhythm, S1, S2 normal, no murmur, click, rub or gallop GI: soft, non-tender; bowel sounds normal; no masses,  no organomegaly Extremities: extremities normal, atraumatic, no cyanosis or edema Neurologic: Alert and oriented X 3, normal strength and tone. Normal symmetric reflexes. Normal coordination and gait  ECOG PERFORMANCE STATUS: 2 - Symptomatic, <50% confined to bed  Blood pressure 124/78, pulse 92, temperature 97.9 F (36.6 C), temperature source Oral, resp. rate 18, height 6\' 2"  (1.88 m), weight 152 lb 12.8 oz (69.31 kg).  LABORATORY DATA: Lab Results  Component Value Date   WBC 5.4 05/16/2013   HGB 12.4* 05/16/2013   HCT 39.0 05/16/2013   MCV 86.7 05/16/2013   PLT 112* 05/16/2013      Chemistry      Component Value Date/Time   NA 144 05/16/2013 1137   NA 139 04/10/2013 1049   K 3.8 05/16/2013 1137   K 4.2 04/10/2013 1049   CL 111 04/10/2013 1049   CO2 18* 05/16/2013 1137   CO2 18* 04/10/2013 1049   BUN 61.8* 05/16/2013 1137   BUN 47* 04/10/2013 1049   CREATININE 2.7* 05/16/2013 1137    CREATININE 2.6* 04/10/2013 1049   CREATININE 1.84* 09/10/2012 0948      Component Value Date/Time   CALCIUM 9.0 05/16/2013 1137   CALCIUM 8.7 04/10/2013 1049   CALCIUM 8.9 08/23/2012 0447   ALKPHOS 98 05/16/2013 1137   ALKPHOS 109 04/10/2013 1049   AST 28 05/16/2013 1137   AST 35 04/10/2013 1049   ALT 20 05/16/2013 1137   ALT 21 04/10/2013 1049   BILITOT 0.76 05/16/2013 1137   BILITOT 0.9 04/10/2013 1049       RADIOGRAPHIC STUDIES: Ct Abdomen Pelvis Wo Contrast  04/12/2013   CLINICAL DATA:  Lung nodules on chest radiograph. History of smoking and COPD as well as renal failure  EXAM: CT CHEST, ABDOMEN AND PELVIS WITHOUT CONTRAST  TECHNIQUE: Multidetector CT imaging of the chest, abdomen and pelvis was performed following the standard protocol without IV contrast.  COMPARISON:  CT 06/02/2011, MRI 04/05/2012, 10/09/2012 is  FINDINGS: CT CHEST FINDINGS  There are bilateral round pulmonary  nodules of varying. There is a left upper lobe mass. The mass measures 3.5 cm in the medial left upper lobe (image 17). Example nodule in the right lower lobe measures 16 mm (image 46, series 3). Index nodule in the left lung measures 10 mm (image 21). There approximately 12 nodules combined in the left and right lungs.  No axillary or supraclavicular lymphadenopathy evident on this noncontrast exam. No clear mediastinal adenopathy. No clear hilar adenopathy. No pericardial fluid. Esophagus is grossly normal.  There bilateral small pleural effusions greater on the left.  CT ABDOMEN AND PELVIS FINDINGS  No focal hepatic lesion on this noncontrast exam. The gallbladder is distended. There is extensive calcification of the pancreas with duct dilatation suggesting chronic pancreatitis. Spleen is small. Adrenal glands are grossly normal with difficult a evaluate. There is a mass in the upper pole of the left kidney measuring 4 cm (image 19, series 2). This compares to the 4 cm cryoablation site seen on comparison MRI  10/09/2012. This has soft tissue attenuation and central calcifications.  This stomach were, small bowel a and colon are unremarkable. Abdominal aorta is heavily calcified. There is a repair of abdominal aorta noted.  Bladder and prostate gland appear normal. No pelvic lymphadenopathy. There is anasarca the soft tissues of the thorax and abdomen. View of the bone windows demonstrates no aggressive osseous lesion.  IMPRESSION: 1. Bilateral pulmonary nodule and left upper lobe pulmonary mass consistent with pulmonary metastasis. 2. Soft tissue density measuring 4 cm upper pole of the left kidney corresponds to the cryoablation site seen on comparison exams. Recommend re-evaluation with MRI with and without contrast if renal function allows. 3. Bilateral pleural effusions and anasarca of the soft tissues. 4. Chronic pancreatitis. These results will be called to the ordering clinician or representative by the Radiologist Assistant, and communication documented in the PACS Dashboard.   Electronically Signed   By: Suzy Bouchard M.D.   On: 04/12/2013 10:27   Dg Chest 1 View  05/07/2013   CLINICAL DATA:  Post left-sided thoracentesis.  EXAM: CHEST - 1 VIEW  COMPARISON:  03/09/2012 chest x-ray.  05/03/2013 PET-CT.  FINDINGS: When compared to the PET CT, decrease in size of left-sided pleural effusion. Left pleural fluid remains with left base atelectasis. No gross pneumothorax.  Mass left upper lobe and scattered smaller pulmonary nodules once again noted, better defined on PET-CT.  Pulmonary vascular prominence.  Cardiomegaly.  Prominent vascular calcifications.  IMPRESSION: When compared to the PET CT, decrease in size of left-sided pleural effusion. Left pleural fluid remains with left base atelectasis. No gross pneumothorax.  Mass left upper lobe and scattered smaller pulmonary nodules once again noted, better defined on PET-CT.  Pulmonary vascular prominence.  Cardiomegaly.   Electronically Signed   By: Chauncey Cruel M.D.   On: 05/07/2013 12:40   Dg Chest 2 View  04/10/2013   CLINICAL DATA:  Weight loss.  Smoker.  Renal cell carcinoma  EXAM: CHEST  2 VIEW  COMPARISON:  02/28/2012  FINDINGS: COPD with pulmonary hyperinflation. Cardiac enlargement. Negative for edema. Small pleural effusions are present. Mild bibasilar airspace disease.  2 cm left upper lobe mass lesion. Question additional multiple pulmonary nodules.  IMPRESSION: COPD.  Cardiac enlargement with bilateral small pleural effusions. Negative for edema.  Bilateral pulmonary nodules are present. This pattern is suspicious for metastatic disease. CT chest with contrast suggested for further evaluation.   Electronically Signed   By: Franchot Gallo M.D.   On: 04/10/2013  13:09   Ct Chest Wo Contrast  04/12/2013   CLINICAL DATA:  Lung nodules on chest radiograph. History of smoking and COPD as well as renal failure  EXAM: CT CHEST, ABDOMEN AND PELVIS WITHOUT CONTRAST  TECHNIQUE: Multidetector CT imaging of the chest, abdomen and pelvis was performed following the standard protocol without IV contrast.  COMPARISON:  CT 06/02/2011, MRI 04/05/2012, 10/09/2012 is  FINDINGS: CT CHEST FINDINGS  There are bilateral round pulmonary nodules of varying. There is a left upper lobe mass. The mass measures 3.5 cm in the medial left upper lobe (image 17). Example nodule in the right lower lobe measures 16 mm (image 46, series 3). Index nodule in the left lung measures 10 mm (image 21). There approximately 12 nodules combined in the left and right lungs.  No axillary or supraclavicular lymphadenopathy evident on this noncontrast exam. No clear mediastinal adenopathy. No clear hilar adenopathy. No pericardial fluid. Esophagus is grossly normal.  There bilateral small pleural effusions greater on the left.  CT ABDOMEN AND PELVIS FINDINGS  No focal hepatic lesion on this noncontrast exam. The gallbladder is distended. There is extensive calcification of the pancreas with duct  dilatation suggesting chronic pancreatitis. Spleen is small. Adrenal glands are grossly normal with difficult a evaluate. There is a mass in the upper pole of the left kidney measuring 4 cm (image 19, series 2). This compares to the 4 cm cryoablation site seen on comparison MRI 10/09/2012. This has soft tissue attenuation and central calcifications.  This stomach were, small bowel a and colon are unremarkable. Abdominal aorta is heavily calcified. There is a repair of abdominal aorta noted.  Bladder and prostate gland appear normal. No pelvic lymphadenopathy. There is anasarca the soft tissues of the thorax and abdomen. View of the bone windows demonstrates no aggressive osseous lesion.  IMPRESSION: 1. Bilateral pulmonary nodule and left upper lobe pulmonary mass consistent with pulmonary metastasis. 2. Soft tissue density measuring 4 cm upper pole of the left kidney corresponds to the cryoablation site seen on comparison exams. Recommend re-evaluation with MRI with and without contrast if renal function allows. 3. Bilateral pleural effusions and anasarca of the soft tissues. 4. Chronic pancreatitis. These results will be called to the ordering clinician or representative by the Radiologist Assistant, and communication documented in the PACS Dashboard.   Electronically Signed   By: Suzy Bouchard M.D.   On: 04/12/2013 10:27   Mr Brain Wo Contrast  05/03/2013   CLINICAL DATA:  Pulmonary nodules. Rule out metastatic disease. Renal failure.  EXAM: MRI HEAD WITHOUT CONTRAST  TECHNIQUE: Multiplanar, multiecho pulse sequences of the brain and surrounding structures were obtained without intravenous contrast.  COMPARISON:  MRI 07/06/2006  FINDINGS: Intravenous contrast was not administered due to renal failure.  Moderate atrophy. Mild chronic microvascular ischemic changes in the white matter. Chronic lacune right thalamus. Negative for acute infarct.  Negative for mass or edema.  No intracranial hemorrhage.  CSF fluid  collection lateral to the left cerebellum was present in 2008 and appears benign  IMPRESSION: Atrophy and chronic microvascular ischemic change. No acute infarct or mass.  Negative for metastatic disease on unenhanced images.   Electronically Signed   By: Franchot Gallo M.D.   On: 05/03/2013 10:38   Nm Pet Image Initial (pi) Skull Base To Thigh  05/03/2013   CLINICAL DATA:  Initial treatment strategy for lung lesions. History of renal cell cancer.  EXAM: NUCLEAR MEDICINE PET SKULL BASE TO THIGH  FASTING BLOOD GLUCOSE:  Value: 100mg /dl  TECHNIQUE: 19.8 mCi F-18 FDG was injected intravenously. CT data was obtained and used for attenuation correction and anatomic localization only. (This was not acquired as a diagnostic CT examination.) Additional exam technical data entered on technologist worksheet.  COMPARISON:  CT scan 04/12/2013  FINDINGS: NECK  No hypermetabolic lymph nodes in the neck.  CHEST  Numerous bilateral pulmonary nodules demonstrating neoplastic range FDG uptake with SUV max ranging between 2.0 and 6.0. Findings consistent with pulmonary metastatic disease. There is also a large left pleural effusion, increased since prior CT scan. There are pleural masses also demonstrating FDG uptake of between 3.0 and 5.6.  ABDOMEN/PELVIS  The left renal mass has been previously treated. There is FDG uptake with SUV max is 6.5. Findings suspicious for residual tumor but post ablation changes also possible.  SKELETON  No focal hypermetabolic activity to suggest skeletal metastasis.  IMPRESSION: 1. Diffuse pulmonary metastatic disease. 2. Enlarging left-sided pleural effusion with FDG positive pleural nodules consistent with pleural metastasis. 3. Neoplastic range uptake in the left upper lobe renal mass which has been previously treated. Findings could be due to post ablation changes or persistent tumor. 4. No findings for osseous metastatic disease.   Electronically Signed   By: Kalman Jewels M.D.   On:  05/03/2013 12:11   US Thoracentesis Asp Pleural Space W/img Guide  05/07/2013   CLINICAL DATA:  Left pleural effusion  EXAM: ULTRASOUND GUIDED left THORACENTESIS  COMPARISON:  None.  FINDINGS: A total of approximately 1.1 liters of clear serous fluid was removed. A fluid sample wassent for laboratory analysis.  IMPRESSION: Successful ultrasound guided left thoracentesis yielding 1.1 liters of pleural fluid.  Read By:  Tsosie Billing PA-C  PROCEDURE: An ultrasound guided thoracentesis was thoroughly discussed with the patient and questions answered. The benefits, risks, alternatives and complications were also discussed. The patient understands and wishes to proceed with the procedure. Written consent was obtained.  Ultrasound was performed to localize and mark an adequate pocket of fluid in the left chest. The area was then prepped and draped in the normal sterile fashion. 1% Lidocaine was used for local anesthesia. Under ultrasound guidance a 19 gauge Yueh catheter was introduced. Thoracentesis was performed. The catheter was removed and a dressing applied.  Complications:  none   Electronically Signed   By: Arne Cleveland M.D.   On: 05/07/2013 12:59    ASSESSMENT AND PLAN: This is a very pleasant 80 years old [place] American male with questionable metastatic renal cell carcinoma with bilateral pulmonary nodules in addition to large left pleural effusion status post ultrasound-guided left thoracentesis on 05/07/2013 with drainage of 1.1 L of pleural fluid. The final cytology showed atypical cells but no confirmation of malignancy. I recommended for the patient to consider CT-guided biopsy of the left upper lobe lung mass.  I recommended for the patient to come back for follow up visit in 2 weeks for evaluation once the final cytology is available for discussion of his treatment options. The patient and his wife agree to the current plan. The patient voices understanding of current disease status and  treatment options and is in agreement with the current care plan.  All questions were answered. The patient knows to call the clinic with any problems, questions or concerns. We can certainly see the patient much sooner if necessary.  Disclaimer: This note was dictated with voice recognition software. Similar sounding words can inadvertently be transcribed and may not be corrected upon review.  Disclaimer:  This note was dictated with voice recognition software. Similar sounding words can inadvertently be transcribed and may not be corrected upon review.

## 2013-05-17 ENCOUNTER — Encounter (HOSPITAL_COMMUNITY): Payer: Self-pay | Admitting: Pharmacy Technician

## 2013-05-17 NOTE — Patient Instructions (Signed)
followup visit in 2 weeks for evaluation and discussion of her treatment options.

## 2013-05-20 ENCOUNTER — Other Ambulatory Visit: Payer: Self-pay | Admitting: Radiology

## 2013-05-21 ENCOUNTER — Other Ambulatory Visit (HOSPITAL_COMMUNITY): Payer: Self-pay | Admitting: Radiology

## 2013-05-22 ENCOUNTER — Ambulatory Visit (HOSPITAL_COMMUNITY)
Admission: RE | Admit: 2013-05-22 | Discharge: 2013-05-22 | Disposition: A | Discharge status: Home / Self Care | Payer: Medicare Other | Source: Ambulatory Visit | Attending: Interventional Radiology | Admitting: Interventional Radiology

## 2013-05-22 ENCOUNTER — Ambulatory Visit (HOSPITAL_COMMUNITY)
Admission: RE | Admit: 2013-05-22 | Discharge: 2013-05-22 | Disposition: A | Discharge status: Home / Self Care | Payer: Medicare Other | Source: Ambulatory Visit | Attending: Internal Medicine | Admitting: Internal Medicine

## 2013-05-22 ENCOUNTER — Encounter (HOSPITAL_COMMUNITY): Payer: Self-pay

## 2013-05-22 DIAGNOSIS — I739 Peripheral vascular disease, unspecified: Secondary | ICD-10-CM | POA: Insufficient documentation

## 2013-05-22 DIAGNOSIS — F17200 Nicotine dependence, unspecified, uncomplicated: Secondary | ICD-10-CM | POA: Insufficient documentation

## 2013-05-22 DIAGNOSIS — E7800 Pure hypercholesterolemia, unspecified: Secondary | ICD-10-CM | POA: Insufficient documentation

## 2013-05-22 DIAGNOSIS — K219 Gastro-esophageal reflux disease without esophagitis: Secondary | ICD-10-CM | POA: Insufficient documentation

## 2013-05-22 DIAGNOSIS — C649 Malignant neoplasm of unspecified kidney, except renal pelvis: Secondary | ICD-10-CM | POA: Insufficient documentation

## 2013-05-22 DIAGNOSIS — Z9089 Acquired absence of other organs: Secondary | ICD-10-CM | POA: Insufficient documentation

## 2013-05-22 DIAGNOSIS — D509 Iron deficiency anemia, unspecified: Secondary | ICD-10-CM | POA: Insufficient documentation

## 2013-05-22 DIAGNOSIS — E109 Type 1 diabetes mellitus without complications: Secondary | ICD-10-CM | POA: Insufficient documentation

## 2013-05-22 DIAGNOSIS — E119 Type 2 diabetes mellitus without complications: Secondary | ICD-10-CM | POA: Insufficient documentation

## 2013-05-22 DIAGNOSIS — Z8673 Personal history of transient ischemic attack (TIA), and cerebral infarction without residual deficits: Secondary | ICD-10-CM | POA: Insufficient documentation

## 2013-05-22 DIAGNOSIS — I2510 Atherosclerotic heart disease of native coronary artery without angina pectoris: Secondary | ICD-10-CM | POA: Insufficient documentation

## 2013-05-22 DIAGNOSIS — C7800 Secondary malignant neoplasm of unspecified lung: Secondary | ICD-10-CM | POA: Insufficient documentation

## 2013-05-22 DIAGNOSIS — I129 Hypertensive chronic kidney disease with stage 1 through stage 4 chronic kidney disease, or unspecified chronic kidney disease: Secondary | ICD-10-CM | POA: Insufficient documentation

## 2013-05-22 DIAGNOSIS — N189 Chronic kidney disease, unspecified: Secondary | ICD-10-CM | POA: Insufficient documentation

## 2013-05-22 LAB — APTT: aPTT: 37 seconds (ref 24–37)

## 2013-05-22 LAB — CBC
HCT: 40.2 % (ref 39.0–52.0)
Hemoglobin: 13.2 g/dL (ref 13.0–17.0)
MCH: 28.3 pg (ref 26.0–34.0)
MCHC: 32.8 g/dL (ref 30.0–36.0)
MCV: 86.3 fL (ref 78.0–100.0)
PLATELETS: 116 10*3/uL — AB (ref 150–400)
RBC: 4.66 MIL/uL (ref 4.22–5.81)
RDW: 16.7 % — ABNORMAL HIGH (ref 11.5–15.5)
WBC: 5.2 10*3/uL (ref 4.0–10.5)

## 2013-05-22 LAB — PROTIME-INR
INR: 1.34 (ref 0.00–1.49)
PROTHROMBIN TIME: 16.3 s — AB (ref 11.6–15.2)

## 2013-05-22 LAB — GLUCOSE, CAPILLARY: Glucose-Capillary: 114 mg/dL — ABNORMAL HIGH (ref 70–99)

## 2013-05-22 MED ORDER — MIDAZOLAM HCL 2 MG/2ML IJ SOLN
INTRAMUSCULAR | Status: AC
  Filled 2013-05-22: qty 4

## 2013-05-22 MED ORDER — LIDOCAINE-EPINEPHRINE 1 %-1:100000 IJ SOLN
INTRAMUSCULAR | Status: AC
  Filled 2013-05-22: qty 1

## 2013-05-22 MED ORDER — MIDAZOLAM HCL 2 MG/2ML IJ SOLN
INTRAMUSCULAR | Status: AC | PRN
  Administered 2013-05-22: 0.5 mg via INTRAVENOUS

## 2013-05-22 MED ORDER — SODIUM CHLORIDE 0.9 % IV SOLN
Freq: Once | INTRAVENOUS | Status: AC
  Administered 2013-05-22: 08:00:00 via INTRAVENOUS

## 2013-05-22 MED ORDER — FENTANYL CITRATE 0.05 MG/ML IJ SOLN
INTRAMUSCULAR | Status: AC | PRN
  Administered 2013-05-22: 25 ug via INTRAVENOUS

## 2013-05-22 MED ORDER — FENTANYL CITRATE 0.05 MG/ML IJ SOLN
INTRAMUSCULAR | Status: AC
  Filled 2013-05-22: qty 4

## 2013-05-22 NOTE — H&P (Signed)
[name] is an 80 y.o. male.   Chief Complaint: Pt with known hx renal cell cancer L renal tumor cryoablation 02/2012 Follows with Dr Ellery Plunk 03/2013 revealed new B pulmonary nodules CT confirms findings; 05/07/13 L thoracentesis 1.1L: neg + PET 05/06/13; now scheduled for L upper lobe mass biopsy  HPI: HTN; PAD; smoker; CAD; HLD; DM; CVA; renal cell ca  Past Medical History  Diagnosis Date  . Hypertension   . Anemia   . PAD (peripheral artery disease) 10/10    Angiogram  Dr Trula Slade , severe stenosis left profunda after insertion of aorto-bifemoral  bypass graft-total occulsionof left SFA. Collateral flow through the profunda femoral artery.  Marland Kitchen CAD in native artery 2003    by cath   . Hypercholesteremia   . Thyroid disease   . Thyroiditis, unspecified   . Osteoarthritis of spine   . Olecranon bursitis   . 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 GERD (gastroesophageal reflux disease)   . Diabetes mellitus   . History of colonic polyps   . Iron deficiency anemia, unspecified   . Leg pain   . Numbness   . History of colonoscopy   . Chronic kidney disease   . History of blood transfusion   . Stroke     Past Surgical History  Procedure Laterality Date  . Appendectomy    . Aortobifemoral bypass  2002  . Electrocardiogram  01/12/2006  . Stress cardiolite  04/14/2003  . Pr vein bypass graft,aorto-fem-pop  2002  . Renal artery stent  11/16/2010    Right renal artery  . Eye surgery      Cataract  . Embolectomy  03/08/2012    Procedure: EMBOLECTOMY;  Surgeon: Mal Misty, MD;  Location: [facility] OR;  Service: Vascular;  Laterality: Left;  THROMBECTOMY OF LEFT LIMB OF AORTO-FEMORAL BYPASS GRAFT  . Patch angioplasty  03/08/2012    Procedure: PATCH ANGIOPLASTY;  Surgeon: Mal Misty, MD;  Location: [facility] OR;  Service: Vascular;  Laterality: Left;  Left fropunda endarterectomy with patch angioplasty    Family History  Problem Relation Age of Onset  .  Kidney disease Sister   . Heart attack Father   . Coronary artery disease Father   . Cancer Mother    Social History:  reports that he has been smoking Cigarettes.  He has a 45 pack-year smoking history. He has never used smokeless tobacco. He reports that he does not drink alcohol or use illicit drugs.  Allergies:  Allergies  Allergen Reactions  . Clopidogrel Bisulfate Hives     (Not in a hospital admission)  Results for orders placed during the hospital encounter of 05/22/13 (from the past 48 hour(s))  GLUCOSE, CAPILLARY     Status: Abnormal   Collection Time    05/22/13  7:32 AM      Result Value Range   Glucose-Capillary 114 (*) 70 - 99 mg/dL   Comment 1 Notify RN     Comment 2 Documented in Chart    APTT     Status: None   Collection Time    05/22/13  7:34 AM      Result Value Range   aPTT 37  24 - 37 seconds   Comment:            IF BASELINE aPTT IS ELEVATED,     SUGGEST PATIENT RISK ASSESSMENT     BE USED TO DETERMINE APPROPRIATE     ANTICOAGULANT THERAPY.  CBC  Status: Abnormal (Preliminary result)   Collection Time    05/22/13  7:34 AM      Result Value Range   WBC 5.2  4.0 - 10.5 K/uL   RBC 4.66  4.22 - 5.81 MIL/uL   Hemoglobin 13.2  13.0 - 17.0 g/dL   HCT 40.2  39.0 - 52.0 %   MCV 86.3  78.0 - 100.0 fL   MCH 28.3  26.0 - 34.0 pg   MCHC 32.8  30.0 - 36.0 g/dL   RDW 16.7 (*) 11.5 - 15.5 %   Platelets PENDING  150 - 400 K/uL  PROTIME-INR     Status: Abnormal   Collection Time    05/22/13  7:34 AM      Result Value Range   Prothrombin Time 16.3 (*) 11.6 - 15.2 seconds   INR 1.34  0.00 - 1.49   No results found.  Review of Systems  Constitutional: Positive for weight loss. Negative for fever.  Respiratory: Positive for cough and sputum production.   Gastrointestinal: Negative for nausea, vomiting and abdominal pain.  Musculoskeletal: Positive for back pain and joint pain.       [place] [address] this am going to bathroom; Rt hip pain comes and goes   Neurological: Positive for weakness. Negative for dizziness and headaches.  Psychiatric/Behavioral: Positive for substance abuse.       Smoker    Blood pressure 142/77, pulse 70, temperature 97.8 F (36.6 C), temperature source Oral, resp. rate 18, height 6\' 2"  (1.88 m), weight 152 lb (68.947 kg), SpO2 100.00%. Physical Exam  Constitutional: He is oriented to person, place, and time. He appears well-developed.  Thin, frail  Cardiovascular: Normal rate, regular rhythm and normal heart sounds.   No murmur heard. Respiratory: Effort normal. He has wheezes.  GI: Soft. Bowel sounds are normal. There is no tenderness.  Musculoskeletal: Normal range of motion.  Neurological: He is alert and oriented to person, place, and time.  Skin: Skin is warm and dry.  Psychiatric: He has a normal mood and affect. His behavior is normal. Judgment and thought content normal.     Assessment/Plan Left lung mass +PET 05/06/2013 thora neg Hx renal cell ca; post cryoablation 02/2012 Pt scheduled for LUL mass bx Pt and son aware of procedure benefits and risks and agreeable to proceed Consent signed and in chart   Valeri Sula A 05/22/2013, 8:13 AM

## 2013-05-22 NOTE — Progress Notes (Signed)
Discharge instruction given per MD order.  Son was able to verbalize understanding on instructions.  Pt to car via wheelchair.

## 2013-05-22 NOTE — Discharge Instructions (Signed)
Needle Biopsy of Lung, Care After °Refer to this sheet in the next few weeks. These instructions provide you with information on caring for yourself after your procedure. Your health care provider may also give you more specific instructions. Your treatment has been planned according to current medical practices, but problems sometimes occur. Call your health care provider if you have any problems or questions after your procedure. °WHAT TO EXPECT AFTER THE PROCEDURE °A bandage will be applied over the areas where the needle was inserted. You may be asked to apply pressure to the bandage for several minutes to ensure there is minimal bleeding. In most cases, you can leave when your needle biopsy procedure is completed. Do not drive yourself home. Someone else should take you home. If you received an IV sedative or general anesthetic, you will be taken to a comfortable place to relax while the medication wears off. If you have upcoming travel scheduled, talk to your doctor about when it is safe to travel by air after the procedure. °HOME CARE INSTRUCTIONS °Expect to take it easy for the rest of the day. Protect the area where you received the needle biopsy by keeping the bandage in place for as long as instructed. You may feel some mild pain or discomfort in the area, but this should stop in a day or two. Only take over-the-counter or prescription medicines for pain, discomfort, or fever as directed by your caregiver. °SEEK MEDICAL CARE IF:  °· You have pain at the biopsy site that worsens or is not helped by medication. °· You have swelling or drainage at the needle biopsy site. °· You have a fever. °SEEK IMMEDIATE MEDICAL CARE IF:  °· You have new or worsening shortness of breath. °· You have chest pain. °· You are coughing up blood. °· You have bleeding that does not stop with pressure or a bandage. °· You develop light-headedness or fainting. °Document Released: 02/06/2007 Document Revised: 12/12/2012 Document  Reviewed: 09/03/2012 °Exit[employer]® Patient Information ©2014 [employer]. ° °

## 2013-05-22 NOTE — Progress Notes (Signed)
Received form  radiology alert and denies any discomfort at this time. Family at bedside

## 2013-05-22 NOTE — Procedures (Signed)
Technically successful CT guided biopsy of hypermetabolic left upper lobe pulmonary mass.  No immediate post procedural complications.  Note was made of a recurrent large left sided pleural effusion prior to the initiation of the procedure.

## 2013-05-27 ENCOUNTER — Encounter: Payer: Self-pay | Admitting: *Deleted

## 2013-05-27 ENCOUNTER — Other Ambulatory Visit: Payer: Self-pay | Admitting: *Deleted

## 2013-05-27 DIAGNOSIS — N2889 Other specified disorders of kidney and ureter: Secondary | ICD-10-CM

## 2013-05-28 ENCOUNTER — Telehealth: Payer: Self-pay | Admitting: Internal Medicine

## 2013-05-28 NOTE — Telephone Encounter (Signed)
s.w pt wife and advised on Feb appt....ok and aware °

## 2013-06-06 ENCOUNTER — Ambulatory Visit (HOSPITAL_BASED_OUTPATIENT_CLINIC_OR_DEPARTMENT_OTHER): Payer: Medicare Other | Admitting: Internal Medicine

## 2013-06-06 ENCOUNTER — Other Ambulatory Visit (HOSPITAL_BASED_OUTPATIENT_CLINIC_OR_DEPARTMENT_OTHER): Payer: Medicare Other

## 2013-06-06 ENCOUNTER — Encounter: Payer: Self-pay | Admitting: Internal Medicine

## 2013-06-06 VITALS — BP 63/48 | HR 68 | Temp 97.3°F | Resp 17 | Ht 74.0 in | Wt 151.4 lb

## 2013-06-06 DIAGNOSIS — R5381 Other malaise: Secondary | ICD-10-CM

## 2013-06-06 DIAGNOSIS — E039 Hypothyroidism, unspecified: Secondary | ICD-10-CM

## 2013-06-06 DIAGNOSIS — C7800 Secondary malignant neoplasm of unspecified lung: Secondary | ICD-10-CM

## 2013-06-06 DIAGNOSIS — R5383 Other fatigue: Secondary | ICD-10-CM

## 2013-06-06 DIAGNOSIS — C649 Malignant neoplasm of unspecified kidney, except renal pelvis: Secondary | ICD-10-CM

## 2013-06-06 DIAGNOSIS — I2510 Atherosclerotic heart disease of native coronary artery without angina pectoris: Secondary | ICD-10-CM

## 2013-06-06 DIAGNOSIS — N2889 Other specified disorders of kidney and ureter: Secondary | ICD-10-CM

## 2013-06-06 DIAGNOSIS — E119 Type 2 diabetes mellitus without complications: Secondary | ICD-10-CM

## 2013-06-06 LAB — BASIC METABOLIC PANEL (CC13)
Anion Gap: 17 mEq/L — ABNORMAL HIGH (ref 3–11)
BUN: 89.8 mg/dL — AB (ref 7.0–26.0)
CHLORIDE: 112 meq/L — AB (ref 98–109)
CO2: 15 mEq/L — ABNORMAL LOW (ref 22–29)
CREATININE: 4 mg/dL — AB (ref 0.7–1.3)
Calcium: 8.6 mg/dL (ref 8.4–10.4)
GLUCOSE: 144 mg/dL — AB (ref 70–140)
POTASSIUM: 4.1 meq/L (ref 3.5–5.1)
Sodium: 143 mEq/L (ref 136–145)

## 2013-06-06 LAB — CBC WITH DIFFERENTIAL/PLATELET
BASO%: 0.8 % (ref 0.0–2.0)
BASOS ABS: 0 10*3/uL (ref 0.0–0.1)
EOS ABS: 0.1 10*3/uL (ref 0.0–0.5)
EOS%: 1.1 % (ref 0.0–7.0)
HEMATOCRIT: 38.4 % (ref 38.4–49.9)
HEMOGLOBIN: 11.8 g/dL — AB (ref 13.0–17.1)
LYMPH#: 0.4 10*3/uL — AB (ref 0.9–3.3)
LYMPH%: 8 % — ABNORMAL LOW (ref 14.0–49.0)
MCH: 27.5 pg (ref 27.2–33.4)
MCHC: 30.8 g/dL — ABNORMAL LOW (ref 32.0–36.0)
MCV: 89.3 fL (ref 79.3–98.0)
MONO#: 0.4 10*3/uL (ref 0.1–0.9)
MONO%: 7.2 % (ref 0.0–14.0)
NEUT%: 82.9 % — AB (ref 39.0–75.0)
NEUTROS ABS: 4.2 10*3/uL (ref 1.5–6.5)
Platelets: 141 10*3/uL (ref 140–400)
RBC: 4.31 10*6/uL (ref 4.20–5.82)
RDW: 16.6 % — AB (ref 11.0–14.6)
WBC: 5.1 10*3/uL (ref 4.0–10.3)

## 2013-06-06 NOTE — Progress Notes (Signed)
[facility] [phone]   Fax:([phone]) [phone]  OFFICE PROGRESS NOTE  Renato Shin, MD [address] [place] [address]  DIAGNOSIS: questionable metastatic renal cell carcinoma  PRIOR THERAPY: None  CURRENT THERAPY: None  INTERVAL HISTORY: FINLEE MILO 80 y.o. male returns to the clinic today for follow up visit accompanied by his son and nephew. The patient is feeling fine today with no specific complaints except for mild fatigue and shortness of breath.  He denied having any significant fever or chills. He denied having any significant weight loss or night sweats. He has no nausea or vomiting. He recently underwent CT-guided core needle biopsy of the left upper lobe pulmonary mass by interventional radiology. The final pathology (Accession: [id number]) was consistent with metastatic renal cell carcinoma, clear cell type. The patient is here today for evaluation and discussion of his condition and treatment options.  MEDICAL HISTORY: Past Medical History  Diagnosis Date  . Hypertension   . Anemia   . PAD (peripheral artery disease) 10/10    Angiogram  Dr Trula Slade , severe stenosis left profunda after insertion of aorto-bifemoral  bypass graft-total occulsionof left SFA. Collateral flow through the profunda femoral artery.  Marland Kitchen CAD in native artery 2003    by cath   . Hypercholesteremia   . Thyroid disease   . Thyroiditis, unspecified   . Osteoarthritis of spine   . Olecranon bursitis   . 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 GERD (gastroesophageal reflux disease)   . Diabetes mellitus   . History of colonic polyps   . Iron deficiency anemia, unspecified   . Leg pain   . Numbness   . History of colonoscopy   . Chronic kidney disease   . History of blood transfusion   . Stroke     ALLERGIES:  is allergic to clopidogrel bisulfate.  MEDICATIONS:  Current Outpatient Prescriptions  Medication Sig  Dispense Refill  . atenolol (TENORMIN) 25 MG tablet Take 1 tablet (25 mg total) by mouth every morning.  30 tablet  3  . atorvastatin (LIPITOR) 40 MG tablet Take 1 tablet (40 mg total) by mouth daily.  90 tablet  3  . calcitRIOL (ROCALTROL) 0.5 MCG capsule Take 0.5 mcg by mouth daily.       Marland Kitchen dipyridamole-aspirin (AGGRENOX) 200-25 MG per 12 hr capsule Take 1 capsule by mouth 2 (two) times daily.  180 capsule  3  . furosemide (LASIX) 80 MG tablet Take 80 mg by mouth daily.       Marland Kitchen glucose blood (ONE TOUCH ULTRA TEST) test strip 1 each by Other route 3 (three) times daily. And lancets 3/day 250.01  270 each  12  . hydrochlorothiazide (HYDRODIURIL) 25 MG tablet Take 1 tablet (25 mg total) by mouth every morning.  90 tablet  0  . HYDROcodone-acetaminophen (NORCO) 10-325 MG per tablet Take 1 tablet by mouth every 4 (four) hours as needed for moderate pain.      . Insulin Lispro Prot & Lispro (HUMALOG 75/25 MIX) (75-25) 100 UNIT/ML Kwikpen Inject 11-16 Units into the skin 2 (two) times daily. Inject under the skin twice daily 16 units with breakfast and 11 units with evening meal      . Insulin Pen Needle 31G X 8 MM MISC 1 Device by Does not apply route 2 (two) times daily.  180 each  3  . sildenafil (VIAGRA) 100 MG tablet Take 1 tablet (  100 mg total) by mouth daily as needed. For sexual activity  5 tablet  11   No current facility-administered medications for this visit.    SURGICAL HISTORY:  Past Surgical History  Procedure Laterality Date  . Appendectomy    . Aortobifemoral bypass  2002  . Electrocardiogram  01/12/2006  . Stress cardiolite  04/14/2003  . Pr vein bypass graft,aorto-fem-pop  2002  . Renal artery stent  11/16/2010    Right renal artery  . Eye surgery      Cataract  . Embolectomy  03/08/2012    Procedure: EMBOLECTOMY;  Surgeon: Mal Misty, MD;  Location: [facility];  Service: Vascular;  Laterality: Left;  THROMBECTOMY OF LEFT LIMB OF AORTO-FEMORAL BYPASS GRAFT  . Patch  angioplasty  03/08/2012    Procedure: PATCH ANGIOPLASTY;  Surgeon: Mal Misty, MD;  Location: [facility];  Service: Vascular;  Laterality: Left;  Left fropunda endarterectomy with patch angioplasty    REVIEW OF SYSTEMS:  Constitutional: positive for fatigue Eyes: negative Ears, nose, mouth, throat, and face: negative Respiratory: positive for dyspnea on exertion Cardiovascular: negative Gastrointestinal: negative Genitourinary:negative Integument/breast: negative Hematologic/lymphatic: negative Musculoskeletal:positive for muscle weakness Neurological: negative Behavioral/Psych: negative Endocrine: negative Allergic/Immunologic: negative   PHYSICAL EXAMINATION: General appearance: alert, cooperative and fatigued Head: Normocephalic, without obvious abnormality, atraumatic Neck: no adenopathy, no JVD, supple, symmetrical, trachea midline and thyroid not enlarged, symmetric, no tenderness/mass/nodules Lymph nodes: Cervical, supraclavicular, and axillary nodes normal. Resp: diminished breath sounds LLL and dullness to percussion LLL Back: symmetric, no curvature. ROM normal. No CVA tenderness. Cardio: regular rate and rhythm, S1, S2 normal, no murmur, click, rub or gallop GI: soft, non-tender; bowel sounds normal; no masses,  no organomegaly Extremities: extremities normal, atraumatic, no cyanosis or edema Neurologic: Alert and oriented X 3, normal strength and tone. Normal symmetric reflexes. Normal coordination and gait  ECOG PERFORMANCE STATUS: 2 - Symptomatic, <50% confined to bed  Blood pressure 63/48, pulse 68, temperature 97.3 F (36.3 C), temperature source Oral, resp. rate 17, height 6\' 2"  (1.88 m), weight 151 lb 6.4 oz (68.675 kg), SpO2 96.00%.  LABORATORY DATA: Lab Results  Component Value Date   WBC 5.1 06/06/2013   HGB 11.8* 06/06/2013   HCT 38.4 06/06/2013   MCV 89.3 06/06/2013   PLT 141 06/06/2013      Chemistry      Component Value Date/Time   NA 143 06/06/2013  1119   NA 139 04/10/2013 1049   K 4.1 06/06/2013 1119   K 4.2 04/10/2013 1049   CL 111 04/10/2013 1049   CO2 15* 06/06/2013 1119   CO2 18* 04/10/2013 1049   BUN 89.8* 06/06/2013 1119   BUN 47* 04/10/2013 1049   CREATININE 4.0* 06/06/2013 1119   CREATININE 2.6* 04/10/2013 1049   CREATININE 1.84* 09/10/2012 0948      Component Value Date/Time   CALCIUM 8.6 06/06/2013 1119   CALCIUM 8.7 04/10/2013 1049   CALCIUM 8.9 08/23/2012 0447   ALKPHOS 98 05/16/2013 1137   ALKPHOS 109 04/10/2013 1049   AST 28 05/16/2013 1137   AST 35 04/10/2013 1049   ALT 20 05/16/2013 1137   ALT 21 04/10/2013 1049   BILITOT 0.76 05/16/2013 1137   BILITOT 0.9 04/10/2013 1049       RADIOGRAPHIC STUDIES:  Nm Pet Image Initial (pi) Skull Base To Thigh  05/03/2013   CLINICAL DATA:  Initial treatment strategy for lung lesions. History of renal cell cancer.  EXAM: NUCLEAR MEDICINE PET SKULL BASE TO THIGH  FASTING BLOOD GLUCOSE:  Value: 100mg /dl  TECHNIQUE: 19.8 mCi F-18 FDG was injected intravenously. CT data was obtained and used for attenuation correction and anatomic localization only. (This was not acquired as a diagnostic CT examination.) Additional exam technical data entered on technologist worksheet.  COMPARISON:  CT scan 04/12/2013  FINDINGS: NECK  No hypermetabolic lymph nodes in the neck.  CHEST  Numerous bilateral pulmonary nodules demonstrating neoplastic range FDG uptake with SUV max ranging between 2.0 and 6.0. Findings consistent with pulmonary metastatic disease. There is also a large left pleural effusion, increased since prior CT scan. There are pleural masses also demonstrating FDG uptake of between 3.0 and 5.6.  ABDOMEN/PELVIS  The left renal mass has been previously treated. There is FDG uptake with SUV max is 6.5. Findings suspicious for residual tumor but post ablation changes also possible.  SKELETON  No focal hypermetabolic activity to suggest skeletal metastasis.  IMPRESSION: 1. Diffuse pulmonary metastatic  disease. 2. Enlarging left-sided pleural effusion with FDG positive pleural nodules consistent with pleural metastasis. 3. Neoplastic range uptake in the left upper lobe renal mass which has been previously treated. Findings could be due to post ablation changes or persistent tumor. 4. No findings for osseous metastatic disease.   Electronically Signed   By: Kalman Jewels M.D.   On: 05/03/2013 12:11   Dg Chest 1 View  05/22/2013   CLINICAL DATA:  Evaluate for pneumothorax post left upper lobe pulmonary mass biopsy  EXAM: CHEST - 1 VIEW  COMPARISON:  CT BIOPSY dated 05/22/2013; DG CHEST 1 VIEW dated 05/07/2013; NM PET IMAGE INITIAL (PI) SKULL BASE TO THIGH dated 05/03/2013  FINDINGS: Grossly unchanged enlarged cardiac silhouette and mediastinal contours with atherosclerotic plaque within the thoracic aorta. Interval reaccumulation of moderate to large-sized left-sided pleural effusion post recent ultrasound-guided thoracentesis. Minimal worsening heterogeneous opacities adjacent to known left upper lobe pulmonary mass which may represent a small amount of post biopsy hemorrhage. No definite pneumothorax. Known nodules within the right upper and medial aspect of the right lower lung as well as additional scattered nodules within the aerated portions of the left lung appear grossly unchanged. The lungs are remain hyperexpanded. No new focal airspace opacities. Grossly unchanged bones. Vascular calcifications about the branch vessels of the aortic arch.  IMPRESSION: 1. No evidence of pneumothorax post left upper lobe biopsy. 2. Suspected minimal amount of expected peri-biopsy hemorrhage about known left upper lobe pulmonary mass. 3. Interval reaccumulation of moderate to large left-sided pleural effusion post recent ultrasound-guided thoracentesis. 4. Similar findings of known metastatic disease to the bilateral lungs.   Electronically Signed   By: Sandi Mariscal M.D.   On: 05/22/2013 11:12   Ct Biopsy  05/22/2013    INDICATION: History of renal cell carcinoma, now with multiple pulmonary masses worrisome for recurrent metastatic disease.  EXAM: CT BIOPSY  MEDICATIONS: Fentanyl 25 mcg IV; Versed 0.5 mg IV  ANESTHESIA/SEDATION: Sedation time  15 minutes  CONTRAST:  None  COMPARISON:  Chest radiograph - 05/07/2013; ultrasound  Guided thoracentesis - 05/07/2013; PET-CT - 05/03/2013  PROCEDURE: Informed consent was obtained from the patient following an explanation of the procedure, risks, benefits and alternatives. The patient understands,agrees and consents for the procedure. All questions were addressed. A time out was performed prior to the initiation of the procedure.  The patient was positioned supine on the CT table and a limited chest CT was performed for procedural planning demonstrating grossly unchanged appearance of approximately 3.9 x 3.4 cm dominant mass within  the medial aspect of the left upper lobe (image 14, series 2). Note was made of a recurrent moderate to large-sized left-sided pleural effusion.  The operative site was prepped and draped in the usual sterile fashion. Under sterile conditions and local anesthesia, a 17 gauge coaxial needle was advanced into the peripheral aspect of the nodule. Positioning was confirmed with intermittent CT fluoroscopy and followed by the acquisition of 3 core needle biopsies with an 18 gauge core needle biopsy device.  Limited post procedural chest CT was negative for pneumothorax or additional complication. The co-axial needle was removed and hemostasis was achieved with manual compression. A dressing was placed. The patient tolerated the procedure well without immediate postprocedural complication. The patient was escorted to have an upright chest radiograph.  COMPLICATIONS: None immediate.  IMPRESSION: 1. Technically successful CT guided core needle core biopsy of dominant left upper lobe pulmonary mass. 2. Prior to the initiation of the procedure, note was made of a  recurrent moderate to large sized left-sided pleural effusion.   Electronically Signed   By: Sandi Mariscal M.D.   On: 05/22/2013 12:54   REPORT OF SURGICAL PATHOLOGY FINAL DIAGNOSIS Diagnosis Lung, needle/core biopsy(ies), Left mass - METASTATIC RENAL CELL CARCINOMA, SEE COMMENT. Microscopic Comment The history of left kidney renal cell carcinoma clear cell type is noted ([id number]). The current biopsies demonstrate diagnostic features of metastatic renal cell carcinoma, clear cell type. The case is reviewed with Dr. Avis Epley who concurs. (CRR:gt, 05/23/13) Mali RUND DO Pathologist, Electronic Signature (Case signed 05/23/2013)   ASSESSMENT AND PLAN: This is a very pleasant 80 years old African American male with metastatic renal cell carcinoma with bilateral pulmonary nodules in addition to large left pleural effusion status post ultrasound-guided left thoracentesis on 05/07/2013 with drainage of 1.1 L of pleural fluid.  A CT guided core biopsy of the left upper lobe lung mass was consistent with metastatic renal cell carcinoma. I have a lengthy discussion with the patient and his family today about his current disease status and treatment options. I gave the patient the option of palliative care and hospice referral versus consideration of treatment with oral tyrosine kinase inhibitor like Votrient or Sutent. I discussed with the patient and his family the adverse effect of this treatment including fatigue, hypertension, proteinuria, skin rash, diarrhea as well as liver or renal dysfunction. The patient would like to have some time to think about his options.  If he agrees to proceed with treatment, I would consider him for treatment with Votrient 800 mg by mouth daily. The patient is expected to in the next few days to inform me about his decision.  He was advised to call immediately if he has any concerning symptoms in the interval. The patient voices understanding of current disease status  and treatment options and is in agreement with the current care plan.  All questions were answered. The patient knows to call the clinic with any problems, questions or concerns. We can certainly see the patient much sooner if necessary.  Disclaimer: This note was dictated with voice recognition software. Similar sounding words can inadvertently be transcribed and may not be corrected upon review.

## 2013-06-06 NOTE — Patient Instructions (Addendum)
Smoking Cessation, Tips for Success If you are ready to quit smoking, congratulations! You have chosen to help yourself be healthier. Cigarettes bring nicotine, tar, carbon monoxide, and other irritants into your body. Your lungs, heart, and blood vessels will be able to work better without these poisons. There are many different ways to quit smoking. Nicotine gum, nicotine patches, a nicotine inhaler, or nicotine nasal spray can help with physical craving. Hypnosis, support groups, and medicines help break the habit of smoking. WHAT THINGS CAN I DO TO MAKE QUITTING EASIER?  Here are some tips to help you quit for good:  Pick a date when you will quit smoking completely. Tell all of your friends and family about your plan to quit on that date.  Do not try to slowly cut down on the number of cigarettes you are smoking. Pick a quit date and quit smoking completely starting on that day.  Throw away all cigarettes.   Clean and remove all ashtrays from your home, work, and car.   On a card, write down your reasons for quitting. Carry the card with you and read it when you get the urge to smoke.   Cleanse your body of nicotine. Drink enough water and fluids to keep your urine clear or pale yellow. Do this after quitting to flush the nicotine from your body.   Learn to predict your moods. Do not let a bad situation be your excuse to have a cigarette. Some situations in your life might tempt you into wanting a cigarette.   Never have "just one" cigarette. It leads to wanting another and another. Remind yourself of your decision to quit.   Change habits associated with smoking. If you smoked while driving or when feeling stressed, try other activities to replace smoking. Stand up when drinking your coffee. Brush your teeth after eating. Sit in a different chair when you read the paper. Avoid alcohol while trying to quit, and try to drink fewer caffeinated beverages. Alcohol and caffeine may urge  you to smoke.   Avoid foods and drinks that can trigger a desire to smoke, such as sugary or spicy foods and alcohol.   Ask people who smoke not to smoke around you.   Have something planned to do right after eating or having a cup of coffee. For example, plan to take a walk or exercise.   Try a relaxation exercise to calm you down and decrease your stress. Remember, you may be tense and nervous for the first 2 weeks after you quit, but this will pass.   Find new activities to keep your hands busy. Play with a pen, coin, or rubber band. Doodle or draw things on paper.   Brush your teeth right after eating. This will help cut down on the craving for the taste of tobacco after meals. You can also try mouthwash.   Use oral substitutes in place of cigarettes. Try using lemon drops, carrots, cinnamon sticks, or chewing gum. Keep them handy so they are available when you have the urge to smoke.   When you have the urge to smoke, try deep breathing.   Designate your home as a nonsmoking area.   If you are a heavy smoker, ask your health care provider about a prescription for nicotine chewing gum. It can ease your withdrawal from nicotine.   Reward yourself. Set aside the cigarette money you save and buy yourself something nice.   Look for support from others. Join a support group or   smoking cessation program. Ask someone at home or at work to help you with your plan to quit smoking.   Always ask yourself, "Do I need this cigarette or is this just a reflex?" Tell yourself, "Today, I choose not to smoke," or "I do not want to smoke." You are reminding yourself of your decision to quit.  Do not replace cigarette smoking with electronic cigarettes (commonly called e-cigarettes). The safety of e-cigarettes is unknown, and some may contain harmful chemicals.  If you relapse, do not give up! Plan ahead and think about what you will do the next time you get the urge to smoke.  HOW WILL  I FEEL WHEN I QUIT SMOKING? You may have symptoms of withdrawal because your body is used to nicotine (the addictive substance in cigarettes). You may crave cigarettes, be irritable, feel very hungry, cough often, get headaches, or have difficulty concentrating. The withdrawal symptoms are only temporary. They are strongest when you first quit but will go away within 10 14 days. When withdrawal symptoms occur, stay in control. Think about your reasons for quitting. Remind yourself that these are signs that your body is healing and getting used to being without cigarettes. Remember that withdrawal symptoms are easier to treat than the major diseases that smoking can cause.  Even after the withdrawal is over, expect periodic urges to smoke. However, these cravings are generally short lived and will go away whether you smoke or not. Do not smoke!  WHAT RESOURCES ARE AVAILABLE TO HELP ME QUIT SMOKING? Your health care provider can direct you to community resources or hospitals for support, which may include:  Group support.  Education.  Hypnosis.  Therapy. Document Released: 01/08/2004 Document Revised: 01/30/2013 Document Reviewed: 09/27/2012 [facility] Patient Information 2014 [place], [place].  Pazopanib oral tablets What is this medicine? PAZOPANIB (paz [place] pa nib) is a biologic drug used to treat kidney cancer and sarcoma. This medicine may be used for other purposes; ask your health care provider or pharmacist if you have questions. COMMON BRAND NAME(S): Votrient What should I tell my health care provider before I take this medicine? They need to know if you have any of these conditions: -bleeding problems -have had recent surgery (within 7 days) or are having surgery -heart disease -high blood pressure -history of stroke -liver disease -protein in your urine -stomach or intestine problems -thyroid problems -an unusual or allergic reaction to pazopanib, other medicines, foods, dyes, or  preservatives -pregnant or trying to get pregnant -breast-feeding How should I use this medicine? Take this medicine by mouth with a glass of water. Follow the directions on the prescription label. Do not cut, crush or chew this medicine. Take this medicine on an empty stomach, at least 1 hour before or 2 hours after meals. Do not take with food. Do not take with grapefruit juice. Take your medicine at regular intervals. Do not take it more often than directed. Do not stop taking except on your doctor's advice. A special MedGuide will be given to you by the pharmacist with each prescription and refill. Be sure to read this information carefully each time. Talk to your pediatrician regarding the use of this medicine in children. Special care may be needed. Overdosage: If you think you've taken too much of this medicine contact a poison control center or emergency room at once. Overdosage: If you think you have taken too much of this medicine contact a poison control center or emergency room at once. NOTE: This medicine is  only for you. Do not share this medicine with others. What if I miss a dose? If you miss a dose, take it as soon as you can. If your next dose is to be taken in less than 12 hours, then do not take the missed dose. Take the next dose at your regular time. Do not take double or extra doses. What may interact with this medicine? Do not take this medication with any of the following medications: -rifampin This medicine may also interact with the following medications: -certain medicines for stomach problems like cimetidine, famotidine, omeprazole, lansoprazole -clarithromycin -dextromethorphan -grapefruit or grapefruit juice -ketoconazole -lapatinib -certain medicines for irregular heart beat like amiodarone, bepridil, dofetilide, encainide, flecainide, propafenone, quinidine -midazolam -paclitaxel -ritonavir This list may not describe all possible interactions. Give your  health care provider a list of all the medicines, herbs, non-prescription drugs, or dietary supplements you use. Also tell them if you smoke, drink alcohol, or use illegal drugs. Some items may interact with your medicine. What should I watch for while using this medicine? Visit your doctor for regular check ups. You will need to have blood work while you are taking this medicine. This drug may make you feel generally unwell. This is not uncommon, as chemotherapy can affect healthy cells as well as cancer cells. Report any side effects. Continue your course of treatment even though you feel ill unless your doctor tells you to stop. Call your doctor or health care professional for advice if you get a fever, chills or sore throat, or other symptoms of a cold or flu. Do not treat yourself. This drug decreases your body's ability to fight infections. Try to avoid being around people who are sick. This medicine may increase your risk to bruise or bleed. Call your doctor or health care professional if you notice any unusual bleeding. Be careful brushing and flossing your teeth or using a toothpick because you may get an infection or bleed more easily. If you have any dental work done, tell your dentist you are receiving this medicine. Avoid taking products that contain aspirin, acetaminophen, ibuprofen, naproxen, or ketoprofen unless instructed by your doctor. These medicines may hide a fever. Women should inform their doctor if they wish to become pregnant or think they might be pregnant. There is a potential for serious side effects to an unborn child. Talk to your health care professional or pharmacist for more information. If you are going to have surgery or any other procedures, tell your doctor you are taking this medicine. What side effects may I notice from receiving this medicine? Side effects that you should report to your doctor or health care professional as soon as possible: -abdominal  pain -allergic reactions like skin rash, itching or hives, swelling of the face, lips, or tongue -black, tarry stool -breathing problems -changes in vision -chest pain -confusion, trouble speaking or understanding -fast, irregular heartbeat -fever or chills, sore throat -seizures -severe headaches -sudden numbness or weakness of the face, arm or leg -trouble passing urine or change in the amount of urine -trouble walking, dizziness, loss of balance or coordination -unusual bleeding or bruising -unusually weak or tired -yellowing of the eyes or skin Side effects that usually do not require medical attention (Report these to your doctor or health care professional if they continue or are bothersome.): -change in hair color -loose or watery stool -loss of appetite -nausea, vomiting -unusually weak or tired This list may not describe all possible side effects. Call your doctor for  medical advice about side effects. You may report side effects to FDA at 1-800-FDA-1088. Where should I keep my medicine? Keep out of the reach of children. Store at room temperature between 15 and 30 degrees C (59 and 86 degrees F). Throw away any unused medicine after the expiration date. NOTE: This sheet is a summary. It may not cover all possible information. If you have questions about this medicine, talk to your doctor, pharmacist, or health care provider.  2014, [employer]vier/[employer] Standard. (2012-09-27 14:57:23)

## 2013-06-07 ENCOUNTER — Other Ambulatory Visit: Payer: Self-pay | Admitting: Medical Oncology

## 2013-06-09 DIAGNOSIS — C649 Malignant neoplasm of unspecified kidney, except renal pelvis: Secondary | ICD-10-CM | POA: Insufficient documentation

## 2013-06-10 ENCOUNTER — Other Ambulatory Visit: Payer: Self-pay | Admitting: Medical Oncology

## 2013-06-11 ENCOUNTER — Telehealth: Payer: Self-pay | Admitting: Medical Oncology

## 2013-06-11 NOTE — Telephone Encounter (Signed)
Faxed rx to [facility] for chemotherapy pill with insurance information and office note.pt notified.

## 2013-06-12 NOTE — Telephone Encounter (Addendum)
Fax from [facility] that script for Votrient was forwarded to [facility], which is in his network. Phone [phone] Fax # [phone]

## 2013-06-13 ENCOUNTER — Inpatient Hospital Stay (HOSPITAL_COMMUNITY)
Admission: EM | Admit: 2013-06-13 | Discharge: 2013-06-17 | DRG: 180 | Disposition: A | Discharge status: Hospice | Payer: Medicare Other | Attending: Pulmonary Disease | Admitting: Pulmonary Disease

## 2013-06-13 ENCOUNTER — Emergency Department (HOSPITAL_COMMUNITY): Payer: Medicare Other

## 2013-06-13 DIAGNOSIS — F17200 Nicotine dependence, unspecified, uncomplicated: Secondary | ICD-10-CM | POA: Diagnosis present

## 2013-06-13 DIAGNOSIS — I739 Peripheral vascular disease, unspecified: Secondary | ICD-10-CM | POA: Diagnosis present

## 2013-06-13 DIAGNOSIS — N179 Acute kidney failure, unspecified: Secondary | ICD-10-CM

## 2013-06-13 DIAGNOSIS — E1049 Type 1 diabetes mellitus with other diabetic neurological complication: Secondary | ICD-10-CM | POA: Diagnosis present

## 2013-06-13 DIAGNOSIS — K219 Gastro-esophageal reflux disease without esophagitis: Secondary | ICD-10-CM | POA: Diagnosis present

## 2013-06-13 DIAGNOSIS — N189 Chronic kidney disease, unspecified: Secondary | ICD-10-CM | POA: Diagnosis present

## 2013-06-13 DIAGNOSIS — I2510 Atherosclerotic heart disease of native coronary artery without angina pectoris: Secondary | ICD-10-CM

## 2013-06-13 DIAGNOSIS — E872 Acidosis, unspecified: Secondary | ICD-10-CM | POA: Diagnosis present

## 2013-06-13 DIAGNOSIS — J910 Malignant pleural effusion: Principal | ICD-10-CM | POA: Diagnosis present

## 2013-06-13 DIAGNOSIS — E119 Type 2 diabetes mellitus without complications: Secondary | ICD-10-CM

## 2013-06-13 DIAGNOSIS — N289 Disorder of kidney and ureter, unspecified: Secondary | ICD-10-CM

## 2013-06-13 DIAGNOSIS — J9690 Respiratory failure, unspecified, unspecified whether with hypoxia or hypercapnia: Secondary | ICD-10-CM

## 2013-06-13 DIAGNOSIS — Z515 Encounter for palliative care: Secondary | ICD-10-CM

## 2013-06-13 DIAGNOSIS — J9600 Acute respiratory failure, unspecified whether with hypoxia or hypercapnia: Secondary | ICD-10-CM

## 2013-06-13 DIAGNOSIS — Z794 Long term (current) use of insulin: Secondary | ICD-10-CM

## 2013-06-13 DIAGNOSIS — Z66 Do not resuscitate: Secondary | ICD-10-CM | POA: Diagnosis present

## 2013-06-13 DIAGNOSIS — E46 Unspecified protein-calorie malnutrition: Secondary | ICD-10-CM | POA: Diagnosis present

## 2013-06-13 DIAGNOSIS — C7800 Secondary malignant neoplasm of unspecified lung: Secondary | ICD-10-CM | POA: Diagnosis present

## 2013-06-13 DIAGNOSIS — Z79899 Other long term (current) drug therapy: Secondary | ICD-10-CM

## 2013-06-13 DIAGNOSIS — J90 Pleural effusion, not elsewhere classified: Secondary | ICD-10-CM

## 2013-06-13 DIAGNOSIS — Z8673 Personal history of transient ischemic attack (TIA), and cerebral infarction without residual deficits: Secondary | ICD-10-CM

## 2013-06-13 DIAGNOSIS — Z681 Body mass index (BMI) 19 or less, adult: Secondary | ICD-10-CM

## 2013-06-13 DIAGNOSIS — C782 Secondary malignant neoplasm of pleura: Secondary | ICD-10-CM | POA: Diagnosis present

## 2013-06-13 DIAGNOSIS — C649 Malignant neoplasm of unspecified kidney, except renal pelvis: Secondary | ICD-10-CM

## 2013-06-13 DIAGNOSIS — E7800 Pure hypercholesterolemia, unspecified: Secondary | ICD-10-CM | POA: Diagnosis present

## 2013-06-13 LAB — I-STAT ARTERIAL BLOOD GAS, ED
ACID-BASE DEFICIT: 18 mmol/L — AB (ref 0.0–2.0)
Bicarbonate: 13.2 mEq/L — ABNORMAL LOW (ref 20.0–24.0)
O2 SAT: 95 %
Patient temperature: 98.6
TCO2: 15 mmol/L (ref 0–100)
pCO2 arterial: 52.1 mmHg — ABNORMAL HIGH (ref 35.0–45.0)
pH, Arterial: 7.012 — CL (ref 7.350–7.450)
pO2, Arterial: 111 mmHg — ABNORMAL HIGH (ref 80.0–100.0)

## 2013-06-13 LAB — CBC WITH DIFFERENTIAL/PLATELET
BASOS PCT: 0 % (ref 0–1)
Basophils Absolute: 0 10*3/uL (ref 0.0–0.1)
Eosinophils Absolute: 0 10*3/uL (ref 0.0–0.7)
Eosinophils Relative: 0 % (ref 0–5)
HCT: 42.2 % (ref 39.0–52.0)
Hemoglobin: 13.7 g/dL (ref 13.0–17.0)
LYMPHS PCT: 7 % — AB (ref 12–46)
Lymphs Abs: 0.5 10*3/uL — ABNORMAL LOW (ref 0.7–4.0)
MCH: 28.8 pg (ref 26.0–34.0)
MCHC: 32.5 g/dL (ref 30.0–36.0)
MCV: 88.7 fL (ref 78.0–100.0)
MONOS PCT: 5 % (ref 3–12)
Monocytes Absolute: 0.3 10*3/uL (ref 0.1–1.0)
NEUTROS PCT: 88 % — AB (ref 43–77)
Neutro Abs: 6.5 10*3/uL (ref 1.7–7.7)
Platelets: 147 10*3/uL — ABNORMAL LOW (ref 150–400)
RBC: 4.76 MIL/uL (ref 4.22–5.81)
RDW: 16.7 % — ABNORMAL HIGH (ref 11.5–15.5)
WBC: 7.4 10*3/uL (ref 4.0–10.5)

## 2013-06-13 LAB — BASIC METABOLIC PANEL
BUN: 121 mg/dL — AB (ref 6–23)
CHLORIDE: 106 meq/L (ref 96–112)
CO2: 12 mEq/L — ABNORMAL LOW (ref 19–32)
Calcium: 8.7 mg/dL (ref 8.4–10.5)
Creatinine, Ser: 4.76 mg/dL — ABNORMAL HIGH (ref 0.50–1.35)
GFR, EST AFRICAN AMERICAN: 12 mL/min — AB (ref >90)
GFR, EST NON AFRICAN AMERICAN: 10 mL/min — AB (ref >90)
Glucose, Bld: 242 mg/dL — ABNORMAL HIGH (ref 70–99)
POTASSIUM: 4.8 meq/L (ref 3.7–5.3)
SODIUM: 145 meq/L (ref 137–147)

## 2013-06-13 LAB — I-STAT TROPONIN, ED: Troponin i, poc: 1.49 ng/mL (ref 0.00–0.08)

## 2013-06-13 LAB — I-STAT CG4 LACTIC ACID, ED: LACTIC ACID, VENOUS: 3.86 mmol/L — AB (ref 0.5–2.2)

## 2013-06-13 MED ORDER — VANCOMYCIN HCL IN DEXTROSE 1-5 GM/200ML-% IV SOLN
1000.0000 mg | Freq: Once | INTRAVENOUS | Status: AC
  Administered 2013-06-13: 1000 mg via INTRAVENOUS
  Filled 2013-06-13: qty 200

## 2013-06-13 MED ORDER — CEFEPIME HCL 1 G IJ SOLR
1.0000 g | Freq: Once | INTRAMUSCULAR | Status: AC
  Administered 2013-06-14: 1 g via INTRAVENOUS
  Filled 2013-06-13: qty 1

## 2013-06-13 NOTE — ED Provider Notes (Signed)
Patient seen/examined in the Emergency Department in conjunction with Resident Physician Provider Justin Mend Patient reports sob with h/o lung cancer Exam : pt is slow to respond, tachypneic and on bipap on arrival Plan: pt is ill appearing but he is starting arouse.  He reports he would like to be intubated if necessary Will follow closely   Sharyon Cable, MD 06/13/13 2204

## 2013-06-13 NOTE — ED Notes (Signed)
Per EMS: Patient coming from home with SOB and respiratory distress. Pt placed on CPAP by EMS. Difficulty breathing majorty of the day. Hx of Lung (L) and Kidney CA. Diminished on L side, rales in the R lung. Ax4, 98/Pal, 94-95.

## 2013-06-13 NOTE — H&P (Signed)
Name: [name] MRN: [id number] DOB: [birth year]    ADMISSION DATE:  06/13/2013 PRIMARY SERVICE: PCCM  CHIEF COMPLAINT:  Weakness   BRIEF PATIENT DESCRIPTION:  80 yom w/ metastatic renal cell CA (lung mets on biopsy 05/22/13) admitted on 2/19 w/ cc: weakness and progressive SOB. CXR demonstrated complete opacification of the left hemithorax  W/ mediastinal shift (had known pleural effusion and had prior 1.1 liters removed mid January which showed atypical cells). Placed on NIPPV for resp distress. PCCM asked to admit given concern that pt would need emergent intervention re: his large effusion.   Was last seen by oncology on 2/12 - opted for oral tyrosine kinase inhibitor (Votrient), was offered hospice.  SIGNIFICANT EVENTS / STUDIES:  2/19 left thoracentesis. 1.5 bloody exudate sample Cytology from left effusion 2/19>>>  LINES / TUBES:   CULTURES: BC x2 2/20>>> UC 2/20>>> Pleural fluid 2/20>>>  ANTIBIOTICS: levaquin 2/20>>>  HISTORY OF PRESENT ILLNESS:   80 year old male w/ metastatic RCCA which involves mets to lungs and likely pleura. Was last seen by oncology on 2/12 which goals of care and treatment options were discussed. At that time he had opted to with oral tyrosine kinase inhibitor (Votrient). At this point he was in good spirits w/ no acute distress. The family notes that slowly and progressively over the following days he developed progressive shortness of breath. Of not on his visit at the cancer center his cr was 4 at that time. Presented to ER on 2/19 as shortness of breath progressed to resting dyspnea and he was becoming progressively weak w/ decreasing po intake. On ER evaluation he was noted to be in acute respiratory distress and CXR showed total opacification of the left chest with heart/mediastinum laterally displaced to the right . He had had 1.1 liter pleural fluid drained in mid January and at that time the analysis showed atypical cells but was not  diagnostic of a malignant sample. Review of prior CT chest showed sig fluid collection back on 1/28. We went ahead and did urgent left sided thoracentesis at the bedside in the ER which yielded 1.5 of exudative appearing bloody pleural fluid. This dramatically decreased his work of breathing and we were able to get him off NIPPV. He will be admitted to SDU, treated empirically for possible CAP. If improves may be a candidate for pleur-x cath.    PAST MEDICAL HISTORY :  Past Medical History  Diagnosis Date  . Hypertension   . Anemia   . PAD (peripheral artery disease) 10/10    Angiogram  Dr Trula Slade , severe stenosis left profunda after insertion of aorto-bifemoral  bypass graft-total occulsionof left SFA. Collateral flow through the profunda femoral artery.  Marland Kitchen CAD in native artery 2003    by cath   . Hypercholesteremia   . Thyroid disease   . Thyroiditis, unspecified   . Osteoarthritis of spine   . Olecranon bursitis   . 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 GERD (gastroesophageal reflux disease)   . Diabetes mellitus   . History of colonic polyps   . Iron deficiency anemia, unspecified   . Leg pain   . Numbness   . History of colonoscopy   . Chronic kidney disease   . History of blood transfusion   . Stroke    Past Surgical History  Procedure Laterality Date  . Appendectomy    . Aortobifemoral bypass  2002  . Electrocardiogram  01/12/2006  . Stress  cardiolite  04/14/2003  . Pr vein bypass graft,aorto-fem-pop  2002  . Renal artery stent  11/16/2010    Right renal artery  . Eye surgery      Cataract  . Embolectomy  03/08/2012    Procedure: EMBOLECTOMY;  Surgeon: Mal Misty, MD;  Location[facility];  Service: Vascular;  Laterality: Left;  THROMBECTOMY OF LEFT LIMB OF AORTO-FEMORAL BYPASS GRAFT  . Patch angioplasty  03/08/2012    Procedure: PATCH ANGIOPLASTY;  Surgeon: Mal Misty, MD;  Location: [facility];  Service: Vascular;  Laterality: Left;  Left  fropunda endarterectomy with patch angioplasty   Prior to Admission medications   Medication Sig Start Date End Date Taking? Authorizing Provider  atenolol (TENORMIN) 25 MG tablet Take 1 tablet (25 mg total) by mouth every morning. 04/26/12   Renato Shin, MD  atorvastatin (LIPITOR) 40 MG tablet Take 1 tablet (40 mg total) by mouth daily. 08/30/12   Renato Shin, MD  calcitRIOL (ROCALTROL) 0.5 MCG capsule Take 0.5 mcg by mouth daily.     Historical Provider, MD  dipyridamole-aspirin (AGGRENOX) 200-25 MG per 12 hr capsule Take 1 capsule by mouth 2 (two) times daily. 08/23/12   Renato Shin, MD  furosemide (LASIX) 80 MG tablet Take 80 mg by mouth daily.  03/23/13   Historical Provider, MD  glucose blood (ONE TOUCH ULTRA TEST) test strip 1 each by Other route 3 (three) times daily. And lancets 3/day 250.01 08/23/12   Renato Shin, MD  hydrochlorothiazide (HYDRODIURIL) 25 MG tablet Take 1 tablet (25 mg total) by mouth every morning. 05/13/13   Burnell Blanks, MD  HYDROcodone-acetaminophen (NORCO) 10-325 MG per tablet Take 1 tablet by mouth every 4 (four) hours as needed for moderate pain. 05/10/13   Renato Shin, MD  Insulin Lispro Prot & Lispro (HUMALOG 75/25 MIX) (75-25) 100 UNIT/ML Kwikpen Inject 11-16 Units into the skin 2 (two) times daily. Inject under the skin twice daily 16 units with breakfast and 11 units with evening meal 03/28/13   Renato Shin, MD  Insulin Pen Needle 31G X 8 MM MISC 1 Device by Does not apply route 2 (two) times daily. 06/18/12   Renato Shin, MD  sildenafil (VIAGRA) 100 MG tablet Take 1 tablet (100 mg total) by mouth daily as needed. For sexual activity 05/04/12   Renato Shin, MD   Allergies  Allergen Reactions  . Clopidogrel Bisulfate Hives    FAMILY HISTORY:  Family History  Problem Relation Age of Onset  . Kidney disease Sister   . Heart attack Father   . Coronary artery disease Father   . Cancer Mother    SOCIAL HISTORY:  reports that he has been smoking  Cigarettes.  He has a 45 pack-year smoking history. He has never used smokeless tobacco. He reports that he does not drink alcohol or use illicit drugs.  REVIEW OF SYSTEMS:  Unable , on bipap  SUBJECTIVE:  Feels better  VITAL SIGNS: Temp:  [95.9 F (35.5 C)] 95.9 F (35.5 C) (02/19 2158) Pulse Rate:  [81] 81 (02/19 2155) Resp:  [22-28] 22 (02/19 2300) BP: (106-123)/(62-67) 106/62 mmHg (02/20 0027) FiO2 (%):  [100 %] 100 % (02/19 2155) HEMODYNAMICS:   VENTILATOR SETTINGS: Vent Mode:  [-] BIPAP FiO2 (%):  [100 %] 100 % Set Rate:  [15 bmp] 15 bmp INTAKE / OUTPUT: Intake/Output   None     PHYSICAL EXAMINATION: General:  Frail 80 year old male, now breathing easier after lg volume thoracentesis Neuro:  Awake, weak, no focal def  HEENT:  MM dry no JVD  Cardiovascular:  Loletha Grayer rrr Lungs:  Crackles on right, decreased on left, still some residual accessory muscle effort  Abdomen:  Soft, non-tender  Musculoskeletal:  Intact  Skin:  Bilateral LE edema   LABS:  CBC  Recent Labs Lab 06/13/13 2211  WBC 7.4  HGB 13.7  HCT 42.2  PLT 147*   Coag's No results found for this basename: APTT, INR,  in the last 168 hours BMET  Recent Labs Lab 06/13/13 2211  NA 145  K 4.8  CL 106  CO2 12*  BUN 121*  CREATININE 4.76*  GLUCOSE 242*   Electrolytes  Recent Labs Lab 06/13/13 2211  CALCIUM 8.7   Sepsis Markers  Recent Labs Lab 06/13/13 2222  LATICACIDVEN 3.86*   ABG  Recent Labs Lab 06/13/13 2313  PHART 7.012*  PCO2ART 52.1*  PO2ART 111.0*   Liver Enzymes No results found for this basename: AST, ALT, ALKPHOS, BILITOT, ALBUMIN,  in the last 168 hours Cardiac Enzymes  Recent Labs Lab 06/13/13 2211  PROBNP >70000.0*   Glucose No results found for this basename: GLUCAP,  in the last 168 hours  Imaging Dg Chest Port 1 View  06/14/2013   CLINICAL DATA:  Respiratory distress.  Post left thoracentesis.  EXAM: PORTABLE CHEST - 1 VIEW  COMPARISON:   06/13/2013  FINDINGS: Decreasing left pleural effusion since prior study with increasing aeration of the left upper lobe. Continued large left pleural effusion. No pneumothorax.  Cardiomegaly. Right lung is clear. Airspace disease within the left aerated lung, including rounded density in the left apex. This is compatible with the known left apical mass.  IMPRESSION: Decreasing left effusion following thoracentesis. No pneumothorax. Continued large left effusion and left lung airspace disease including left apical mass.  COPD, cardiomegaly.   Electronically Signed   By: Rolm Baptise M.D.   On: 06/14/2013 00:03   Dg Chest Portable 1 View  06/13/2013   CLINICAL DATA:  Respiratory distress.  Lung cancer.  EXAM: PORTABLE CHEST - 1 VIEW  COMPARISON:  05/22/2013  FINDINGS: Complete opacification of the left hemi thorax. This likely is related to a large effusion as there is shift of mediastinal structures to the right. Hyperinflation of the right lung. Calcifications in the apices bilaterally. No effusion on the right. Mild cardiomegaly.  IMPRESSION: Complete white out of the left hemi thorax, likely related to large effusion as there is shift of mediastinal structures away from the left.  Hyperinflation/COPD changes noted on the right.  Cardiomegaly.   Electronically Signed   By: Rolm Baptise M.D.   On: 06/13/2013 22:15     CXR: Decreasing left effusion following thoracentesis. No pneumothorax. Continued large left effusion and left lung airspace disease including left apical mass.   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 Prob in setting of uremia  P:   Will add bicarb gtt given profound acidosis and desire to decrease resp effort Ck ketones Hydrate   GASTROINTESTINAL A:  Protein cal malnutrition  P:   Nutritional consult   HEMATOLOGIC A:  Metastatic renal cell carcinoma w/ mets to lung and likely pleura  >f/b mohammad. Offered palliation vs Votrient. Has yet to start RX. Doubt he is a candidate  P:  Trend CBC  [place] heparin   INFECTIOUS A:  R/o PNA  P:   Empiric levaquin   ENDOCRINE A: DM P:   CBGs/SSI   NEUROLOGIC A:  Generalized weakness  P:   Supportive care  TODAY'S SUMMARY:  Long discussion w/ patient and family. Likely this is a malignant effusion. His over all status is poor. Pt and family agree to DNR status but are interested in possibility of pleur-x of his status improved to point he can tolerate. Would advocate for palliation & possible hospice - needs further discussion with oncology [facility])    I have personally obtained a history, examined the patient, evaluated laboratory and imaging results, formulated the assessment and plan and placed orders. CRITICAL CARE: The patient is critically ill with multiple organ systems failure and requires high complexity decision making for assessment and support, frequent evaluation and titration of therapies, application of advanced monitoring technologies and extensive interpretation of multiple databases. Critical Care Time devoted to patient care services described in this note is 60 minutes.   Rigoberto Noel Pulmonary and [place] Pager: [phone]  06/14/2013, 12:35 AM

## 2013-06-13 NOTE — ED Notes (Signed)
I stat lactic acid results given to Dr. Christy Gentles by B. Yolanda Bonine, EMT

## 2013-06-13 NOTE — ED Notes (Signed)
I stat troponin results given to Dr. Christy Gentles and Naomie Dean RN by B. Yolanda Bonine, EMT

## 2013-06-14 ENCOUNTER — Encounter (HOSPITAL_COMMUNITY): Payer: Self-pay | Admitting: *Deleted

## 2013-06-14 ENCOUNTER — Inpatient Hospital Stay (HOSPITAL_COMMUNITY): Payer: Medicare Other

## 2013-06-14 DIAGNOSIS — C649 Malignant neoplasm of unspecified kidney, except renal pelvis: Secondary | ICD-10-CM

## 2013-06-14 DIAGNOSIS — J9600 Acute respiratory failure, unspecified whether with hypoxia or hypercapnia: Secondary | ICD-10-CM

## 2013-06-14 DIAGNOSIS — N179 Acute kidney failure, unspecified: Secondary | ICD-10-CM

## 2013-06-14 DIAGNOSIS — J90 Pleural effusion, not elsewhere classified: Secondary | ICD-10-CM

## 2013-06-14 LAB — PRO B NATRIURETIC PEPTIDE: Pro B Natriuretic peptide (BNP): >70000 pg/mL — ABNORMAL HIGH (ref 0–450)

## 2013-06-14 LAB — CBC
HEMATOCRIT: 37 % — AB (ref 39.0–52.0)
Hemoglobin: 11.9 g/dL — ABNORMAL LOW (ref 13.0–17.0)
MCH: 27.9 pg (ref 26.0–34.0)
MCHC: 32.2 g/dL (ref 30.0–36.0)
MCV: 86.9 fL (ref 78.0–100.0)
Platelets: 113 10*3/uL — ABNORMAL LOW (ref 150–400)
RBC: 4.26 MIL/uL (ref 4.22–5.81)
RDW: 16.7 % — AB (ref 11.5–15.5)
WBC: 7.5 10*3/uL (ref 4.0–10.5)

## 2013-06-14 LAB — PROTEIN, BODY FLUID: TOTAL PROTEIN, FLUID: 4.1 g/dL

## 2013-06-14 LAB — BASIC METABOLIC PANEL
BUN: 119 mg/dL — ABNORMAL HIGH (ref 6–23)
CO2: 13 mEq/L — ABNORMAL LOW (ref 19–32)
CREATININE: 4.61 mg/dL — AB (ref 0.50–1.35)
Calcium: 8.1 mg/dL — ABNORMAL LOW (ref 8.4–10.5)
Chloride: 104 mEq/L (ref 96–112)
GFR calc non Af Amer: 11 mL/min — ABNORMAL LOW (ref >90)
GFR, EST AFRICAN AMERICAN: 13 mL/min — AB (ref >90)
GLUCOSE: 297 mg/dL — AB (ref 70–99)
Potassium: 5.1 mEq/L (ref 3.7–5.3)
Sodium: 142 mEq/L (ref 137–147)

## 2013-06-14 LAB — BODY FLUID CELL COUNT WITH DIFFERENTIAL
EOS FL: 1 %
LYMPHS FL: 33 %
MONOCYTE-MACROPHAGE-SEROUS FLUID: 59 % (ref 50–90)
Neutrophil Count, Fluid: 7 % (ref 0–25)
Total Nucleated Cell Count, Fluid: 279 cu mm (ref 0–1000)

## 2013-06-14 LAB — GLUCOSE, CAPILLARY
GLUCOSE-CAPILLARY: 235 mg/dL — AB (ref 70–99)
GLUCOSE-CAPILLARY: 244 mg/dL — AB (ref 70–99)
Glucose-Capillary: 247 mg/dL — ABNORMAL HIGH (ref 70–99)
Glucose-Capillary: 203 mg/dL — ABNORMAL HIGH (ref 70–99)

## 2013-06-14 LAB — MRSA PCR SCREENING: MRSA by PCR: NEGATIVE

## 2013-06-14 LAB — LACTATE DEHYDROGENASE, PLEURAL OR PERITONEAL FLUID: LD, Fluid: 256 U/L — ABNORMAL HIGH (ref 3–23)

## 2013-06-14 LAB — GRAM STAIN

## 2013-06-14 LAB — PATHOLOGIST SMEAR REVIEW: Path Review: REACTIVE

## 2013-06-14 MED ORDER — SODIUM BICARBONATE 8.4 % IV SOLN
INTRAVENOUS | Status: DC
  Administered 2013-06-14 – 2013-06-16 (×7): via INTRAVENOUS
  Filled 2013-06-14 (×13): qty 150

## 2013-06-14 MED ORDER — MORPHINE SULFATE 2 MG/ML IJ SOLN
1.0000 mg | INTRAMUSCULAR | Status: DC | PRN
  Administered 2013-06-14 (×2): 4 mg via INTRAVENOUS
  Administered 2013-06-16 – 2013-06-17 (×3): 2 mg via INTRAVENOUS
  Filled 2013-06-14: qty 2
  Filled 2013-06-14: qty 1
  Filled 2013-06-14: qty 2
  Filled 2013-06-14: qty 1
  Filled 2013-06-14: qty 2
  Filled 2013-06-14: qty 1

## 2013-06-14 MED ORDER — LEVOFLOXACIN IN D5W 500 MG/100ML IV SOLN: 500.0000 mg | INTRAVENOUS | Status: DC

## 2013-06-14 MED ORDER — MORPHINE SULFATE 2 MG/ML IJ SOLN: 1.0000 mg | INTRAMUSCULAR | Status: DC | PRN

## 2013-06-14 MED ORDER — SODIUM CHLORIDE 0.9 % IV SOLN
250.0000 mL | INTRAVENOUS | Status: DC | PRN
  Administered 2013-06-14: 01:00:00 via INTRAVENOUS

## 2013-06-14 MED ORDER — HEPARIN SODIUM (PORCINE) 5000 UNIT/ML IJ SOLN
5000.0000 [IU] | Freq: Three times a day (TID) | INTRAMUSCULAR | Status: DC
  Administered 2013-06-14: 5000 [IU] via SUBCUTANEOUS
  Filled 2013-06-14 (×4): qty 1

## 2013-06-14 MED ORDER — HYDROCODONE-ACETAMINOPHEN 10-325 MG PO TABS
1.0000 | ORAL_TABLET | ORAL | Status: DC | PRN
  Administered 2013-06-16 – 2013-06-17 (×3): 1 via ORAL
  Filled 2013-06-14 (×3): qty 1

## 2013-06-14 MED ORDER — ENOXAPARIN SODIUM 40 MG/0.4ML ~~LOC~~ SOLN
40.0000 mg | SUBCUTANEOUS | Status: DC
  Administered 2013-06-14 – 2013-06-15 (×2): 40 mg via SUBCUTANEOUS
  Filled 2013-06-14 (×3): qty 0.4

## 2013-06-14 MED ORDER — MORPHINE SULFATE 2 MG/ML IJ SOLN
1.0000 mg | INTRAMUSCULAR | Status: DC | PRN
  Administered 2013-06-14: 2 mg via INTRAVENOUS
  Administered 2013-06-14: 1 mg via INTRAVENOUS
  Administered 2013-06-14: 2 mg via INTRAVENOUS
  Filled 2013-06-14 (×3): qty 1

## 2013-06-14 MED ORDER — LEVOFLOXACIN IN D5W 750 MG/150ML IV SOLN
750.0000 mg | Freq: Once | INTRAVENOUS | Status: AC
  Administered 2013-06-14: 750 mg via INTRAVENOUS
  Filled 2013-06-14: qty 150

## 2013-06-14 MED ORDER — CEFAZOLIN SODIUM-DEXTROSE 2-3 GM-% IV SOLR
INTRAVENOUS | Status: AC
  Filled 2013-06-14: qty 50

## 2013-06-14 MED ORDER — FENTANYL 25 MCG/HR TD PT72
25.0000 ug | MEDICATED_PATCH | TRANSDERMAL | Status: DC
  Administered 2013-06-14: 25 ug via TRANSDERMAL
  Filled 2013-06-14: qty 1

## 2013-06-14 MED ORDER — CEFAZOLIN SODIUM-DEXTROSE 2-3 GM-% IV SOLR
2.0000 g | Freq: Once | INTRAVENOUS | Status: AC
  Administered 2013-06-14: 2 g via INTRAVENOUS
  Filled 2013-06-14: qty 50

## 2013-06-14 NOTE — Procedures (Signed)
Thoracentesis Procedure Note  Pre-operative Diagnosis: left malignant pleural effusion  Post-operative Diagnosis: same  Indications: left malignant pleural effusion with acute resp failure requiring bipap  Procedure Details  Consent: Informed consent was obtained. Risks of the procedure were discussed including: infection, bleeding, pain, pneumothorax.  Under sterile conditions the patient was positioned. Betadine solution and sterile drapes were utilized.  1% plain lidocaine was used to anesthetize the left  6-7 rib space. Bloody Fluid was obtained without any difficulties and minimal blood loss.  A dressing was applied to the wound and wound care instructions were provided.   Findings 1.5L  ml of bloody pleural fluid was obtained. A sample was sent to Pathology for and cell counts, cytology as well as for infection analysis.  Complications:  None; patient tolerated the procedure well.          Condition: stable, was able to come off bipap  Plan A follow up chest x-ray was ordered. Bed Rest for 2 hours. Tylenol 650 mg. for pain.  Attending Attestation: I was present and scrubbed for the entire procedure, performed by Marni Griffon NP  [place]  [phone]

## 2013-06-14 NOTE — ED Provider Notes (Signed)
CSN: [id number]     Arrival date & time 06/13/13  2152 History   First MD Initiated Contact with Patient 06/13/13 2156     Chief Complaint  Patient presents with  . Respiratory Distress   HPI Comments: 80 yo M hx of metastatic renal cell carcinoma, HTN, HLD, PAD, CAD, DMII, CVA, presents with CC of dyspnea.  Son states pt's dyspnea has been worsening over the course of the last week.  Dyspnea initially exertional, and now profound even at rest, with associated cough.  Denies fever, chills, CP, abdominal pain, nausea, vomiting, diarrhea, myalgias, rash, or any other symptoms.  Pt has been taking prescription meds as written.  Pt has metastatic disease to lung which was diagnosed by biopsy approximately 3 weeks ago.  EMS was called 2/2 respiratory distress.  Initial O2 sat not recorded by EMS, but was immediately placed on CPAP, and arrived to the ED on this.    The history is provided by the patient, a relative and the EMS personnel. No language interpreter was used.    Past Medical History  Diagnosis Date  . Hypertension   . Anemia   . PAD (peripheral artery disease) 10/10    Angiogram  Dr Trula Slade , severe stenosis left profunda after insertion of aorto-bifemoral  bypass graft-total occulsionof left SFA. Collateral flow through the profunda femoral artery.  Marland Kitchen CAD in native artery 2003    by cath   . Hypercholesteremia   . Thyroid disease   . Thyroiditis, unspecified   . Osteoarthritis of spine   . Olecranon bursitis   . 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 GERD (gastroesophageal reflux disease)   . Diabetes mellitus   . History of colonic polyps   . Iron deficiency anemia, unspecified   . Leg pain   . Numbness   . History of colonoscopy   . Chronic kidney disease   . History of blood transfusion   . Stroke    Past Surgical History  Procedure Laterality Date  . Appendectomy    . Aortobifemoral bypass  2002  . Electrocardiogram  01/12/2006  . Stress  cardiolite  04/14/2003  . Pr vein bypass graft,aorto-fem-pop  2002  . Renal artery stent  11/16/2010    Right renal artery  . Eye surgery      Cataract  . Embolectomy  03/08/2012    Procedure: EMBOLECTOMY;  Surgeon: Mal Misty, MD;  Location: [facility] OR;  Service: Vascular;  Laterality: Left;  THROMBECTOMY OF LEFT LIMB OF AORTO-FEMORAL BYPASS GRAFT  . Patch angioplasty  03/08/2012    Procedure: PATCH ANGIOPLASTY;  Surgeon: Mal Misty, MD;  Location: [facility] OR;  Service: Vascular;  Laterality: Left;  Left fropunda endarterectomy with patch angioplasty   Family History  Problem Relation Age of Onset  . Kidney disease Sister   . Heart attack Father   . Coronary artery disease Father   . Cancer Mother    History  Substance Use Topics  . Smoking status: Current Every Day Smoker -- 0.75 packs/day for 60 years    Types: Cigarettes  . Smokeless tobacco: Never Used  . Alcohol Use: No    Review of Systems  Constitutional: Negative for fever and chills.  Respiratory: Positive for cough and shortness of breath. Negative for wheezing and stridor.   Cardiovascular: Negative for chest pain.  Gastrointestinal: Negative for nausea, vomiting, abdominal pain and diarrhea.  Musculoskeletal: Negative for myalgias.  Skin: Negative for rash.  Neurological: Negative  for dizziness, weakness, light-headedness, numbness and headaches.  Hematological: Negative for adenopathy. Does not bruise/bleed easily.  All other systems reviewed and are negative.      Allergies  Clopidogrel bisulfate  Home Medications   Current Outpatient Rx  Name  Route  Sig  Dispense  Refill  . atenolol (TENORMIN) 25 MG tablet   Oral   Take 1 tablet (25 mg total) by mouth every morning.   30 tablet   3   . atorvastatin (LIPITOR) 40 MG tablet   Oral   Take 1 tablet (40 mg total) by mouth daily.   90 tablet   3   . calcitRIOL (ROCALTROL) 0.5 MCG capsule   Oral   Take 0.5 mcg by mouth daily.          Marland Kitchen  dipyridamole-aspirin (AGGRENOX) 200-25 MG per 12 hr capsule   Oral   Take 1 capsule by mouth 2 (two) times daily.   180 capsule   3   . furosemide (LASIX) 80 MG tablet   Oral   Take 80 mg by mouth daily.          Marland Kitchen glucose blood (ONE TOUCH ULTRA TEST) test strip   Other   1 each by Other route 3 (three) times daily. And lancets 3/day 250.01   270 each   12   . hydrochlorothiazide (HYDRODIURIL) 25 MG tablet   Oral   Take 1 tablet (25 mg total) by mouth every morning.   90 tablet   0   . HYDROcodone-acetaminophen (NORCO) 10-325 MG per tablet   Oral   Take 1 tablet by mouth every 4 (four) hours as needed for moderate pain.         . Insulin Lispro Prot & Lispro (HUMALOG 75/25 MIX) (75-25) 100 UNIT/ML Kwikpen   Subcutaneous   Inject 11-16 Units into the skin 2 (two) times daily. Inject under the skin twice daily 16 units with breakfast and 11 units with evening meal         . Insulin Pen Needle 31G X 8 MM MISC   Does not apply   1 Device by Does not apply route 2 (two) times daily.   180 each   3   . sildenafil (VIAGRA) 100 MG tablet   Oral   Take 1 tablet (100 mg total) by mouth daily as needed. For sexual activity   5 tablet   11    BP 106/62  Pulse 81  Temp(Src) 95.9 F (35.5 C) (Rectal)  Resp 14  SpO2 % Physical Exam  Nursing note and vitals reviewed. Constitutional: He is oriented to person, place, and time. He appears well-developed and well-nourished. He appears distressed.  HENT:  Head: Normocephalic and atraumatic.  Right Ear: External ear normal.  Left Ear: External ear normal.  Nose: Nose normal.  Mouth/Throat: Oropharynx is clear and moist.  Eyes: Conjunctivae and EOM are normal. Pupils are equal, round, and reactive to light.  Neck: Normal range of motion. Neck supple.  Cardiovascular: Normal rate, regular rhythm, normal heart sounds and intact distal pulses.  Exam reveals no gallop and no friction rub.   No murmur heard. Pulmonary/Chest:  He is in respiratory distress. He has no wheezes. He has rales. He exhibits no tenderness.  Some rales R lung.  L lung, decreased breath sounds.    Abdominal: Soft. Bowel sounds are normal. He exhibits no distension and no mass. There is no tenderness. There is no rebound and no guarding.  Musculoskeletal:  Normal range of motion.  Neurological: He is alert and oriented to person, place, and time.  Skin: Skin is dry.  Extremities cold to touch.    ED Course  Procedures (including critical care time) Labs Review Labs Reviewed  BASIC METABOLIC PANEL - Abnormal; Notable for the following:    CO2 12 (*)    Glucose, Bld 242 (*)    BUN 121 (*)    Creatinine, Ser 4.76 (*)    GFR calc non Af Amer 10 (*)    GFR calc Af Amer 12 (*)    All other components within normal limits  CBC WITH DIFFERENTIAL - Abnormal; Notable for the following:    RDW 16.7 (*)    Platelets 147 (*)    Neutrophils Relative % 88 (*)    Lymphocytes Relative 7 (*)    Lymphs Abs 0.5 (*)    All other components within normal limits  PRO B NATRIURETIC PEPTIDE - Abnormal; Notable for the following:    Pro B Natriuretic peptide (BNP) >70000.0 (*)    All other components within normal limits  BODY FLUID CELL COUNT WITH DIFFERENTIAL - Abnormal; Notable for the following:    Color, Fluid RED (*)    Appearance, Fluid HAZY (*)    All other components within normal limits  LACTATE DEHYDROGENASE, BODY FLUID - Abnormal; Notable for the following:    LD, Fluid 256 (*)    All other components within normal limits  I-STAT TROPOININ, ED - Abnormal; Notable for the following:    Troponin i, poc 1.49 (*)    All other components within normal limits  I-STAT CG4 LACTIC ACID, ED - Abnormal; Notable for the following:    Lactic Acid, Venous 3.86 (*)    All other components within normal limits  I-STAT ARTERIAL BLOOD GAS, ED - Abnormal; Notable for the following:    pH, Arterial 7.012 (*)    pCO2 arterial 52.1 (*)    pO2, Arterial  111.0 (*)    Bicarbonate 13.2 (*)    Acid-base deficit 18.0 (*)    All other components within normal limits  GRAM STAIN  CULTURE, BLOOD (ROUTINE X 2)  CULTURE, BLOOD (ROUTINE X 2)  BODY FLUID CULTURE  CULTURE, BLOOD (ROUTINE X 2)  CULTURE, BLOOD (ROUTINE X 2)  URINE CULTURE  PROTEIN, BODY FLUID  CBC  CREATININE, SERUM  STREP PNEUMONIAE URINARY ANTIGEN  BASIC METABOLIC PANEL  CBC  CYTOLOGY - NON PAP   Imaging Review Dg Chest Port 1 View  06/14/2013   CLINICAL DATA:  Respiratory distress.  Post left thoracentesis.  EXAM: PORTABLE CHEST - 1 VIEW  COMPARISON:  06/13/2013  FINDINGS: Decreasing left pleural effusion since prior study with increasing aeration of the left upper lobe. Continued large left pleural effusion. No pneumothorax.  Cardiomegaly. Right lung is clear. Airspace disease within the left aerated lung, including rounded density in the left apex. This is compatible with the known left apical mass.  IMPRESSION: Decreasing left effusion following thoracentesis. No pneumothorax. Continued large left effusion and left lung airspace disease including left apical mass.  COPD, cardiomegaly.   Electronically Signed   By: Rolm Baptise M.D.   On: 06/14/2013 00:03   Dg Chest Portable 1 View  06/13/2013   CLINICAL DATA:  Respiratory distress.  Lung cancer.  EXAM: PORTABLE CHEST - 1 VIEW  COMPARISON:  05/22/2013  FINDINGS: Complete opacification of the left hemi thorax. This likely is related to a large effusion as there is shift  of mediastinal structures to the right. Hyperinflation of the right lung. Calcifications in the apices bilaterally. No effusion on the right. Mild cardiomegaly.  IMPRESSION: Complete white out of the left hemi thorax, likely related to large effusion as there is shift of mediastinal structures away from the left.  Hyperinflation/COPD changes noted on the right.  Cardiomegaly.   Electronically Signed   By: Rolm Baptise M.D.   On: 06/13/2013 22:15    EKG Interpretation    None       MDM   Final diagnoses:  Respiratory failure  Pleural effusion, left   80 yo M hx of metastatic renal cell carcinoma, HTN, HLD, PAD, CAD, DMII, CVA, presents with CC of dyspnea.    Filed Vitals:   06/14/13 0119  BP: 114/73  Temp: 93.3 F (34.1 C)  Resp: 13   Physical exam as above.  Pt in respiratory distress.  Decreased breath sounds on left, with some rales on R chest.  Pt switched from CPAP, BiPAP, satting 100% on this.  Pt reports improved ease of breathing while on noninvasive ventilation.  CXR shows complete opacification of left lung.    Pt is hypothermic 93.3 rectal temperature.  Labs significan for elevated BNP > 70000, Troponin 1.49, elevated BUN/Cr 121/4.67, Lactic acidosis 3.86.  Blood gas pH 7.012, pCO2 52.1, pO2 111.0, Bicarb 13.2.  Pt started on vanc and cefepime for empiric tx sepsis, possible PNA, in setting of large left sided effusion, which is likely malignant effusion.  Critical care consulted for admission, and L chest thoracentesis to be performed.  Pt and family understand and agree with plan.  Pt's care plan discussed with Dr. Christy Gentles.  Sinda Du, MD       Sinda Du, MD 06/14/13 [phone]

## 2013-06-14 NOTE — ED Provider Notes (Signed)
I have personally seen and examined the patient.  I have discussed the plan of care with the resident.  I have reviewed the documentation on PMH/FH/Soc. History.  I have reviewed the documentation of the resident and agree.  I called report to critical care and discussed this case. Pt appeared to improve/stabilize in the ED but was in significant distress on arrival  CRITICAL CARE Performed by: Sharyon Cable Total critical care time: 37 Critical care time was exclusive of separately billable procedures and treating other patients. Critical care was necessary to treat or prevent imminent or life-threatening deterioration. Critical care was time spent personally by me on the following activities: development of treatment plan with patient and/or surrogate as well as nursing, discussions with consultants, evaluation of patient's response to treatment, examination of patient, obtaining history from patient or surrogate, ordering and performing treatments and interventions, ordering and review of laboratory studies, ordering and review of radiographic studies, pulse oximetry and re-evaluation of patient's condition.   Sharyon Cable, MD 06/14/13 [phone]

## 2013-06-14 NOTE — Progress Notes (Signed)
Name: [name] MRN: [id number] DOB: 1930/06/10    ADMISSION DATE:  06/13/2013 PRIMARY SERVICE: PCCM  CHIEF COMPLAINT:  Weakness   BRIEF PATIENT DESCRIPTION:  53 yom w/ metastatic renal cell CA (lung mets on biopsy 05/22/13) admitted on 2/19 with weakness and progressive SOB. CXR demonstrated complete opacification of the left hemithorax, mediastinal shift (had known pleural effusion and had prior 1.1 liters removed mid January which showed atypical cells). Placed on NIPPV for resp distress. Admitted by PCCM and 1.5 liter thoracentesis performed. Was last seen by oncology on 2/12 - opted for oral tyrosine kinase inhibitor (Votrient), was offered Hospice.  SIGNIFICANT EVENTS / STUDIES:  2/19 left thoracentesis. 1.5 bloody exudate sample  LINES / TUBES:   CULTURES: BC x2 2/20>>> UC 2/20>>> Pleural fluid 2/20>>>  ANTIBIOTICS:   SUBJECTIVE:  Oriented, RASS 0, no overt resp distress @ rest  VITAL SIGNS: Temp:  [93.3 F (34.1 C)-98 F (36.7 C)] 97.6 F (36.4 C) (02/20 1207) Pulse Rate:  [31-81] 68 (02/20 1300) Resp:  [10-28] 26 (02/20 1300) BP: (74-123)/(35-73) 94/56 mmHg (02/20 1300) SpO2:  [80 %-99 %] 97 % (02/20 1300) FiO2 (%):  [100 %] 100 % (02/19 2155) Weight:  [65.3 kg (143 lb 15.4 oz)] 65.3 kg (143 lb 15.4 oz) (02/20 0600) HEMODYNAMICS:   VENTILATOR SETTINGS: Vent Mode:  [-] BIPAP FiO2 (%):  [100 %] 100 % Set Rate:  [15 bmp] 15 bmp INTAKE / OUTPUT: Intake/Output     02/19 0701 - 02/20 0700 02/20 0701 - 02/21 0700   I.V. (mL/kg) 613.3 (9.4) 620 (9.5)   IV Piggyback 150    Total Intake(mL/kg) 763.3 (11.7) 620 (9.5)   Urine (mL/kg/hr)  175 (0.4)   Other 1500    Total Output 1500 175   Net -736.7 +445          PHYSICAL EXAMINATION: General:  Frail, no overt distress Neuro:  DIffusely weak, no focal deficits HEENT: WNL Cardiovascular: IR, rate controlled Lungs:  Diminished BS 1/2 up on L Abdomen:  Soft, non-tender  Ext: symmetric BLE edema    LABS: I have reviewed all of today's lab results. Relevant abnormalities are discussed in the A/P section  CXR: NNF  ASSESSMENT / PLAN:  PULMONARY A: Acute resp failure due to large L pleural effusion - improved Malignant left pleural effusion due to renal cell carcinoma Multiple RCC mets to lung P:   Cont supplemental O2 Have requested IR to place Pleurex cath  CARDIOVASCULAR A: atrial arrhythmia, normal rate P:  No eval or Rx indicated  RENAL A:   CKD AKI Met acidosis P:   Cont HCO3 infusion for now Monitor BMET intermittently Monitor I/Os Correct electrolytes as indicated Not a candidate for HD  GASTROINTESTINAL A:  Protein cal malnutrition  P:   Nutritional consult   HEME/ONC A:  Metastatic renal cell carcinoma w/ mets to lung and pleura  Mild anemia without overt bleeding Midl thrombocytopenia P:  Oncology notified After Pleurex cath placed, plan Hospice (family prefers Hospice facility) Not a candidate for aggressive chemo Trend CBC intermittently LMWH for DVT px  INFECTIOUS A:  Doubt PNA  P:   DC abx Micro and abx as above  ENDOCRINE A: DM II No benefit to tight control here P:   Consider SSI for glu > 205  NEUROLOGIC A:  Generalized weakness  Pain P:   Supportive care Low dose Duragesic patch started 2/20 Morphine dosing interval adjusted  TODAY'S SUMMARY: Family updated. I discussed  options of mgmt. Pt and family wish to proceed with Pleurex cath placement for palliation and placement in a Hospice facility    I have personally obtained a history, examined the patient, evaluated laboratory and imaging results, formulated the assessment and plan and placed orders.   [facility] [place] Pager: [phone]  06/14/2013, 2:01 PM

## 2013-06-14 NOTE — Progress Notes (Signed)
Pt returned from IR from having the aspira drain placed.  VSS and pt alert and oriented with no resp. Difficulty.  Pt did have 620ml drained off in IR.  Pt tolerated procedure well.

## 2013-06-14 NOTE — Progress Notes (Signed)
Pt transferred to 3S06.  Report called to nurse.  Pt's belongings and aspira drain and dressing kits sent with pt - nurse advised.  Spoke with son, Braulio Conte, to advise him of transfer and new room # and location.

## 2013-06-14 NOTE — H&P (Signed)
Chief Complaint: "Shortness of breath." Referring Physician: Dr. Alva Garnet HPI: [name] is an 80 y.o. male with a large left pleural effusion and respiratory distress s/p thoracentesis 1.5 L bloody fluid on 06/13/13. The patient has renal cell carcinoma with metastatic disease to the lung. Cytology from previous thoracentesis 05/07/13 revealed atypical cells. Request has been received for image guided placement of left tunneled pleural catheter for probable malignant effusion. The patient is being discharged today to a hospice medical facility.   Past Medical History:  Past Medical History  Diagnosis Date  . Hypertension   . Anemia   . PAD (peripheral artery disease) 10/10    Angiogram  Dr Trula Slade , severe stenosis left profunda after insertion of aorto-bifemoral  bypass graft-total occulsionof left SFA. Collateral flow through the profunda femoral artery.  Marland Kitchen CAD in native artery 2003    by cath   . Hypercholesteremia   . Thyroid disease   . Thyroiditis, unspecified   . Osteoarthritis of spine   . Olecranon bursitis   . 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 GERD (gastroesophageal reflux disease)   . Diabetes mellitus   . History of colonic polyps   . Iron deficiency anemia, unspecified   . Leg pain   . Numbness   . History of colonoscopy   . Chronic kidney disease   . History of blood transfusion   . Stroke     Past Surgical History:  Past Surgical History  Procedure Laterality Date  . Appendectomy    . Aortobifemoral bypass  2002  . Electrocardiogram  01/12/2006  . Stress cardiolite  04/14/2003  . Pr vein bypass graft,aorto-fem-pop  2002  . Renal artery stent  11/16/2010    Right renal artery  . Eye surgery      Cataract  . Embolectomy  03/08/2012    Procedure: EMBOLECTOMY;  Surgeon: Mal Misty, MD;  Location: [facility] OR;  Service: Vascular;  Laterality: Left;  THROMBECTOMY OF LEFT LIMB OF AORTO-FEMORAL BYPASS GRAFT  . Patch angioplasty   03/08/2012    Procedure: PATCH ANGIOPLASTY;  Surgeon: Mal Misty, MD;  Location: [facility] OR;  Service: Vascular;  Laterality: Left;  Left fropunda endarterectomy with patch angioplasty    Family History:  Family History  Problem Relation Age of Onset  . Kidney disease Sister   . Heart attack Father   . Coronary artery disease Father   . Cancer Mother     Social History:  reports that he has been smoking Cigarettes.  He has a 45 pack-year smoking history. He has never used smokeless tobacco. He reports that he does not drink alcohol or use illicit drugs.  Allergies:  Allergies  Allergen Reactions  . Clopidogrel Bisulfate Hives    Medications:   Medication List    ASK your doctor about these medications       atenolol 25 MG tablet  Commonly known as:  TENORMIN  Take 1 tablet (25 mg total) by mouth every morning.     atorvastatin 40 MG tablet  Commonly known as:  LIPITOR  Take 1 tablet (40 mg total) by mouth daily.     calcitRIOL 0.5 MCG capsule  Commonly known as:  ROCALTROL  Take 0.5 mcg by mouth daily.     dipyridamole-aspirin 200-25 MG per 12 hr capsule  Commonly known as:  AGGRENOX  Take 1 capsule by mouth 2 (two) times daily.     furosemide 80 MG tablet  Commonly known as:  LASIX  Take 80 mg by mouth daily.     glucose blood test strip  Commonly known as:  ONE TOUCH ULTRA TEST  1 each by Other route 3 (three) times daily. And lancets 3/day 250.01     hydrochlorothiazide 25 MG tablet  Commonly known as:  HYDRODIURIL  Take 1 tablet (25 mg total) by mouth every morning.     HYDROcodone-acetaminophen 10-325 MG per tablet  Commonly known as:  NORCO  Take 1 tablet by mouth every 4 (four) hours as needed for moderate pain.     Insulin Lispro Prot & Lispro (75-25) 100 UNIT/ML Kwikpen  Commonly known as:  HUMALOG 75/25 MIX  Inject 11-16 Units into the skin 2 (two) times daily. Inject under the skin twice daily 16 units with breakfast and 11 units with evening  meal     Insulin Pen Needle 31G X 8 MM Misc  1 Device by Does not apply route 2 (two) times daily.     sildenafil 100 MG tablet  Commonly known as:  VIAGRA  Take 1 tablet (100 mg total) by mouth daily as needed. For sexual activity       Please HPI for pertinent positives, otherwise complete 10 system ROS negative.  Physical Exam: BP 94/56  Pulse 68  Temp(Src) 97.6 F (36.4 C) (Oral)  Resp 26  Wt 143 lb 15.4 oz (65.3 kg)  SpO2 97% Body mass index is 18.48 kg/(m^2).  General Appearance:  Alert, cooperative, appears in pain.  Head:  Normocephalic, without obvious abnormality, atraumatic  Neck: Supple, symmetrical, trachea midline  Lungs:   Crackles bilaterally, diminished left base, no w/r/r, respirations unlabored without use of accessory muscles.  Chest Wall:  No tenderness or deformity  Heart:  Regular rate and rhythm, S1, S2 normal, no murmur, rub or gallop.  Abdomen:   Soft, non-tender, non distended, (+) BS  Extremities: Extremities edematous 2+, atraumatic, no cyanosis  Neurologic: Normal affect, no gross deficits.   Results for orders placed during the hospital encounter of 06/13/13 (from the past 48 hour(s))  BASIC METABOLIC PANEL     Status: Abnormal   Collection Time    06/13/13 10:11 PM      Result Value Ref Range   Sodium 145  137 - 147 mEq/L   Potassium 4.8  3.7 - 5.3 mEq/L   Chloride 106  96 - 112 mEq/L   CO2 12 (*) 19 - 32 mEq/L   Glucose, Bld 242 (*) 70 - 99 mg/dL   BUN 121 (*) 6 - 23 mg/dL   Creatinine, Ser 4.76 (*) 0.50 - 1.35 mg/dL   Calcium 8.7  8.4 - 10.5 mg/dL   GFR calc non Af Amer 10 (*) >90 mL/min   GFR calc Af Amer 12 (*) >90 mL/min   Comment: (NOTE)     The eGFR has been calculated using the CKD EPI equation.     This calculation has not been validated in all clinical situations.     eGFR's persistently <90 mL/min signify possible Chronic Kidney     Disease.  CBC WITH DIFFERENTIAL     Status: Abnormal   Collection Time    06/13/13 10:11  PM      Result Value Ref Range   WBC 7.4  4.0 - 10.5 K/uL   RBC 4.76  4.22 - 5.81 MIL/uL   Hemoglobin 13.7  13.0 - 17.0 g/dL   HCT 42.2  39.0 - 52.0 %   MCV 88.7  78.0 -  100.0 fL   MCH 28.8  26.0 - 34.0 pg   MCHC 32.5  30.0 - 36.0 g/dL   RDW 16.7 (*) 11.5 - 15.5 %   Platelets 147 (*) 150 - 400 K/uL   Neutrophils Relative % 88 (*) 43 - 77 %   Neutro Abs 6.5  1.7 - 7.7 K/uL   Lymphocytes Relative 7 (*) 12 - 46 %   Lymphs Abs 0.5 (*) 0.7 - 4.0 K/uL   Monocytes Relative 5  3 - 12 %   Monocytes Absolute 0.3  0.1 - 1.0 K/uL   Eosinophils Relative 0  0 - 5 %   Eosinophils Absolute 0.0  0.0 - 0.7 K/uL   Basophils Relative 0  0 - 1 %   Basophils Absolute 0.0  0.0 - 0.1 K/uL  PRO B NATRIURETIC PEPTIDE     Status: Abnormal   Collection Time    06/13/13 10:11 PM      Result Value Ref Range   Pro B Natriuretic peptide (BNP) >70000.0 (*) 0 - 450 pg/mL  I-STAT TROPOININ, ED     Status: Abnormal   Collection Time    06/13/13 10:20 PM      Result Value Ref Range   Troponin i, poc 1.49 (*) 0.00 - 0.08 ng/mL   Comment NOTIFIED PHYSICIAN     Comment 3            Comment: Due to the release kinetics of cTnI,     a negative result within the first hours     of the onset of symptoms does not rule out     myocardial infarction with certainty.     If myocardial infarction is still suspected,     repeat the test at appropriate intervals.  I-STAT CG4 LACTIC ACID, ED     Status: Abnormal   Collection Time    06/13/13 10:22 PM      Result Value Ref Range   Lactic Acid, Venous 3.86 (*) 0.5 - 2.2 mmol/L  I-STAT ARTERIAL BLOOD GAS, ED     Status: Abnormal   Collection Time    06/13/13 11:13 PM      Result Value Ref Range   pH, Arterial 7.012 (*) 7.350 - 7.450   pCO2 arterial 52.1 (*) 35.0 - 45.0 mmHg   pO2, Arterial 111.0 (*) 80.0 - 100.0 mmHg   Bicarbonate 13.2 (*) 20.0 - 24.0 mEq/L   TCO2 15  0 - 100 mmol/L   O2 Saturation 95.0     Acid-base deficit 18.0 (*) 0.0 - 2.0 mmol/L   Patient  temperature 98.6 F     Collection site RADIAL, ALLEN'S TEST ACCEPTABLE     Drawn by RT     Sample type ARTERIAL     Comment NOTIFIED PHYSICIAN    BODY FLUID CELL COUNT WITH DIFFERENTIAL     Status: Abnormal   Collection Time    06/13/13 11:46 PM      Result Value Ref Range   Fluid Type-FCT FLUID     Comment: PLEURAL     CORRECTED ON 02/20 AT 0000: PREVIOUSLY REPORTED AS Body Fluid   Color, Fluid RED (*) YELLOW   Appearance, Fluid HAZY (*) CLEAR   WBC, Fluid 279  0 - 1000 cu mm   Neutrophil Count, Fluid 7  0 - 25 %   Lymphs, Fluid 33     Monocyte-Macrophage-Serous Fluid 59  50 - 90 %   Eos, Fluid 1    PROTEIN,  BODY FLUID     Status: None   Collection Time    06/13/13 11:46 PM      Result Value Ref Range   Total protein, fluid 4.1     Comment: NO NORMAL RANGE ESTABLISHED FOR THIS TEST   Fluid Type-FTP PLEURAL     Comment: CORRECTED ON 02/20 AT 0000: PREVIOUSLY REPORTED AS Pleural Fld  LACTATE DEHYDROGENASE, BODY FLUID     Status: Abnormal   Collection Time    06/13/13 11:46 PM      Result Value Ref Range   LD, Fluid 256 (*) 3 - 23 U/L   Fluid Type-FLDH FLUID     Comment: PLEURAL     CORRECTED ON 02/19 AT 2358: PREVIOUSLY REPORTED AS Pleural Fld  BODY FLUID CULTURE     Status: None   Collection Time    06/13/13 11:46 PM      Result Value Ref Range   Specimen Description FLUID PLEURAL     Special Requests NONE     Gram Stain       Value: RARE WBC PRESENT,BOTH PMN AND MONONUCLEAR     NO ORGANISMS SEEN     Performed at [facility]     Performed at [facility]   Culture PENDING     Report Status PENDING    GRAM STAIN     Status: None   Collection Time    06/13/13 11:46 PM      Result Value Ref Range   Specimen Description FLUID PLEURAL     Special Requests NONE     Gram Stain       Value: WBC PRESENT,BOTH PMN AND MONONUCLEAR     NO ORGANISMS SEEN     CYTOSPUN   Report Status 06/14/2013 FINAL    PATHOLOGIST SMEAR REVIEW     Status: None    Collection Time    06/13/13 11:46 PM      Result Value Ref Range   Path Review REACTIVE MESOTHELIAL CELLS     Comment: AND FEW INFLAMMATORY CELLS.     Reviewed by Lennox Solders Lyndon Code, M.D.     06/14/13.  MRSA PCR SCREENING     Status: None   Collection Time    06/14/13  2:01 AM      Result Value Ref Range   MRSA by PCR NEGATIVE  NEGATIVE   Comment:            The GeneXpert MRSA Assay (FDA     approved for NASAL specimens     only), is one component of a     comprehensive MRSA colonization     surveillance program. It is not     intended to diagnose MRSA     infection nor to guide or     monitor treatment for     MRSA infections.  GLUCOSE, CAPILLARY     Status: Abnormal   Collection Time    06/14/13  3:37 AM      Result Value Ref Range   Glucose-Capillary 235 (*) 70 - 99 mg/dL   Comment 1 Documented in Chart     Comment 2 Notify RN    BASIC METABOLIC PANEL     Status: Abnormal   Collection Time    06/14/13  6:30 AM      Result Value Ref Range   Sodium 142  137 - 147 mEq/L   Potassium 5.1  3.7 - 5.3 mEq/L   Comment: HEMOLYSIS AT THIS LEVEL  MAY AFFECT RESULT   Chloride 104  96 - 112 mEq/L   CO2 13 (*) 19 - 32 mEq/L   Glucose, Bld 297 (*) 70 - 99 mg/dL   BUN 119 (*) 6 - 23 mg/dL   Creatinine, Ser 4.61 (*) 0.50 - 1.35 mg/dL   Calcium 8.1 (*) 8.4 - 10.5 mg/dL   GFR calc non Af Amer 11 (*) >90 mL/min   GFR calc Af Amer 13 (*) >90 mL/min   Comment: (NOTE)     The eGFR has been calculated using the CKD EPI equation.     This calculation has not been validated in all clinical situations.     eGFR's persistently <90 mL/min signify possible Chronic Kidney     Disease.  CBC     Status: Abnormal   Collection Time    06/14/13  6:30 AM      Result Value Ref Range   WBC 7.5  4.0 - 10.5 K/uL   RBC 4.26  4.22 - 5.81 MIL/uL   Hemoglobin 11.9 (*) 13.0 - 17.0 g/dL   HCT 37.0 (*) 39.0 - 52.0 %   MCV 86.9  78.0 - 100.0 fL   MCH 27.9  26.0 - 34.0 pg   MCHC 32.2  30.0 - 36.0 g/dL   RDW  16.7 (*) 11.5 - 15.5 %   Platelets 113 (*) 150 - 400 K/uL   Comment: PLATELET COUNT CONFIRMED BY SMEAR  GLUCOSE, CAPILLARY     Status: Abnormal   Collection Time    06/14/13  8:12 AM      Result Value Ref Range   Glucose-Capillary 244 (*) 70 - 99 mg/dL   Comment 1 Notify RN     Comment 2 Documented in Chart    GLUCOSE, CAPILLARY     Status: Abnormal   Collection Time    06/14/13 12:06 PM      Result Value Ref Range   Glucose-Capillary 247 (*) 70 - 99 mg/dL   Comment 1 Documented in Chart     Comment 2 Notify RN     Dg Chest Port 1 View  06/14/2013   CLINICAL DATA:  Respiratory distress.  Post left thoracentesis.  EXAM: PORTABLE CHEST - 1 VIEW  COMPARISON:  06/13/2013  FINDINGS: Decreasing left pleural effusion since prior study with increasing aeration of the left upper lobe. Continued large left pleural effusion. No pneumothorax.  Cardiomegaly. Right lung is clear. Airspace disease within the left aerated lung, including rounded density in the left apex. This is compatible with the known left apical mass.  IMPRESSION: Decreasing left effusion following thoracentesis. No pneumothorax. Continued large left effusion and left lung airspace disease including left apical mass.  COPD, cardiomegaly.   Electronically Signed   By: Rolm Baptise M.D.   On: 06/14/2013 00:03   Dg Chest Portable 1 View  06/13/2013   CLINICAL DATA:  Respiratory distress.  Lung cancer.  EXAM: PORTABLE CHEST - 1 VIEW  COMPARISON:  05/22/2013  FINDINGS: Complete opacification of the left hemi thorax. This likely is related to a large effusion as there is shift of mediastinal structures to the right. Hyperinflation of the right lung. Calcifications in the apices bilaterally. No effusion on the right. Mild cardiomegaly.  IMPRESSION: Complete white out of the left hemi thorax, likely related to large effusion as there is shift of mediastinal structures away from the left.  Hyperinflation/COPD changes noted on the right.   Cardiomegaly.   Electronically Signed   By: Lennette Bihari  Dover M.D.   On: 06/13/2013 22:15    Assessment/Plan Renal cell carcinoma with metastatic disease to the lung, biopsy 05/22/13. Large left pleural effusion s/p thoracentesis 06/13/13 Hypotension systolic BP [id number] Respiratory distress, oxygen saturation 97% on 3L Request for image guided left tunneled pleural catheter for hospice care. Patient has been NPO, no blood thinners given, on antibiotics, last dose 0300, will order ancef. Risks and Benefits discussed with the patient and his family. All of the questions were answered, patient and his family are agreeable to proceed. Consent signed and in chart.   Tsosie Billing D PA-C 06/14/2013, 1:58 PM

## 2013-06-14 NOTE — Progress Notes (Signed)
Clinical Social Work Department BRIEF PSYCHOSOCIAL ASSESSMENT 06/14/2013  Patient:  [name], [name]     Account Number:  [id number]     Admit date:  06/13/2013  Clinical Social Worker:  Megan Salon  Date/Time:  06/14/2013 03:11 PM  Referred by:  Physician  Date Referred:  06/14/2013 Referred for  Residential hospice placement   Other Referral:   Interview type:  Family Other interview type:   CSW spoke to patient's wife over the phone    PSYCHOSOCIAL DATA Living Status:  FAMILY Admitted from facility:   Level of care:   Primary support name:  [name] Primary support relationship to patient:  SPOUSE Degree of support available:   Good    CURRENT CONCERNS Current Concerns  Post-Acute Placement   Other Concerns:    SOCIAL WORK ASSESSMENT / PLAN CSW received referral for residential hospice placement for patient. CSW went by room, patient was no in the room at the time, nor was family. CSW then called patient's wife over the phone and explained reason for phone call and explained social work role. Patient's wife states that they have decided on residential hospice and their first choice is [facility]. CSW explained to patient's wife that she may need to pick other choices due to [facility] being full. Patient's wife understood and chose [facility] as her second choice and [facility] as her third. CSW reached out to Erling Conte at [facility], who stated they do not have availability today, but to have social worker follow up over weekend. CSW faxed over clinicals to [place]. Per CM, patient is not ready today.   Assessment/plan status:  Psychosocial Support/Ongoing Assessment of Needs Other assessment/ plan:   Information/referral to community resources:   Hospice Facilities    PATIENT'S/FAMILY'S RESPONSE TO PLAN OF CARE: Patient's wife is agreeable to Residential Hospice and prefers [facility], but is understanding of having to pick  other choices due to availability.       Jeanette Caprice, MSW, [place]

## 2013-06-14 NOTE — Progress Notes (Signed)
NURSING PROGRESS NOTE  [name] [id number] Transfer Data: 06/14/2013 5:42 PM Attending Provider: Rigoberto Noel, MD WUJ:[id number], SEAN, MD Code Status: DNR   [name] is a 80 y.o. male patient transferred from 2S  -No acute distress noted.  -No complaints of shortness of breath.  -No complaints of chest pain.   Cardiac Monitoring: Box # [id number] in place. Cardiac monitor yields:atrial flutter.  Blood pressure 86/53, pulse 68, temperature 97.3 F (36.3 C), temperature source Oral, resp. rate 19, height 6\' 2"  (1.88 m), weight 65.3 kg (143 lb 15.4 oz), SpO2 96.00%.   IV Fluids:  IV in place, occlusive dsg intact without redness, IV cath forearm left, condition patent and no redness normal saline.   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  Past Surgical History:   has past surgical history that includes Appendectomy; aortobifemoral bypass (2002); electrocardiogram (01/12/2006); Stress Cardiolite (04/14/2003); vein bypass graft,aorto-fem-pop (2002); Renal artery stent (11/16/2010); Eye surgery; Embolectomy (03/08/2012); and Patch angioplasty (03/08/2012).  Social History:   reports that he has been smoking Cigarettes.  He has a 45 pack-year smoking history. He has never used smokeless tobacco. He reports that he does not drink alcohol or use illicit drugs.  Skin: NSI  Patient/Family orientated to room. Information packet given to patient/family.  Admission inpatient armband information verified with patient/family to include name and date of birth and placed on patient arm. Side rails up x 2, fall assessment and education completed with patient/family. Patient/family able to verbalize understanding of risk associated with falls and verbalized understanding to call for assistance before getting out of bed. Call light within reach. Patient/family able to voice and demonstrate understanding of unit orientation instructions.    Will continue to evaluate and treat per MD orders.

## 2013-06-14 NOTE — Progress Notes (Signed)
[name], Case Manager, spoke with the pt's wife and one of his son's, [name], concerning the plan to d/c to home with hospice.  After further consideration, the family feels that it will be best for the pt and the family if the pt is admitted to a hospice facility.  The family is concerned for the pt's grandchildren who recently experienced a "traumatic loss" of another family member at home.  [name] is coordinating to have the pt placed in a hospice facility.

## 2013-06-14 NOTE — Procedures (Signed)
L pleurx drain 800 cc serosanguinous fluid No comp

## 2013-06-14 NOTE — Progress Notes (Signed)
eLink Physician-Brief Progress Note Patient Name: [name] DOB: Sep 01, 1930 MRN: [id number]  Date of Service  06/14/2013   HPI/Events of Note   Left chest tunneled pleural drain placed today  eICU Interventions  Orders written for drainage with 1032mL Aspira bag every other day, starting 2/21 AM   Intervention Category Major Interventions: Respiratory failure - evaluation and management  Yuki Brunsman 06/14/2013, 5:41 PM

## 2013-06-14 NOTE — Progress Notes (Signed)
ANTIBIOTIC CONSULT NOTE - INITIAL  Pharmacy Consult for Vancomycin/Levaquin  Indication: rule out pneumonia  Allergies  Allergen Reactions  . Clopidogrel Bisulfate Hives    Patient Measurements: 68.7kg  Vital Signs: Temp: 95.9 F (35.5 C) (02/19 2158) Temp src: Rectal (02/19 2158) BP: 106/62 mmHg (02/20 0027) Pulse Rate: 81 (02/19 2155)  Labs:  Recent Labs  06/13/13 2211  WBC 7.4  HGB 13.7  PLT 147*  CREATININE 4.76*   Medical History: Past Medical History  Diagnosis Date  . Hypertension   . Anemia   . PAD (peripheral artery disease) 10/10    Angiogram  Dr Trula Slade , severe stenosis left profunda after insertion of aorto-bifemoral  bypass graft-total occulsionof left SFA. Collateral flow through the profunda femoral artery.  Marland Kitchen CAD in native artery 2003    by cath   . Hypercholesteremia   . Thyroid disease   . Thyroiditis, unspecified   . Osteoarthritis of spine   . Olecranon bursitis   . 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 GERD (gastroesophageal reflux disease)   . Diabetes mellitus   . History of colonic polyps   . Iron deficiency anemia, unspecified   . Leg pain   . Numbness   . History of colonoscopy   . Chronic kidney disease   . History of blood transfusion   . Stroke    Assessment: 80 y/o M with metastatic renal cell CA to the lungs here with SOB/tachypnea. Noted Scr of 4.76, WBC 7.4, other labs as above. Received vancomycin/cefepime in the ED earlier tonight.  Goal of Therapy:  Vancomycin trough level 15-20 mcg/ml  Plan:  -Check random vancomycin level in the AM on 2/21 (~32 hours post vancomycin) -Levaquin 750 mg IV x 1, then 500 mg IV q48h -Trend WBC, temp, renal function  -Trend clinical status, cultures, imaging  Narda Bonds 06/14/2013,12:43 AM

## 2013-06-14 NOTE — Progress Notes (Signed)
IR aware of request for image guided left tunneled pleural catheter. Case has been reviewed with Dr. Barbie Banner today and given new blood cultures drawn 2/19 and antibiotics started 2/20 for empiric sepsis/PNA, will need to wait until preliminary results are negative prior to placement of catheter.   Tsosie Billing PA-C Interventional Radiology  06/14/13  10:38 AM

## 2013-06-14 NOTE — Progress Notes (Signed)
Pt has been given a total of 5mg  of morphine with unrelieved pain.  Notified Dr. Alva Garnet.  Dr. Alva Garnet has ordered a 14mcg fentanyl patch and increased the morphine order to 4mg .

## 2013-06-14 NOTE — Progress Notes (Signed)
Nutrition Brief Note  Patient identified on the Malnutrition Screening Tool Report. Patient discussed in rounds today; plan is to D/C home with Hospice. No nutrition interventions warranted at this time. Please consult as needed.  Arthur Holms, RD, LDN Pager #: [phone] After-Hours Pager #: [phone]

## 2013-06-14 NOTE — Progress Notes (Signed)
UR Completed.  Vergie Living [phone] 06/14/2013

## 2013-06-14 NOTE — Progress Notes (Signed)
[place] chest performed - Left massive pleural effusion identified as echo free space bounded by chest wall, diaphragm & atelectatic lung. Point marked between rib space 6-7 for thoracentesis. Post thoracentesis, US showed decrease in effusion (residual persists) & no pneumothorax.  Steve Mullally V.

## 2013-06-14 NOTE — Care Management Note (Signed)
  Page 1 of 1   06/14/2013     2:00:07 PM   CARE MANAGEMENT NOTE 06/14/2013  Patient:  [name], [name]   Account Number:  [id number]  Date Initiated:  06/14/2013  Documentation initiated by:  [facility]  Subjective/Objective Assessment:   Admitted large pleural effusion - resp distress rerquiring bipap.  has renal cell ca with mets to lung     Action/Plan:   Anticipated DC Date:  06/15/2013   Anticipated DC Plan:  [place] referral  Clinical Social Worker      DC Planning Services  CM consult      Choice offered to / List presented to:             Status of service:  In process, will continue to follow Medicare Important Message given?   (If response is "NO", the following Medicare IM given date fields will be blank) Date Medicare IM given:   Date Additional Medicare IM given:    Discharge Disposition:    Per UR Regulation:  Reviewed for med. necessity/level of care/duration of stay  If discussed at [place] of Stay Meetings, dates discussed:    Comments:  06-14-13 1:20pm.  Asked to consult on patient as plan for pleurix tube insertion today  by IR then home with hospice. Talked with wife, son and daughter in law.  Initially wanted [facility] and hosp[facility] of [facility] - referral made, however family has changed their mind and now would like to go to a hospice facility.   SW consult placed. Per Dr. Alva Garnet Dr. Earlie Server has been updated.  Family has asked for Dr. Earlie Server to follow or his designee.  Per Dr. Alva Garnet needs to be confirmed with Dr. Earlie Server.

## 2013-06-14 NOTE — ED Notes (Signed)
Critical Care NP at bedside. Speaking to family. Family denies any needs at this time.

## 2013-06-15 ENCOUNTER — Inpatient Hospital Stay (HOSPITAL_COMMUNITY): Payer: Medicare Other

## 2013-06-15 DIAGNOSIS — E119 Type 2 diabetes mellitus without complications: Secondary | ICD-10-CM

## 2013-06-15 DIAGNOSIS — I2510 Atherosclerotic heart disease of native coronary artery without angina pectoris: Secondary | ICD-10-CM

## 2013-06-15 LAB — GLUCOSE, CAPILLARY
GLUCOSE-CAPILLARY: 180 mg/dL — AB (ref 70–99)
Glucose-Capillary: 344 mg/dL — ABNORMAL HIGH (ref 70–99)
Glucose-Capillary: 250 mg/dL — ABNORMAL HIGH (ref 70–99)

## 2013-06-15 LAB — BASIC METABOLIC PANEL
BUN: 116 mg/dL — ABNORMAL HIGH (ref 6–23)
CHLORIDE: 100 meq/L (ref 96–112)
CO2: 18 mEq/L — ABNORMAL LOW (ref 19–32)
CREATININE: 4.48 mg/dL — AB (ref 0.50–1.35)
Calcium: 7.9 mg/dL — ABNORMAL LOW (ref 8.4–10.5)
GFR calc non Af Amer: 11 mL/min — ABNORMAL LOW (ref >90)
GFR, EST AFRICAN AMERICAN: 13 mL/min — AB (ref >90)
Glucose, Bld: 327 mg/dL — ABNORMAL HIGH (ref 70–99)
POTASSIUM: 4.5 meq/L (ref 3.7–5.3)
Sodium: 141 mEq/L (ref 137–147)

## 2013-06-15 LAB — HEMOGLOBIN A1C
HEMOGLOBIN A1C: 8.5 % — AB (ref <5.7)
Mean Plasma Glucose: 197 mg/dL — ABNORMAL HIGH (ref <117)

## 2013-06-15 MED ORDER — BLISTEX MEDICATED EX OINT
TOPICAL_OINTMENT | CUTANEOUS | Status: DC | PRN
  Filled 2013-06-15: qty 10

## 2013-06-15 MED ORDER — INSULIN ASPART 100 UNIT/ML ~~LOC~~ SOLN
0.0000 [IU] | Freq: Three times a day (TID) | SUBCUTANEOUS | Status: DC
  Administered 2013-06-15: 5 [IU] via SUBCUTANEOUS
  Administered 2013-06-15: 11 [IU] via SUBCUTANEOUS
  Administered 2013-06-16: 3 [IU] via SUBCUTANEOUS
  Administered 2013-06-16: 5 [IU] via SUBCUTANEOUS
  Administered 2013-06-16 – 2013-06-17 (×2): 2 [IU] via SUBCUTANEOUS

## 2013-06-15 NOTE — Progress Notes (Signed)
Weekend CSW spoke with RN who reports that patient will be ready to transfer to hospice likely tomorrow Sunday 2/22 or Monday 2/23. CSW will follow up with [facility], [facility], and [facility] regarding availability. CSW will keep MD and RN updated.   Tilden Fossa, MSW, [place] Clinical Social Worker [facility] Emergency Dept. [phone]

## 2013-06-15 NOTE — Progress Notes (Signed)
[facility]- no beds at this time  [facility]- referral faxed  [facility]- bed likely tomorrow 2/22  CSW will follow up with facilities in the am.  Tilden Fossa, MSW, [place] Emergency Dept. [phone]

## 2013-06-15 NOTE — Progress Notes (Signed)
PULMONARY PROGRESS NOTE   Name: [name] MRN: [id number] DOB: 02-Aug-1930    ADMISSION DATE:  06/13/2013 PRIMARY SERVICE: PCCM  BRIEF PATIENT DESCRIPTION:  28 yom w/ metastatic renal cell CA (lung mets on biopsy 05/22/13) admitted on 2/19 with weakness and progressive SOB. CXR demonstrated complete opacification of the left hemithorax, mediastinal shift (had known pleural effusion and had prior 1.1 liters removed mid January which showed atypical cells). Placed on NIPPV for resp distress. Admitted by PCCM and 1.5 liter thoracentesis performed. Was last seen by oncology on 2/12 - opted for oral tyrosine kinase inhibitor (Votrient), was offered Hospice.  SIGNIFICANT EVENTS / STUDIES:  2/19> left thoracentesis. 1.5 bloody exudate sample 2/20> Left PleurX placed by IR  LINES / TUBES: 2/20 Left PleurX catheter  CULTURES: BC x2 2/20>>> UC 2/20>>> Pleural fluid 2/20>>>  ANTIBIOTICS:  Meds Sched:  . enoxaparin (LOVENOX) injection  40 mg Subcutaneous Q24H  . fentaNYL  25 mcg Transdermal Q72H    SUBJECTIVE:  Oriented, RASS 0, no overt resp distress @ rest  VITAL SIGNS: Temp:  [96.5 F (35.8 C)-97.7 F (36.5 C)] 97.7 F (36.5 C) (02/21 0700) Pulse Rate:  [31-74] 66 (02/21 0423) Resp:  [11-26] 11 (02/21 0423) BP: (74-105)/(50-69) 101/62 mmHg (02/21 0423) SpO2:  [95 %-100 %] 100 % (02/21 0423) Weight:  [65.3 kg (143 lb 15.4 oz)] 65.3 kg (143 lb 15.4 oz) (02/20 1700)   INTAKE / OUTPUT: Intake/Output     02/20 0701 - 02/21 0700 02/21 0701 - 02/22 0700   P.O. 60    I.V. (mL/kg) 2500 (38.3)    IV Piggyback     Total Intake(mL/kg) 2560 (39.2)    Urine (mL/kg/hr) 175 (0.1)    Drains 600 (0.4)    Other     Total Output 775     Net +1785          Urine Occurrence 1 x 300 x    PHYSICAL EXAMINATION: General:  Frail, no overt distress Neuro:  DIffusely weak, no focal deficits HEENT: WNL Cardiovascular: IR, rate controlled Lungs:  Diminished BS 1/2 up on L Abdomen:  Soft,  non-tender  Ext: symmetric BLE edema   LABS: I have reviewed all of today's lab results. Relevant abnormalities are discussed in the A/P section  Imaging:   CXR 2/20> IMPRESSION:  Decreasing left effusion following thoracentesis. No pneumothorax. Continued large left effusion and left lung airspace disease including left apical mass. COPD, cardiomegaly.  CXR 2/21> IMPRESSION:  1. Persistent left pleural fluid with new hydro pneumothorax.  2. Status post left PleurX drainage catheter placement.    ASSESSMENT / PLAN:  PULMONARY A: Acute resp failure due to large L pleural effusion - improved Malignant left pleural effusion due to renal cell carcinoma Multiple RCC mets to lung P:   Cont supplemental O2 Have requested IR to place Pleurex cath  CARDIOVASCULAR A: atrial arrhythmia, normal rate Labs 2/19> BNP>70000 P:  No eval or Rx indicated  RENAL A:   CKD AKI  Met acidosis Labs 2/21 w/ BUN=116, Cr=4.48, HCO3=18 P:   Cont HCO3 infusion for now Monitor BMET intermittently Monitor I/Os Correct electrolytes as indicated Not a candidate for HD  GASTROINTESTINAL A:  Protein cal malnutrition  P:   Nutritional consult   HEME/ONC A:  Metastatic renal cell carcinoma w/ mets to lung and pleura  Mild anemia without overt bleeding Midl thrombocytopenia Labs 2/20> Hg=11.9 P:  Oncology notified After Pleurex cath placed, plan Hospice (family prefers Hospice facility)  Not a candidate for aggressive chemo Trend CBC intermittently LMWH for DVT px  INFECTIOUS A:  Doubt PNA  P:   DC abx Micro and abx as above  ENDOCRINE A: DM II No benefit to tight control here Labs 2/21 w/ BS=203-327 P:   Consider SSI for glu > 205  NEUROLOGIC A:  Generalized weakness  Pain P:   Supportive care Low dose Duragesic patch started 2/20 Morphine dosing interval adjusted  DISPOSITION:  Family planning for [place]   [name] Pager: [phone] 06/15/2013, 9:46 AM

## 2013-06-15 NOTE — Progress Notes (Signed)
Subjective: Pt reports less dyspnea since pleurx cath placed and additional 1 liter of fluid drained today  Objective: Vital signs in last 24 hours: Temp:  [96.5 F (35.8 C)-97.7 F (36.5 C)] 97.7 F (36.5 C) (02/21 0700) Pulse Rate:  [52-68] 66 (02/21 0423) Resp:  [11-26] 11 (02/21 0423) BP: (86-105)/(53-69) 101/62 mmHg (02/21 0423) SpO2:  [95 %-100 %] 100 % (02/21 0423) Weight:  [143 lb 15.4 oz (65.3 kg)] 143 lb 15.4 oz (65.3 kg) (02/20 1700) Last BM Date: 06/13/13  Intake/Output from previous day: 02/20 0701 - 02/21 0700 In: 2560 [P.O.:60; I.V.:2500] Out: 775 [Urine:175; Drains:600] Intake/Output this shift: Total I/O In: -  Out: 1000 [Drains:1000]  Left pleurx cath intact, dressing dry, site NT  Lab Results:   Recent Labs  06/13/13 2211 06/14/13 0630  WBC 7.4 7.5  HGB 13.7 11.9*  HCT 42.2 37.0*  PLT 147* 113*   BMET  Recent Labs  06/14/13 0630 06/15/13 0310  NA 142 141  K 5.1 4.5  CL 104 100  CO2 13* 18*  GLUCOSE 297* 327*  BUN 119* 116*  CREATININE 4.61* 4.48*  CALCIUM 8.1* 7.9*   PT/INR No results found for this basename: LABPROT, INR,  in the last 72 hours ABG  Recent Labs  06/13/13 2313  PHART 7.012*  HCO3 13.2*    Studies/Results: Ir Guided Drain W Catheter Placement  06/14/2013   CLINICAL DATA:  Malignant left pleural effusion  EXAM: ABSCESS DRAINAGE; IR ULTRASOUND GUIDANCE TISSUE ABLATION  MEDICATIONS AND MEDICAL HISTORY: Versed 0 mg, Fentanyl 25 mcg.  As antibiotic prophylaxis, Ancef was ordered pre-procedure and administered intravenously within one hour of incision.  ANESTHESIA/SEDATION: Moderate sedation time: 20 minutes  CONTRAST:  None  FLUOROSCOPY TIME:  24 seconds.  PROCEDURE: The procedure, risks, benefits, and alternatives were explained to the patient. Questions regarding the procedure were encouraged and answered. The patient understands and consents to the procedure.  The left thoracic cage was prepped with ChloraPrep in a  sterile fashion, and a sterile drape was applied covering the operative field. A sterile gown and sterile gloves were used for the procedure.  Under sonographic guidance, a micropuncture needle was inserted into the left pleural space via a mid axillary line approach. It was removed over a 018 wire which was up sized to an Amplatz. A peel-away sheath was inserted. A second incision was made 10 cm anterior to the puncture site. The pleuritic strain was then advanced from the second incision and tunneled out the initial puncture incision. It was then fed through the peel-away sheath with removal of the peel-away sheath. The initial incision was closed with a 3-0 Monocryl subcutaneous stitch and a 4 0 Vicryl subcuticular stitch. The second incision was closed with an 0 Prolene pursestring not. Derma bond was applied. 800 cc serosanguineous fluid was aspirated from the left pleural space.  COMPLICATIONS: None  FINDINGS: Images demonstrate a left PleurX drain placement.  IMPRESSION: Successful left PleurX drain placement   Electronically Signed   By: Maryclare Bean M.D.   On: 06/14/2013 16:16   Ir US Guide Bx Asp/drain  06/14/2013   CLINICAL DATA:  Malignant left pleural effusion  EXAM: ABSCESS DRAINAGE; IR ULTRASOUND GUIDANCE TISSUE ABLATION  MEDICATIONS AND MEDICAL HISTORY: Versed 0 mg, Fentanyl 25 mcg.  As antibiotic prophylaxis, Ancef was ordered pre-procedure and administered intravenously within one hour of incision.  ANESTHESIA/SEDATION: Moderate sedation time: 20 minutes  CONTRAST:  None  FLUOROSCOPY TIME:  24 seconds.  PROCEDURE: The  procedure, risks, benefits, and alternatives were explained to the patient. Questions regarding the procedure were encouraged and answered. The patient understands and consents to the procedure.  The left thoracic cage was prepped with ChloraPrep in a sterile fashion, and a sterile drape was applied covering the operative field. A sterile gown and sterile gloves were used for the  procedure.  Under sonographic guidance, a micropuncture needle was inserted into the left pleural space via a mid axillary line approach. It was removed over a 018 wire which was up sized to an Amplatz. A peel-away sheath was inserted. A second incision was made 10 cm anterior to the puncture site. The pleuritic strain was then advanced from the second incision and tunneled out the initial puncture incision. It was then fed through the peel-away sheath with removal of the peel-away sheath. The initial incision was closed with a 3-0 Monocryl subcutaneous stitch and a 4 0 Vicryl subcuticular stitch. The second incision was closed with an 0 Prolene pursestring not. Derma bond was applied. 800 cc serosanguineous fluid was aspirated from the left pleural space.  COMPLICATIONS: None  FINDINGS: Images demonstrate a left PleurX drain placement.  IMPRESSION: Successful left PleurX drain placement   Electronically Signed   By: Maryclare Bean M.D.   On: 06/14/2013 16:16   Dg Chest Port 1 View  06/15/2013   CLINICAL DATA:  Followup left pleural effusion.  EXAM: PORTABLE CHEST - 1 VIEW  COMPARISON:  06/13/2013  FINDINGS: PleurX drainage catheter is identified within the left base. There is a left-sided hydro pneumothorax. This appears new from 06/13/2013. There is mild interstitial edema which is also new from previous exam. .  IMPRESSION: 1. Persistent left pleural fluid with new hydro pneumothorax. 2. Status post left PleurX  drainage catheter placement. Critical Value/emergent results were called by telephone at the time of interpretation on 06/15/2013 at 9:25 AM to Dr. Maryclare Bean , who verbally acknowledged these results.   Electronically Signed   By: Kerby Moors M.D.   On: 06/15/2013 09:25   Dg Chest Port 1 View  06/14/2013   CLINICAL DATA:  Respiratory distress.  Post left thoracentesis.  EXAM: PORTABLE CHEST - 1 VIEW  COMPARISON:  06/13/2013  FINDINGS: Decreasing left pleural effusion since prior study with increasing  aeration of the left upper lobe. Continued large left pleural effusion. No pneumothorax.  Cardiomegaly. Right lung is clear. Airspace disease within the left aerated lung, including rounded density in the left apex. This is compatible with the known left apical mass.  IMPRESSION: Decreasing left effusion following thoracentesis. No pneumothorax. Continued large left effusion and left lung airspace disease including left apical mass.  COPD, cardiomegaly.   Electronically Signed   By: Rolm Baptise M.D.   On: 06/14/2013 00:03   Dg Chest Portable 1 View  06/13/2013   CLINICAL DATA:  Respiratory distress.  Lung cancer.  EXAM: PORTABLE CHEST - 1 VIEW  COMPARISON:  05/22/2013  FINDINGS: Complete opacification of the left hemi thorax. This likely is related to a large effusion as there is shift of mediastinal structures to the right. Hyperinflation of the right lung. Calcifications in the apices bilaterally. No effusion on the right. Mild cardiomegaly.  IMPRESSION: Complete white out of the left hemi thorax, likely related to large effusion as there is shift of mediastinal structures away from the left.  Hyperinflation/COPD changes noted on the right.  Cardiomegaly.   Electronically Signed   By: Rolm Baptise M.D.   On: 06/13/2013 22:15  Anti-infectives: Anti-infectives   Start     Dose/Rate Route Frequency Ordered Stop   06/16/13 0100  levofloxacin (LEVAQUIN) IVPB 500 mg  Status:  Discontinued     500 mg 100 mL/hr over 60 Minutes Intravenous Every 48 hours 06/14/13 0052 06/14/13 0946   06/14/13 1456  ceFAZolin (ANCEF) 2-3 GM-% IVPB SOLR    CommentsDanton Clap   : cabinet override      06/14/13 1456 06/15/13 0314   06/14/13 1430  ceFAZolin (ANCEF) IVPB 2 g/50 mL premix     2 g 100 mL/hr over 30 Minutes Intravenous  Once 06/14/13 1419 06/14/13 1528   06/14/13 0100  levofloxacin (LEVAQUIN) IVPB 750 mg     750 mg 100 mL/hr over 90 Minutes Intravenous  Once 06/14/13 0052 06/14/13 0519   06/13/13 2245   ceFEPIme (MAXIPIME) 1 g in dextrose 5 % 50 mL IVPB     1 g 100 mL/hr over 30 Minutes Intravenous  Once 06/13/13 2236 06/14/13 0044   06/13/13 2245  vancomycin (VANCOCIN) IVPB 1000 mg/200 mL premix     1,000 mg 200 mL/hr over 60 Minutes Intravenous  Once 06/13/13 2237 06/14/13 0010      Assessment/Plan: s/p left pleurx cath placement 2/20 secondary to persistent malignant effusion/met renal cell ca  ; CXR with hydroptx today and films reviewed by Dr. Barbie Banner; cont current tx; total of 1800 cc's pleural fluid removed since placement  LOS: 2 days    Bailen Geffre,D [facility] 06/15/2013

## 2013-06-16 ENCOUNTER — Inpatient Hospital Stay (HOSPITAL_COMMUNITY): Payer: Medicare Other

## 2013-06-16 DIAGNOSIS — N289 Disorder of kidney and ureter, unspecified: Secondary | ICD-10-CM

## 2013-06-16 LAB — GLUCOSE, CAPILLARY
GLUCOSE-CAPILLARY: 137 mg/dL — AB (ref 70–99)
Glucose-Capillary: 222 mg/dL — ABNORMAL HIGH (ref 70–99)
Glucose-Capillary: 256 mg/dL — ABNORMAL HIGH (ref 70–99)
Glucose-Capillary: 179 mg/dL — ABNORMAL HIGH (ref 70–99)
Glucose-Capillary: 128 mg/dL — ABNORMAL HIGH (ref 70–99)

## 2013-06-16 MED ORDER — ENOXAPARIN SODIUM 30 MG/0.3ML ~~LOC~~ SOLN
30.0000 mg | SUBCUTANEOUS | Status: DC
  Administered 2013-06-16: 30 mg via SUBCUTANEOUS
  Filled 2013-06-16 (×2): qty 0.3

## 2013-06-16 NOTE — Progress Notes (Signed)
Weekend CSW contacted [facility] who states that [facility] will have bed available tomorrow. Patient and wife agreeable, accepted bed offer. Patient and wife to complete hospice paperwork with liaison at 9 am. CSW will update RN and MD.  Tilden Fossa, MSW, [place] Emergency Dept. [phone]

## 2013-06-16 NOTE — Progress Notes (Signed)
eLink Physician-Brief Progress Note Patient Name: [name] DOB: Aug 06, 1930 MRN: [id number]  Date of Service  06/16/2013   HPI/Events of Note   Social work message.  Residential hospice bed available tomorrow.  Need DNR form and discharge paperwork before 3pm.  eICU Interventions        Mauri [place], P 06/16/2013, 3:29 PM

## 2013-06-16 NOTE — Progress Notes (Signed)
PULMONARY PROGRESS NOTE   Name: [name] MRN: [id number] DOB: Jul 15, 1930    ADMISSION DATE:  06/13/2013 PRIMARY SERVICE: PCCM  BRIEF PATIENT DESCRIPTION:  41 yom w/ metastatic renal cell CA (lung mets on biopsy 05/22/13) admitted on 2/19 with weakness and progressive SOB. CXR demonstrated complete opacification of the left hemithorax, mediastinal shift (had known pleural effusion and had prior 1.1 liters removed mid January which showed atypical cells). Placed on NIPPV for resp distress. Admitted by PCCM and 1.5 liter thoracentesis performed. Was last seen by oncology on 2/12 - opted for oral tyrosine kinase inhibitor (Votrient), was offered Hospice.  SIGNIFICANT EVENTS / STUDIES:  2/19> left thoracentesis. 1.5 bloody exudate sample 2/20> Left PleurX placed by IR 2/21> additional 1000cc removed from PleurX by IR  LINES / TUBES: 2/20 Left PleurX catheter  CULTURES: BC x2 2/20>>> neg to date UC 2/20>>> not done Pleural fluid 2/20>>> no growth so far   ANTIBIOTICS:  Meds Sched:  . enoxaparin (LOVENOX) injection  40 mg Subcutaneous Q24H  . fentaNYL  25 mcg Transdermal Q72H  . insulin aspart  0-15 Units Subcutaneous TID WC    SUBJECTIVE:  Oriented, RASS 0, no overt resp distress @ rest  VITAL SIGNS: Temp:  [97.5 F (36.4 C)-98.6 F (37 C)] 98.5 F (36.9 C) (02/22 0700) Pulse Rate:  [67-72] 72 (02/22 0342) Resp:  [10-18] 17 (02/22 0342) BP: (76-102)/(46-65) 102/60 mmHg (02/22 0342) SpO2:  [99 %-100 %] 99 % (02/22 0342)   INTAKE / OUTPUT: Intake/Output     02/21 0701 - 02/22 0700 02/22 0701 - 02/23 0700   P.O.     I.V. (mL/kg) 2300 (35.2)    Total Intake(mL/kg) 2300 (35.2)    Urine (mL/kg/hr)     Drains 1000 (0.6)    Total Output 1000     Net +1300          Urine Occurrence 301 x     PHYSICAL EXAMINATION: General:  Frail, no overt distress Neuro:  DIffusely weak, no focal deficits HEENT: WNL Cardiovascular: IR, rate controlled Lungs:  Diminished BS 1/2 up  on L Abdomen:  Soft, non-tender  Ext: symmetric BLE edema   LABS: I have reviewed all of today's lab results. Relevant abnormalities are discussed in the A/P section BS 2/21 >> 180-344 and SSI added...  Imaging:   CXR 2/20> IMPRESSION:  Decreasing left effusion following thoracentesis. No pneumothorax. Continued large left effusion and left lung airspace disease including left apical mass. COPD, cardiomegaly.  CXR 2/21> IMPRESSION:  1. Persistent left pleural fluid with new hydro pneumothorax.  2. Status post left PleurX drainage catheter placement.    ASSESSMENT / PLAN:  PULMONARY A: Acute resp failure due to large L pleural effusion - improved Malignant left pleural effusion due to renal cell carcinoma Multiple RCC mets to lung PleurX cath placement by IR w/ hydropneumothorax P:   Cont supplemental O2 2/20> IR to placed Pleurex cath 2/21> CXR w/ left hydropneumothorax; IR drained an additional 1000cc fluidCXR w/ left hydropneumothorax 2/22> f/u CXR pending  CARDIOVASCULAR A: atrial arrhythmia, normal rate Labs 2/19> BNP>70000 P:  No eval or Rx indicated  RENAL A:   CKD AKI  Met acidosis Labs 2/21 w/ BUN=116, Cr=4.48, HCO3=18 P:   Cont HCO3 infusion for now Monitor BMET intermittently Monitor I/Os Correct electrolytes as indicated Not a candidate for HD  GASTROINTESTINAL A:  Protein cal malnutrition  P:   Nutritional consult   HEME/ONC A:  Metastatic renal cell carcinoma w/  mets to lung and pleura  Mild anemia without overt bleeding Midl thrombocytopenia Labs 2/20> Hg=11.9 P:  Oncology notified After Pleurex cath placed, plan Hospice (family prefers Hospice facility) Not a candidate for aggressive chemo Trend CBC intermittently LMWH for DVT px  INFECTIOUS A:  Doubt PNA  P:   DC abx Micro and abx as above  ENDOCRINE A: DM II >> he was on 75/25 insulin taking 16u AM & 11u PM Labs 2/21 w/ BS=203-327 P:   2/21> SSI Novolog insulin  ordered  NEUROLOGIC A:  Generalized weakness  Pain P:   Supportive care Low dose Duragesic patch started 2/20 Morphine dosing interval adjusted  DISPOSITION:  Family planning for [place]   Nathalie Cavendish [place] Pager: [phone] 06/16/2013, 8:35 AM

## 2013-06-17 LAB — BODY FLUID CULTURE: Culture: NO GROWTH

## 2013-06-17 LAB — GLUCOSE, CAPILLARY: GLUCOSE-CAPILLARY: 176 mg/dL — AB (ref 70–99)

## 2013-06-17 MED ORDER — FENTANYL 25 MCG/HR TD PT72
25.0000 ug | MEDICATED_PATCH | TRANSDERMAL | Status: AC
Start: 2013-06-17 — End: ?

## 2013-06-17 MED ORDER — FENTANYL 25 MCG/HR TD PT72: 25.0000 ug | MEDICATED_PATCH | TRANSDERMAL | Status: DC

## 2013-06-17 MED ORDER — HYDROCODONE-ACETAMINOPHEN 10-325 MG PO TABS
1.0000 | ORAL_TABLET | ORAL | Status: AC | PRN
Start: 2013-06-17 — End: ?

## 2013-06-17 MED ORDER — BLISTEX MEDICATED EX OINT: 1.0000 "application " | TOPICAL_OINTMENT | CUTANEOUS | Status: AC | PRN

## 2013-06-17 MED ORDER — MORPHINE SULFATE 2 MG/ML IJ SOLN: 1.0000 mg | INTRAMUSCULAR | Status: DC | PRN

## 2013-06-17 NOTE — Progress Notes (Signed)
CSW faxed over discharge summary to [phone]. Discharge packet/ [facility] medical necessity form given to RN. RN will call [facility] transportation when patient is ready. CSW met with patient, his wife [place], and his sister-in-law to update them on transfer to [facility]. Patient and family have no questions at this time. CSW signing off, please re-consult if further social work needs arise.   Tilden Fossa, MSW, [place] Clinical Social Worker [facility] Emergency Dept. [phone]

## 2013-06-17 NOTE — Discharge Summary (Signed)
Physician Discharge Summary  Patient ID: [name] MRN: [id number] DOB/AGE: June 23, 1930 80 y.o.  Admit date: 06/13/2013 Discharge date: 06/17/2013    Discharge Diagnoses:  Active Problems:   Acute respiratory failure Metastatic renal cell carcinoma w/ mets to lung and pleura Metabolic acidosis  Acute on chronic renal failure DM   Brief Summary: [name] is a 21 yom w/ metastatic renal cell CA (lung mets on biopsy 05/22/13) admitted on 2/19 with weakness and progressive SOB. Admitted by [facility] 2/19 with acute respiratory failure requiring bipap.  CXR demonstrated complete opacification of the left hemithorax, mediastinal shift (had known pleural effusion and had prior 1.1 liters removed mid January which showed atypical cells). Thoracentesis performed which revealed malignant effusion.  Pleur-x catheter was placed 2/20 for palliation.  Course c/b metabolic acidosis and acute on CKD.  Treated with HCO3 gtt but not candidate for HD.  Heme/onc consulted and pt not candidate for aggressive chemo.  Pt/family opted for comfort care and residential hospice placement.   SIGNIFICANT EVENTS / STUDIES:  2/19> left thoracentesis. 1.5 bloody exudate sample  2/20> Left PleurX placed by IR  2/21> additional 1000cc removed from PleurX by IR   LINES / TUBES:  2/20 Left PleurX catheter   CULTURES:  BC x2 2/20>>> neg to date >>> UC 2/20>>> not done  Pleural fluid 2/20>>>neg to date>>>                                                                     D/c plan by Discharge Diagnosis  Acute Respiratory failure - r/t large L malignant pleural effusion. -- s/p pleur-x placement D/c plan --  Pleur-x drainage every other day and PRN for SOB/comfort  Continue fentanyl patch  PRN vicodin   Acute on CKD  Metabolic acidosis  D/c plan --  D/c HCO3 gtt  Comfort care - no further labs, imaging   Protein calorie malnutrition  D/c plan --  Comfort feeds as rol   DM  D/c plan --  No further  sticks   Filed Vitals:   06/16/13 1940 06/17/13 0019 06/17/13 0421 06/17/13 0800  BP: 102/57 102/56 103/60   Pulse: 72 78 78   Temp: 97.2 F (36.2 C) 98.1 F (36.7 C) 98.1 F (36.7 C) 98.2 F (36.8 C)  TempSrc: Oral Oral Oral Oral  Resp: 7 15 18    Height:      Weight:      SpO2: 100% 97% 90%      Discharge Labs  BMET  Recent Labs Lab 06/13/13 2211 06/14/13 0630 06/15/13 0310  NA 145 142 141  K 4.8 5.1 4.5  CL 106 104 100  CO2 12* 13* 18*  GLUCOSE 242* 297* 327*  BUN 121* 119* 116*  CREATININE 4.76* 4.61* 4.48*  CALCIUM 8.7 8.1* 7.9*     CBC   Recent Labs Lab 06/13/13 2211 06/14/13 0630  HGB 13.7 11.9*  HCT 42.2 37.0*  WBC 7.4 7.5  PLT 147* 113*   Anti-Coagulation No results found for this basename: INR,  in the last 168 hours       Medication List    STOP taking these medications       atenolol 25 MG tablet  Commonly known as:  TENORMIN     atorvastatin 40 MG tablet  Commonly known as:  LIPITOR     calcitRIOL 0.5 MCG capsule  Commonly known as:  ROCALTROL     dipyridamole-aspirin 200-25 MG per 12 hr capsule  Commonly known as:  AGGRENOX     furosemide 80 MG tablet  Commonly known as:  LASIX     glucose blood test strip  Commonly known as:  ONE TOUCH ULTRA TEST     hydrochlorothiazide 25 MG tablet  Commonly known as:  HYDRODIURIL     Insulin Lispro Prot & Lispro (75-25) 100 UNIT/ML Kwikpen  Commonly known as:  HUMALOG 75/25 MIX     Insulin Pen Needle 31G X 8 MM Misc     sildenafil 100 MG tablet  Commonly known as:  VIAGRA      TAKE these medications       fentaNYL 25 MCG/HR patch  Commonly known as:  DURAGESIC - dosed mcg/hr  Place 1 patch (25 mcg total) onto the skin every 3 (three) days.     HYDROcodone-acetaminophen 10-325 MG per tablet  Commonly known as:  NORCO  Take 1 tablet by mouth every 4 (four) hours as needed for moderate pain.     lip balm Oint  Apply 1 application topically as needed for lip care.           Disposition: [facility]   Discharged Condition: [name] has met maximum benefit of inpatient care and is medically stable and cleared for discharge.  Patient is pending follow up as above.      Time spent on disposition:  Greater than 35 minutes.   SignedDarlina Sicilian, NP 06/17/2013  10:54 AM Pager: [phone] or [phone]  *Care during the described time interval was provided by me and/or other providers on the critical care team. I have reviewed this patient's available data, including medical history, events of note, physical examination and test results as part of my evaluation.  Doree Fudge, MD Pulmonary and [place] Pager: [phone]

## 2013-06-17 NOTE — Progress Notes (Signed)
Patient being discharged to [facility] today per MD and family request. Report called to Lona Kettle RN at [facility]. Patient will transport with [facility].  Family at bedside.

## 2013-06-17 NOTE — Consult Note (Signed)
[facility] Liaison: [place] room available for Mr. Hatchel this morning. Meeting with his family at 19 am to complete registration paper work. Please fax discharge summary to [phone] and arrange for Mr. Grief to arrive before noon today. RN please call report to [phone]. Thank you. Erling Conte LCSW [phone]

## 2013-06-18 ENCOUNTER — Telehealth: Payer: Self-pay | Admitting: Medical Oncology

## 2013-06-18 NOTE — Telephone Encounter (Signed)
Faxed prior auth for with added information to [facility]

## 2013-06-20 ENCOUNTER — Telehealth: Payer: Self-pay | Admitting: Medical Oncology

## 2013-06-20 LAB — CULTURE, BLOOD (ROUTINE X 2)
Culture: NO GROWTH
Culture: NO GROWTH
Culture: NO GROWTH
Culture: NO GROWTH

## 2013-06-20 NOTE — Telephone Encounter (Signed)
I called to inquire if pt has heard from [facility] about Votrient delivery. His wife told me [name] is at [facility] on hospice per inpt provider recommendation.

## 2013-06-21 ENCOUNTER — Telehealth: Payer: Self-pay | Admitting: Medical Oncology

## 2013-06-21 NOTE — Telephone Encounter (Signed)
I notified [facility] that pt on hospice and will not need votrient rx.

## 2013-07-24 DEATH — deceased

## 2013-12-25 IMAGING — CR DG CHEST 2V
2 series · 2 of 2 positions shown · non-contrast
Comparison: 06/29/2009

CLINICAL DATA: Preoperative radiograph for RFA

CHEST - 2 VIEW

[w chest pa]
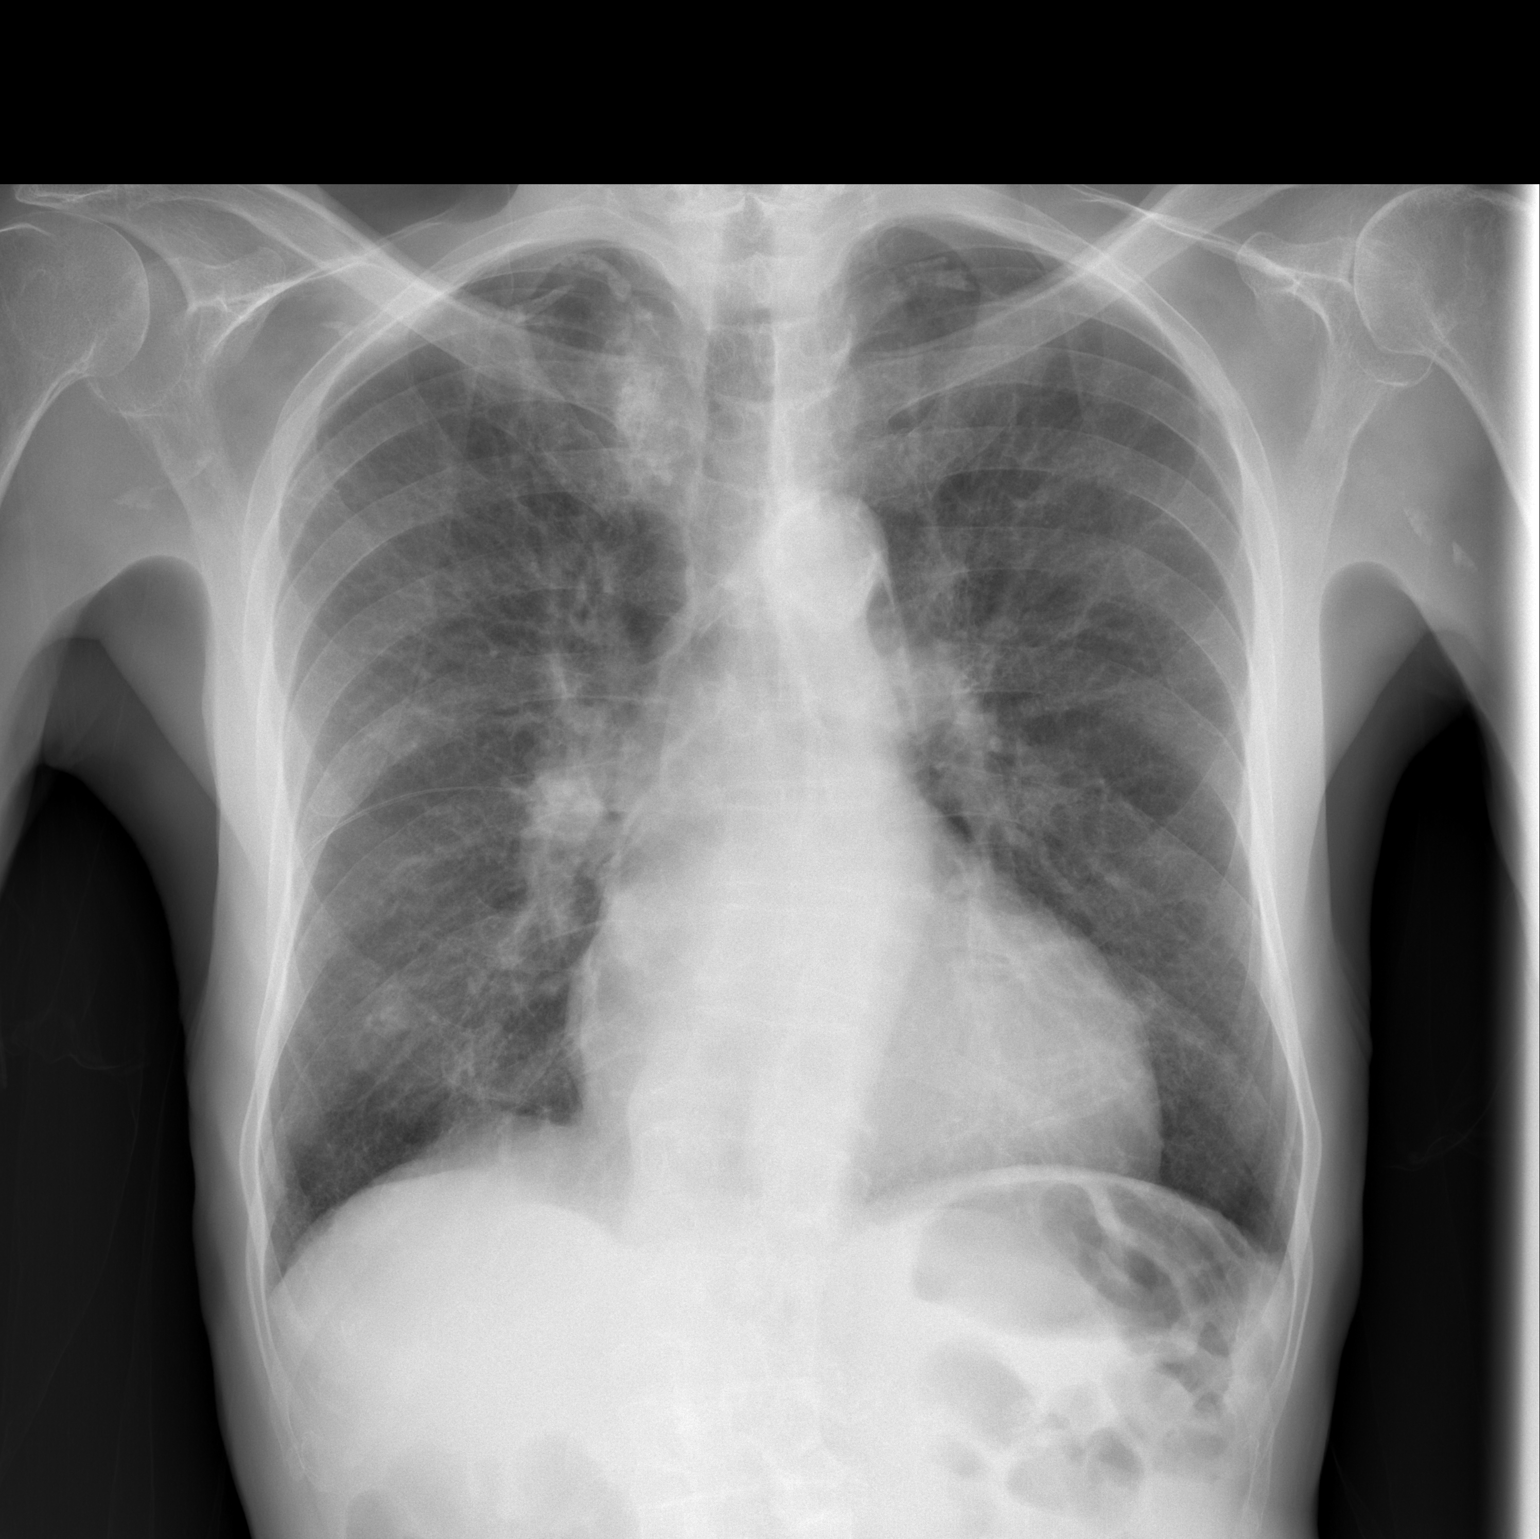

[w chest lat]
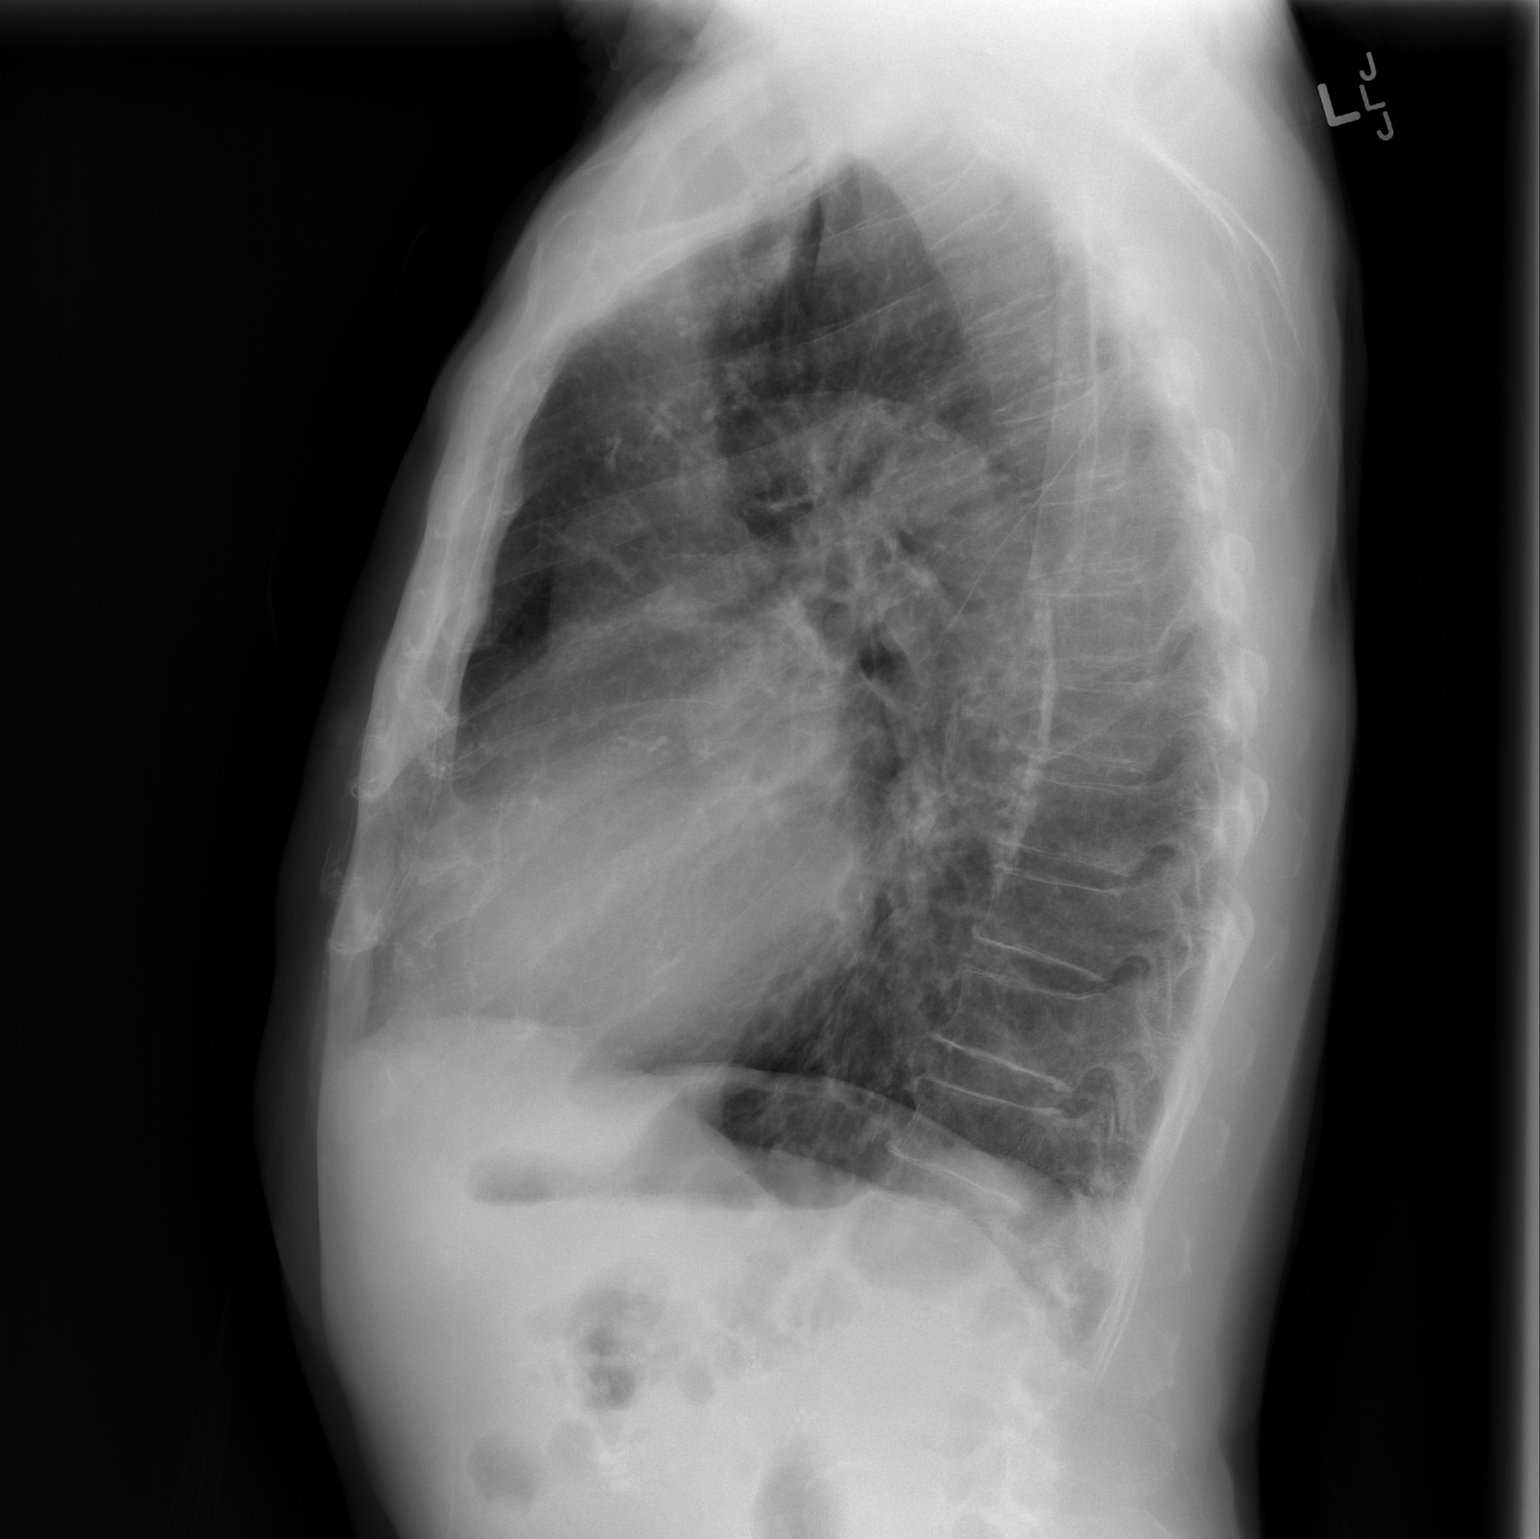

[2 of 2 positions shown; findings below may reference images not displayed]

FINDINGS: Hyperinflation.  Interstitial coarsening.  Apical
scarring.  Aortic and branch vessel atherosclerotic calcification.
No aneurysmal dilatation.  Heart size upper normal to mildly
enlarged.  Mild central vascular prominence is similar to priors.
No acute osseous finding.
IMPRESSION: Chronic changes as above.

No radiographic evidence of acute cardiopulmonary process.

## 2014-01-28 ENCOUNTER — Telehealth: Payer: Self-pay | Admitting: Vascular Surgery

## 2014-01-28 NOTE — Telephone Encounter (Signed)
[place] wife call to let us know he is deceased. He passed on 07/14/13. We need to cancel his appointment for 02/12/14.  Thanks, Ebony Hail

## 2014-02-12 ENCOUNTER — Ambulatory Visit: Payer: Medicare Other | Admitting: Family

## 2014-02-12 ENCOUNTER — Other Ambulatory Visit (HOSPITAL_COMMUNITY): Payer: Medicare Other

## 2014-02-12 ENCOUNTER — Encounter (HOSPITAL_COMMUNITY): Payer: Medicare Other
